# Patient Record
Sex: Female | Born: 1965 | Race: White | Hispanic: No | Marital: Single | Smoking: Never smoker
Health system: Southern US, Community
[De-identification: ages and names within clinical notes are randomized; demographics above are authoritative.]

## PROBLEM LIST (undated history)

## (undated) DIAGNOSIS — F329 Major depressive disorder, single episode, unspecified: Secondary | ICD-10-CM

## (undated) DIAGNOSIS — J329 Chronic sinusitis, unspecified: Secondary | ICD-10-CM

## (undated) DIAGNOSIS — F419 Anxiety disorder, unspecified: Secondary | ICD-10-CM

## (undated) DIAGNOSIS — F32A Depression, unspecified: Secondary | ICD-10-CM

## (undated) DIAGNOSIS — R51 Headache: Secondary | ICD-10-CM

## (undated) DIAGNOSIS — K219 Gastro-esophageal reflux disease without esophagitis: Secondary | ICD-10-CM

## (undated) DIAGNOSIS — Z9109 Other allergy status, other than to drugs and biological substances: Secondary | ICD-10-CM

## (undated) HISTORY — DX: Chronic sinusitis, unspecified: J32.9

## (undated) HISTORY — PX: HIP ARTHROSCOPY W/ LABRAL DEBRIDEMENT: SHX1749

## (undated) HISTORY — PX: OTHER SURGICAL HISTORY: SHX169

## (undated) HISTORY — DX: Headache: R51

## (undated) HISTORY — DX: Gastro-esophageal reflux disease without esophagitis: K21.9

## (undated) HISTORY — DX: Depression, unspecified: F32.A

## (undated) HISTORY — DX: Anxiety disorder, unspecified: F41.9

## (undated) HISTORY — DX: Major depressive disorder, single episode, unspecified: F32.9

---

## 1997-09-13 ENCOUNTER — Emergency Department (HOSPITAL_COMMUNITY): Admission: EM | Admit: 1997-09-13 | Discharge: 1997-09-14 | Payer: Self-pay | Admitting: Internal Medicine

## 1998-05-28 ENCOUNTER — Other Ambulatory Visit: Admission: RE | Admit: 1998-05-28 | Discharge: 1998-05-28 | Payer: Self-pay | Admitting: Obstetrics and Gynecology

## 1998-08-13 ENCOUNTER — Emergency Department (HOSPITAL_COMMUNITY): Admission: EM | Admit: 1998-08-13 | Discharge: 1998-08-13 | Payer: Self-pay | Admitting: Endocrinology

## 1998-08-13 ENCOUNTER — Encounter: Payer: Self-pay | Admitting: Endocrinology

## 1999-09-19 ENCOUNTER — Other Ambulatory Visit: Admission: RE | Admit: 1999-09-19 | Discharge: 1999-09-19 | Payer: Self-pay | Admitting: *Deleted

## 2000-05-21 ENCOUNTER — Emergency Department (HOSPITAL_COMMUNITY): Admission: EM | Admit: 2000-05-21 | Discharge: 2000-05-21 | Payer: Self-pay | Admitting: Emergency Medicine

## 2000-08-23 ENCOUNTER — Encounter: Payer: Self-pay | Admitting: Internal Medicine

## 2000-08-23 ENCOUNTER — Encounter: Admission: RE | Admit: 2000-08-23 | Discharge: 2000-08-23 | Payer: Self-pay | Admitting: Internal Medicine

## 2000-10-25 ENCOUNTER — Encounter: Payer: Self-pay | Admitting: Internal Medicine

## 2000-10-25 ENCOUNTER — Ambulatory Visit (HOSPITAL_COMMUNITY): Admission: RE | Admit: 2000-10-25 | Discharge: 2000-10-25 | Payer: Self-pay | Admitting: Internal Medicine

## 2000-10-26 ENCOUNTER — Ambulatory Visit (HOSPITAL_COMMUNITY): Admission: RE | Admit: 2000-10-26 | Discharge: 2000-10-26 | Payer: Self-pay | Admitting: Internal Medicine

## 2000-10-26 ENCOUNTER — Encounter: Payer: Self-pay | Admitting: Internal Medicine

## 2000-12-07 ENCOUNTER — Other Ambulatory Visit: Admission: RE | Admit: 2000-12-07 | Discharge: 2000-12-07 | Payer: Self-pay | Admitting: Obstetrics and Gynecology

## 2002-01-01 ENCOUNTER — Other Ambulatory Visit: Admission: RE | Admit: 2002-01-01 | Discharge: 2002-01-01 | Payer: Self-pay | Admitting: Obstetrics and Gynecology

## 2002-12-30 ENCOUNTER — Encounter: Admission: RE | Admit: 2002-12-30 | Discharge: 2002-12-30 | Payer: Self-pay | Admitting: Orthopaedic Surgery

## 2003-03-06 ENCOUNTER — Other Ambulatory Visit: Admission: RE | Admit: 2003-03-06 | Discharge: 2003-03-06 | Payer: Self-pay | Admitting: Obstetrics and Gynecology

## 2003-08-16 ENCOUNTER — Emergency Department (HOSPITAL_COMMUNITY): Admission: EM | Admit: 2003-08-16 | Discharge: 2003-08-17 | Payer: Self-pay | Admitting: *Deleted

## 2004-10-13 ENCOUNTER — Ambulatory Visit: Payer: Self-pay | Admitting: Internal Medicine

## 2004-11-22 ENCOUNTER — Ambulatory Visit: Payer: Self-pay | Admitting: Internal Medicine

## 2005-03-29 ENCOUNTER — Ambulatory Visit: Payer: Self-pay | Admitting: Internal Medicine

## 2005-04-03 ENCOUNTER — Encounter: Payer: Self-pay | Admitting: Internal Medicine

## 2005-04-03 ENCOUNTER — Ambulatory Visit: Payer: Self-pay | Admitting: Cardiology

## 2005-10-27 ENCOUNTER — Ambulatory Visit: Payer: Self-pay | Admitting: Internal Medicine

## 2006-01-29 ENCOUNTER — Ambulatory Visit: Payer: Self-pay | Admitting: Family Medicine

## 2006-08-24 ENCOUNTER — Ambulatory Visit: Payer: Self-pay | Admitting: Internal Medicine

## 2006-08-24 DIAGNOSIS — R519 Headache, unspecified: Secondary | ICD-10-CM | POA: Insufficient documentation

## 2006-08-24 DIAGNOSIS — F419 Anxiety disorder, unspecified: Secondary | ICD-10-CM | POA: Insufficient documentation

## 2006-08-24 DIAGNOSIS — F329 Major depressive disorder, single episode, unspecified: Secondary | ICD-10-CM

## 2006-08-24 DIAGNOSIS — R51 Headache: Secondary | ICD-10-CM | POA: Insufficient documentation

## 2006-08-24 DIAGNOSIS — F32A Depression, unspecified: Secondary | ICD-10-CM | POA: Insufficient documentation

## 2006-08-24 DIAGNOSIS — F411 Generalized anxiety disorder: Secondary | ICD-10-CM | POA: Insufficient documentation

## 2006-08-24 DIAGNOSIS — J019 Acute sinusitis, unspecified: Secondary | ICD-10-CM | POA: Insufficient documentation

## 2006-09-18 ENCOUNTER — Telehealth: Payer: Self-pay | Admitting: Internal Medicine

## 2006-09-28 ENCOUNTER — Telehealth: Payer: Self-pay | Admitting: Internal Medicine

## 2006-10-02 DIAGNOSIS — T7840XA Allergy, unspecified, initial encounter: Secondary | ICD-10-CM | POA: Insufficient documentation

## 2006-10-04 ENCOUNTER — Telehealth (INDEPENDENT_AMBULATORY_CARE_PROVIDER_SITE_OTHER): Payer: Self-pay | Admitting: *Deleted

## 2006-11-30 ENCOUNTER — Ambulatory Visit: Payer: Self-pay | Admitting: Internal Medicine

## 2006-12-03 ENCOUNTER — Ambulatory Visit: Payer: Self-pay | Admitting: Internal Medicine

## 2006-12-03 LAB — CONVERTED CEMR LAB
IgE (Immunoglobulin E), Serum: 184.9 intl units/mL — ABNORMAL HIGH (ref 0.0–180.0)
IgE (Immunoglobulin E), Serum: 206.1 intl units/mL — ABNORMAL HIGH (ref 0.0–180.0)

## 2007-01-28 ENCOUNTER — Encounter: Payer: Self-pay | Admitting: Internal Medicine

## 2007-03-05 ENCOUNTER — Ambulatory Visit: Payer: Self-pay | Admitting: Internal Medicine

## 2007-03-06 DIAGNOSIS — K219 Gastro-esophageal reflux disease without esophagitis: Secondary | ICD-10-CM | POA: Insufficient documentation

## 2007-03-06 DIAGNOSIS — L509 Urticaria, unspecified: Secondary | ICD-10-CM | POA: Insufficient documentation

## 2007-04-04 ENCOUNTER — Ambulatory Visit: Payer: Self-pay | Admitting: Internal Medicine

## 2007-04-04 DIAGNOSIS — J310 Chronic rhinitis: Secondary | ICD-10-CM | POA: Insufficient documentation

## 2007-10-04 ENCOUNTER — Ambulatory Visit: Payer: Self-pay | Admitting: Internal Medicine

## 2007-10-04 DIAGNOSIS — L719 Rosacea, unspecified: Secondary | ICD-10-CM | POA: Insufficient documentation

## 2008-08-12 ENCOUNTER — Telehealth: Payer: Self-pay | Admitting: Internal Medicine

## 2008-09-15 ENCOUNTER — Ambulatory Visit: Payer: Self-pay | Admitting: Internal Medicine

## 2008-09-15 DIAGNOSIS — J328 Other chronic sinusitis: Secondary | ICD-10-CM | POA: Insufficient documentation

## 2009-02-18 ENCOUNTER — Ambulatory Visit: Payer: Self-pay | Admitting: Internal Medicine

## 2009-02-18 DIAGNOSIS — H9209 Otalgia, unspecified ear: Secondary | ICD-10-CM | POA: Insufficient documentation

## 2009-02-18 DIAGNOSIS — M1610 Unilateral primary osteoarthritis, unspecified hip: Secondary | ICD-10-CM | POA: Insufficient documentation

## 2009-02-18 DIAGNOSIS — M542 Cervicalgia: Secondary | ICD-10-CM | POA: Insufficient documentation

## 2009-04-02 ENCOUNTER — Telehealth: Payer: Self-pay | Admitting: *Deleted

## 2009-04-12 ENCOUNTER — Telehealth: Payer: Self-pay | Admitting: *Deleted

## 2009-06-03 ENCOUNTER — Encounter: Payer: Self-pay | Admitting: Internal Medicine

## 2010-01-31 ENCOUNTER — Ambulatory Visit
Admission: RE | Admit: 2010-01-31 | Discharge: 2010-01-31 | Payer: Self-pay | Source: Home / Self Care | Attending: Internal Medicine | Admitting: Internal Medicine

## 2010-02-22 NOTE — Assessment & Plan Note (Signed)
Summary: FU 1 MONTH///KP   Visit Type:  Follow-up PCP:  Panosh  Chief Complaint:  follow up.  History of Present Illness: Current Problems:  URTICARIA (ICD-708.9) CORONARY HEART DISEASE (ICD-V17.3) G E R D (ICD-530.81) ALLERGY (ICD-995.3) SINUSITIS, ACUTE (ICD-461.9) HEADACHE (ICD-784.0) DEPRESSION (ICD-311) ANXIETY (ICD-300.00)  Her IgE was elevated 184, but her general and food RAST panels were negative.  Nasal neb neo made her queasy last time and it sounds s if she may have hyperventilated. She's been trying Clarinex-D. She likes an otc phenylephrine product and I discussed decongestants with her. Ears wiull pop. some nasal congestion. No wheeze, no headache.         Current Allergies (reviewed today): ! SULFA CELEBREX (CELECOXIB) SULFAMETHOXAZOLE (SULFAMETHOXAZOLE)  Past Medical History:    Reviewed history from 03/05/2007 and no changes required:       Anxiety       Depression       Headache       Recurrent sinusitis       G E R D   Social History:    Patient never smoked.     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 No Comments Medications reviewed with patient ..................................................................Marland KitchenDarra Lis RMA  April 04, 2007 1:51 PM      Physical Exam  General:     normal appearance and healthy appearing.   Head:     normocephalic and atraumatic Eyes:     PERRLA/EOM intact; conjunctiva and sclera clear Ears:     TMs intact and clear with normal canals cerumen Nose:     no deformity, discharge, inflammation, or lesions Mouth:     no deformity or lesions Neck:     no  masses, thyromegaly, or abnormal cervical nodes Lungs:     clear bilaterally to auscultation and percussion Heart:     regular rate and rhythm, S1, S2 without murmurs, rubs, gallops, or clicks     Impression & Recommendations:  Problem # 1:  SINUSITIS, ACUTE (ICD-461.9) Assessment: Improved  The following medications were removed from the medication list:    Cefdinir 300 Mg Caps (Cefdinir) .[name]... 2 daily  Her updated medication list for this problem includes:    Fluticasone Propionate 50 Mcg/act Susp (Fluticasone propionate) .[name]... Take as directed   Problem # 2:  RHINITIS (ICD-472.0) Assessment: Unchanged allergic rhinitis with eustachian dysfunction and possible autonomic instability Orders: Est. Patient Level III (48546)   Problem # 3:  URTICARIA (ICD-708.9) Assessment: Improved  Medications Added to Medication List This Visit: 1)  Fish Oil  .... Take 1 tablet by mouth once a day 2)  A/b Otic 1.4-5.4 % Soln (Benzocaine-antipyrine) .[name].. 1-2 drops with cotton wick two times a day as needed   Patient Instructions: 1)  Please schedule a follow-up appointment as needed 2)  OK to use Neti Pot, phenylephrine as a decongestant, and a drying antihistamine like Claritin (or Claritin -D). 3)  Try Nasalcrom/  cromolyn nasal spray 4)  Try A-B Otic ear drop for ear ache as needed    Prescriptions: A/B OTIC 1.4-5.4 %  SOLN (BENZOCAINE-ANTIPYRINE) 1-2 drops with cotton wick two times a day as needed  #1 vial x prn   Entered and Authorized by:   Waymon Budge MD   Signed by:   Waymon Budge MD on 04/04/2007   Method used:   Electronically sent to ...       [facility]  [phone]*       [address]       [place], [place]  [address]       Ph: [phone] or [phone]       Fax: [phone]   RxID:   [id number]  ]

## 2010-02-22 NOTE — Progress Notes (Signed)
Summary: constipation and pain  Phone Note Call from Patient   Caller: Patient Call For: Madelin Headings MD Summary of Call: [phone]  Pt had hip surgery last Tuesday, and today is having severe cramping with constipation.  Problems urinating or moving bowels. Last BM was 04/05/2009.  Usually goes 2 x daily.  Feels pressure when urinating......Marland Kitchenfeeling like trickling.  Severe abdominal pain.  Hip surgery was repair of labral tear.  Cannot walk even with walker very well.  Sister is calling the Careers adviser.  Chills and hot flashes.  Does not have thermometer.[name]  Positve rigors.  Talked to surgeon and he wants pt to use a Dulcolax Supp, and call him back.  Advised that we will not get involved unless the surgeon wants [place] to.  Initial call taken by: Lynann Beaver CMA,  April 12, 2009 3:35 PM

## 2010-02-22 NOTE — Assessment & Plan Note (Signed)
Summary: SINUSITIS // RS   Vital Signs:  Patient profile:   45 year old female Menstrual status:  regular LMP:     02/04/2009 Weight:      126 pounds Temp:     98.6 degrees F oral Pulse rate:   60 / minute BP sitting:   110 / 70  (right arm) Cuff size:   regular  Vitals Entered By: Romualdo Bolk, CMA (AAMA) (February 18, 2009 8:21 AM) CC: Sinus pressure, bilateral ear pain x 1 month, rt side of jaw swollen, sinus drainage with vomiting. Coughing and congestion LMP (date): 02/04/2009 LMP - Character: light Menarche (age onset years): 10   Menses interval (days): 28 Menstrual flow (days): 2 Enter LMP: 02/04/2009   History of Present Illness: Terri [name] comesin today for  acute problem of above. BOthe ears are bothering for  a month knife feeling  like  poking in ears.    Ha in middle of head and then ears.  Different than her "sinus HAs"    increasing drainage    an vomited yesterday after coughing.  .   NO HA then .    Has seen Dr Suszanne Conners in past . rec ct but couldn't  afford this.  Has hx of TMJ   used  braces but    doc is no longer in practice. so  still an issues  uses a night  retainer however. Had some ? discharge weepy from ears .  no q tips . ears pop .    having  hip surgery   at [facility].     in March.  ( not a hip replacement) some congential issue and injury from ballet when younger.    Preventive Screening-Counseling & Management  Alcohol-Tobacco     Alcohol drinks/day: <1     Alcohol type: wine     Smoking Status: never  Caffeine-Diet-Exercise     Caffeine use/day: 1     Does Patient Exercise: yes     Type of exercise: running and wts     Times/week: 3  Current Medications (verified): 1)  Junel 1/20 1-20 Mg-Mcg Tabs (Norethindrone Acet-Ethinyl Est) 2)  Fish Oil .... Take 1 Tablet By Mouth Once A Day 3)  Singulair 10 Mg Tabs (Montelukast Sodium) .[name].. 1po Once Daily 4)  Doxycycline Hyclate 100 Mg Tabs (Doxycycline Hyclate) .[name].. 1 By Mouth Two Times A  Day For Perioral Dermatitis 5)  Flonase 50 Mcg/act Susp (Fluticasone Propionate) .... 2 Sprays in Each Nostril Daily 6)  Vitamin D 1000 Unit  Tabs (Cholecalciferol)  Allergies (verified): 1)  ! Sulfa 2)  Celebrex (Celecoxib)  Past History:  Past medical, surgical, family and social histories (including risk factors) reviewed, and no changes noted (except as noted below).  Past Medical History: Reviewed history from 10/04/2007 and no changes required. Anxiety Depression Headache Recurrent sinusitis G E R D    Past History:  Care Management: Dermatology: Thomasene Lot Gynecology: Henderson Cloud ENT: Suszanne Conners  Family History: Reviewed history from 03/05/2007 and no changes required. Coronary Heart Disease  Social History: Reviewed history from 10/04/2007 and no changes required. 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  The patient denies fever, weight loss, weight gain, vision loss, chest pain, prolonged cough, hemoptysis, abdominal pain, melena, hematochezia, severe indigestion/heartburn, transient blindness, abnormal bleeding, enlarged lymph nodes, and angioedema.  left hip   old injury  and  to have hip surgery [facility]  Physical Exam  General:  alert, well-developed, well-nourished, and well-hydrated.  congested  Head:  normocephalic and atraumatic.   Eyes:  vision grossly intact, pupils equal, and pupils round.   Ears:  tmx are grey and seem intaacdt   tortuous canals  no dx minimal wax   no redness  Nose:  no external deformity and no external erythema.  congested  2+  face tender  maxillary area  Mouth:  mild redness     no edema     crepitus  TMJ  area     Neck:  tender  right anterior    tnderness neck no mass but ? if  righ thyroid palpable  no sig adenopathy  Lungs:  Normal respiratory effort, chest expands symmetrically. Lungs are clear to auscultation, no crackles or wheezes.no  dullness.   Heart:  normal rate and regular rhythm.   Pulses:  pulses intact without delay   Extremities:  mild limp    Neurologic:  non focal  Skin:  turgor normal and color normal.   Cervical Nodes:  no posterior cervical adenopathy.  ? shoddy  ac area  Psych:  Oriented X3, normally interactive, and good eye contact.     Impression & Recommendations:  Problem # 1:  OTHER CHRONIC SINUSITIS (ICD-473.8)  ? Acute on chronic   unsure if antibiotic will help  cost a reason to delay ct scan.  and rx with steroid and antibiotic  The following medications were removed from the medication list:    Cefdinir 300 Mg Caps (Cefdinir) .[name]... 1 by mouth two times a day for sinusitis    Hydromet 5-1.5 Mg/62ml Syrp (Hydrocodone-homatropine) .[name]... 1-2 tsp by mouth q 4-6 hours as needed   for cough Her updated medication list for this problem includes:    Doxycycline Hyclate 100 Mg Tabs (Doxycycline hyclate) .[name]... 1 by mouth two times a day for perioral dermatitis    Flonase 50 Mcg/act Susp (Fluticasone propionate) .[name]... 2 sprays in each nostril daily    Cefdinir 300 Mg Caps (Cefdinir) .[name]... 1 by mouth two times a day for sinusitis  Orders: Prescription Created Electronically [phone])  Problem # 2:  EAR PAIN, BILATERAL (ICD-388.70) ? if related   eustachian tube plus TMJ The following medications were removed from the medication list:    Cefdinir 300 Mg Caps (Cefdinir) .[name]... 1 by mouth two times a day for sinusitis Her updated medication list for this problem includes:    Doxycycline Hyclate 100 Mg Tabs (Doxycycline hyclate) .[name]... 1 by mouth two times a day for perioral dermatitis    Cefdinir 300 Mg Caps (Cefdinir) .[name]... 1 by mouth two times a day for sinusitis  Problem # 3:  NECK PAIN (ICD-723.1)  and swelling   ? what swelling was          declining imaging today cause of cost but may need head imaging in future if not better.   Her updated medication list for this problem includes:     Tramadol-acetaminophen 37.5-325 Mg Tabs (Tramadol-acetaminophen) .[name]... 1 by mouth q 8 hours   as needed  pain  Problem # 4:  ARTHRITIS, HIP (ICD-716.95) Assessment: Comment Only  Complete Medication List: 1)  Junel 1/20 1-20 Mg-mcg Tabs (Norethindrone acet-ethinyl est) 2)  Fish Oil  .... Take 1 tablet by mouth once a day 3)  Singulair 10 Mg Tabs (Montelukast sodium) .[name].. 1po once  daily 4)  Doxycycline Hyclate 100 Mg Tabs (Doxycycline hyclate) .[name].. 1 by mouth two times a day for perioral dermatitis 5)  Flonase 50 Mcg/act Susp (Fluticasone propionate) .... 2 sprays in each nostril daily 6)  Vitamin D 1000 Unit Tabs (Cholecalciferol) 7)  Prednisone 20 Mg Tabs (Prednisone) .[name].. 1 by mouth two times a day for 3-5 days or as directed 8)  Cefdinir 300 Mg Caps (Cefdinir) .[name].. 1 by mouth two times a day for sinusitis 9)  Tramadol-acetaminophen 37.5-325 Mg Tabs (Tramadol-acetaminophen) .[name].. 1 by mouth q 8 hours   as needed  pain  Patient Instructions: 1)  take cortisone and antibiotic  .   2)  I think some of your ear pain is from your jaw .    3)  Continue your sprays. 4)  Consider see Dr Suszanne Conners is not better .   Prescriptions: CEFDINIR 300 MG CAPS (CEFDINIR) 1 by mouth two times a day for sinusitis  #20 x 0   Entered and Authorized by:   Madelin Headings MD   Signed by:   Madelin Headings MD on 02/18/2009   Method used:   Electronically to        [facility]  [phone][facility] (retail)       [address] [place], [place]  [address]       Ph: [phone] or [phone]       Fax: [phone]   RxID:   [id number] PREDNISONE 20 MG TABS (PREDNISONE) 1 by mouth two times a day for 3-5 days or as directed  #15 x 0   Entered and Authorized by:   Madelin Headings MD   Signed by:   Madelin Headings MD on 02/18/2009   Method used:   Electronically to        [facility]  [phone]* (retail)       [address] [place], [place]  [address]       Ph: [phone] or [phone]        Fax: [phone]   RxID:   [id number]

## 2010-02-22 NOTE — Letter (Signed)
Summary: [facility]  [facility]   Imported By: Maryln Gottron 06/18/2009 15:29:55  _____________________________________________________________________  External Attachment:    Type:   Image     Comment:   External Document

## 2010-02-22 NOTE — Progress Notes (Signed)
Summary: anxiety  Phone Note Call from Patient   Caller: Patient Call For: Madelin Headings MD Summary of Call: Pt is having hip surgery at [facility] on Tuesday, and is having severe anxiety, and would like a RX.  The surgeons suggested she call her primary MD. [phone] Initial call taken by: Lynann Beaver CMA,  April 02, 2009 1:34 PM  Follow-up for Phone Call        ok to take xanax  .25 mg  1 by mouth three times a day as needed anxiety  . Disp  15 # no refills Follow-up by: Madelin Headings MD,  April 02, 2009 5:16 PM  Additional Follow-up for Phone Call Additional follow up Details #1::        Rx called in and left a message on machine that rx was called in. Additional Follow-up by: Romualdo Bolk, CMA (AAMA),  April 02, 2009 5:23 PM    New/Updated Medications: ALPRAZOLAM 0.25 MG TABS (ALPRAZOLAM) 1 by mouth three times a day as needed Prescriptions: ALPRAZOLAM 0.25 MG TABS (ALPRAZOLAM) 1 by mouth three times a day as needed  #15 x 0   Entered by:   Romualdo Bolk, CMA (AAMA)   Authorized by:   Madelin Headings MD   Signed by:   Romualdo Bolk, CMA (AAMA) on 04/02/2009   Method used:   Telephoned to ...       [facility]  [phone]* (retail)       [address] [place], [place]  [address]       Ph: [phone] or [phone]       Fax: [phone]   RxID:   [id number]

## 2010-02-24 NOTE — Assessment & Plan Note (Signed)
Summary: sinus inf/cjr   Vital Signs:  Patient profile:   45 year old female Menstrual status:  regular Pulse rate:   64 / minute BP sitting:   102 / 80  (left arm)  Vitals Entered By: Kyung Rudd, CMA (January 31, 2010 11:00 AM) CC: ??sinus inf   Primary Care Provider:  Panosh  CC:  ??sinus inf.  History of Present Illness: Patient presents to clinic as a workin for evaluation of possible sinus infection.  Had 2wk h/o URI with improved symptoms. Now with increasing nasal congestion and drainage, cough prod for clear sputum and left maxilary and frontal sinus pain/pressure. +teeth pain. H/o recurrent sinusitis. Denies fever/chills, sick exposure, dyspnea or wheezing. No alleviating or exacerbating factors.  Current Medications (verified): 1)  Junel 1/20 1-20 Mg-Mcg Tabs (Norethindrone Acet-Ethinyl Est) 2)  Fish Oil .... Take 1 Tablet By Mouth Once A Day 3)  Singulair 10 Mg Tabs (Montelukast Sodium) .[name].. 1po Once Daily 4)  Doxycycline Hyclate 100 Mg Tabs (Doxycycline Hyclate) .[name].. 1 By Mouth Two Times A Day For Perioral Dermatitis 5)  Flonase 50 Mcg/act Susp (Fluticasone Propionate) .... 2 Sprays in Each Nostril Daily 6)  Vitamin D 1000 Unit  Tabs (Cholecalciferol)  Allergies (verified): 1)  ! Sulfa 2)  Celebrex (Celecoxib)  Past History:  Past medical, surgical, family and social histories (including risk factors) reviewed, and no changes noted (except as noted below).  Past Medical History: Reviewed history from 10/04/2007 and no changes required. Anxiety Depression Headache Recurrent sinusitis G E R D    Family History: Reviewed history from 03/05/2007 and no changes required. Coronary Heart Disease  Social History: Reviewed history from 10/04/2007 and no changes required. 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 No apparent alopecia or balding. Eyes:  No corneal or conjunctival inflammation noted. EOMI.  Ears:  External ear exam shows no significant lesions or deformities.  Otoscopic examination reveals clear canals, tympanic membranes are intact bilaterally without bulging, retraction, inflammation or discharge. Hearing is grossly normal bilaterally. Nose:  External nasal examination shows no deformity or inflammation. Nasal mucosa are pink and moist without lesions or exudates. Mouth:  Oral mucosa and oropharynx without lesions or exudates.  Teeth in good repair. Neck:  No deformities, masses, or tenderness noted. Lungs:  Normal respiratory effort, chest expands symmetrically. Lungs are clear to auscultation, no crackles or wheezes. Heart:  Normal rate and regular rhythm. S1 and S2 normal without gallop, murmur, click, rub or other extra sounds. Skin:  turgor normal, color normal, and no rashes.     Impression & Recommendations:  Problem # 1:  SINUSITIS, ACUTE (ICD-461.9) Begin course of levaquin to completion. Followup if no improvement or worsening.  The following medications were removed from the medication list:    Cefdinir 300 Mg Caps (Cefdinir) .[name]... 1 by mouth two times a day for sinusitis Her updated medication list for this problem includes:    Doxycycline Hyclate 100 Mg Tabs (Doxycycline hyclate) .[name]... 1 by mouth two times a day for perioral dermatitis    Flonase 50 Mcg/act Susp (Fluticasone propionate) .[name]... 2 sprays in each nostril daily    Levaquin 500 Mg Tabs (Levofloxacin) ..... One by mouth qd  Complete Medication List: 1)  Junel 1/20 1-20 Mg-mcg  Tabs (Norethindrone acet-ethinyl est) 2)  Fish Oil  .... Take 1 tablet by mouth once a day 3)  Singulair 10 Mg Tabs (Montelukast sodium) .[name].. 1po once daily 4)  Doxycycline Hyclate 100 Mg Tabs (Doxycycline hyclate)  .[name].. 1 by mouth two times a day for perioral dermatitis 5)  Flonase 50 Mcg/act Susp (Fluticasone propionate) .... 2 sprays in each nostril daily 6)  Vitamin D 1000 Unit Tabs (Cholecalciferol) 7)  Levaquin 500 Mg Tabs (Levofloxacin) .... One by mouth qd Prescriptions: LEVAQUIN 500 MG TABS (LEVOFLOXACIN) one by mouth qd  #10 x 0   Entered and Authorized by:   Edwyna Perfect MD   Signed by:   Edwyna Perfect MD on 01/31/2010   Method used:   Electronically to        [facility]  [phone]* (retail)       [address] [place], [place]  [address]       Ph: [phone] or [phone]       Fax: [phone]   RxID:   [id number]    Orders Added: 1)  Est. Patient Level III [[address]]

## 2010-06-07 NOTE — Assessment & Plan Note (Signed)
[facility]                             PULMONARY OFFICE NOTE   Terri [name], Terri [name]                      MRN:          [id number]  DATE:11/30/2006                            DOB:          [birth year]    PROBLEM:  Allergy evaluation for this 45 year old woman at the kind  request of Dr. Fabian Sharp. She complains of sinus headaches and nasal  congestion.   HISTORY:  She has been treated for years with a diagnosis of sinus,  but says medicines never helped. Bothersome post-nasal drainage, but she  realizes some of this is definitely reflux. She complains of retro-  orbital headaches and nasal congestion. Allergy work up 6 to 7 years ago  including skin testing tried twice. Both times, she says that she  passed out and never followed through. She tends to be worse in the  spring and fall, especially then, her eyes will itch. Leaf mold and  house dust seem to bother her. She has been using Flonase and saline  lavage which definitely help.   MEDICATIONS:  1. NuvaRing.  2. Flonase 2 sprays each nostril.   Drug intolerant SULFA with hives and throat swelling.   REVIEW OF SYSTEMS:  Cough, sometimes productive. Acid indigestion,  reflux, sore throats, sinus headaches, nasal congestion, itching eyes.   PAST HISTORY:  Hiatal hernia with reflux, recurrent allergies, chronic  sinus headaches, dermographism, SULFA drugs. There is no history of  intolerance to latex, contrast dye, or aspirin. She says that she does  not want to take prednisone again.   SOCIAL HISTORY:  Never smoked. Two alcoholic drinks per week. She is not  married, but has a live-in companion. 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 They use a skin abrasion machine and  one of the grinding dusts bother her airways enough that she wears a  mask to avoid it. She has encasings on her pillow. She has  noticed that  house dust increases nasal congestion.   FAMILY HISTORY:  Father had MI.   REVIEW OF SYSTEMS:  Weight 140 pounds, blood pressure 108/68, pulse 77,  room air saturation 99%. Well developed, well nourished. Conjunctivae  are clear. She is wearing cataracts. Skin shows dermographism. Nasal  airway and throat are clear. Chest is clear. Heart sounds are normal.   IMPRESSION:  1. Complaint of headache which is non-specific.  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  3. Allergic rhinitis. Her history of barriers including especially the      dermographism suggests that we should not skin test her now. This      is reinforced by her history that she had fainted twice when skin      testing was attempted in the past.   PLAN:  We are drawing blood for RAST panels. Schedule return 3 to 6  weeks as able.     Clinton D. Maple Hudson, MD, Tonny Bollman, FACP  Electronically Signed    CDY/MedQ  DD: 11/30/2006  DT: 12/02/2006  Job #: [id number]   cc:   [name]. Fabian Sharp, MD

## 2010-08-17 ENCOUNTER — Encounter: Payer: Self-pay | Admitting: Internal Medicine

## 2010-08-22 ENCOUNTER — Ambulatory Visit: Payer: Self-pay | Admitting: Internal Medicine

## 2010-09-27 ENCOUNTER — Other Ambulatory Visit: Payer: Self-pay | Admitting: Internal Medicine

## 2010-09-28 ENCOUNTER — Encounter: Payer: Self-pay | Admitting: Internal Medicine

## 2010-09-28 ENCOUNTER — Ambulatory Visit (INDEPENDENT_AMBULATORY_CARE_PROVIDER_SITE_OTHER): Payer: BC Managed Care – PPO | Admitting: Internal Medicine

## 2010-09-28 VITALS — BP 100/70 | HR 60 | Temp 98.8°F | Wt 123.0 lb

## 2010-09-28 DIAGNOSIS — J019 Acute sinusitis, unspecified: Secondary | ICD-10-CM

## 2010-09-28 DIAGNOSIS — J329 Chronic sinusitis, unspecified: Secondary | ICD-10-CM | POA: Insufficient documentation

## 2010-09-28 MED ORDER — CEFDINIR 300 MG PO CAPS: 300.0000 mg | ORAL_CAPSULE | Freq: Two times a day (BID) | ORAL | Status: AC

## 2010-09-28 MED ORDER — FLUTICASONE PROPIONATE 50 MCG/ACT NA SUSP: 2.0000 | Freq: Every day | NASAL | Status: DC

## 2010-09-28 NOTE — Progress Notes (Signed)
  Subjective:    Patient ID: Terri [name], female    DOB: 09/03/1965, 45 y.o.   MRN: [id number]  HPI Patient comes in today for an acute problem. She's had a recurrence of her sinus drainage problem. She's been battling this for one to 2 months with saline washes and her Flonase which she is out of it like a refill. She is now having continued green drainage change in the odor of her breath and some anterior face discomfort.     She believes that there is something in her house she is sensitive to because when she leaves the home  for long periods of time she has no flareup of her sinusitis. He has had a couple things tested and is still working on what could be doing this.    Of note she has had a major investigation in the past her pulmonary and ENT and we have managed her recurrent problem over time.   She does get GI upset with Augmentin her last treatment with Omnicef Review of Systems No fever chest pain shortness of breath does have an occasional cough with some bloody nasal drainage.  Past history family history social history reviewed in the electronic medical record.     Objective:   Physical Exam WDWN in nad sounds congested HEENT: Normocephalic ;atraumatic , Eyes;  PERRL, EOMs  Full, lids and conjunctiva clear,,Ears: no deformities, canals nl, TM landmarks normal, Nose: no deformity  quite congested right face slightly tender  Mouth : OP clear without lesion or edema . Units tracks seen Neck no masses  Chest:  Clear to A&P without wheezes rales or rhonchi CV:  S1-S2 no gallops or murmurs peripheral perfusion is normal No clubbing cyanosis or edema      Assessment & Plan:  Recurrent sinusitis subacute and acute  Possibly underlying external trigger allergic versus irritative  . Good about using sinus hygiene and avoidance. It is reasonable to treat her again with antibiotics and she is also leaving town soon.  Refill Flonase . rec antibiotics followup if recurrent and  persistent.  If gets recurrent yeast infection call in so we can prescribe some Diflucan. She apparently has some topical irritation with over the counters.

## 2010-09-28 NOTE — Patient Instructions (Signed)
Call if needed  Treatment for yeast .  Antibiotic  For now.

## 2010-10-03 ENCOUNTER — Telehealth: Payer: Self-pay | Admitting: *Deleted

## 2010-10-03 NOTE — Telephone Encounter (Addendum)
Pt has no fever, or blood in her stools, but stopped Omnicef on Saturday.  Do you still want her to try the Probiotic?  She states the diarrhea was pretty severe and turned to clear liquids by the time she stopped the [facility].

## 2010-10-03 NOTE — Telephone Encounter (Signed)
Pt is having diarrhea from the [facility], and needs to know if she should stop or change antibiotics.

## 2010-10-03 NOTE — Telephone Encounter (Signed)
Per Dr. Fabian Sharp- have pt to just try the probiotic for now then call us in a few days if this doesn't help.

## 2010-10-03 NOTE — Telephone Encounter (Signed)
Pt.notified

## 2010-10-03 NOTE — Telephone Encounter (Signed)
Per Dr. Fabian Sharp- call pt and ask her if she has a fever or any blood in her stools. If not, have her try a probiotic to see if this helps.

## 2010-10-05 ENCOUNTER — Telehealth: Payer: Self-pay | Admitting: *Deleted

## 2010-10-05 MED ORDER — FLUCONAZOLE 150 MG PO TABS: 150.0000 mg | ORAL_TABLET | Freq: Once | ORAL | Status: AC

## 2010-10-05 NOTE — Telephone Encounter (Signed)
Per Dr. Fabian Sharp- ok to do diflucan 150mg  x 1. Rx sent to pharmacy.

## 2010-10-05 NOTE — Telephone Encounter (Signed)
Needs Diflucan for yeast infection caused by recent antibiotics.

## 2010-12-01 ENCOUNTER — Emergency Department (INDEPENDENT_AMBULATORY_CARE_PROVIDER_SITE_OTHER): Payer: BC Managed Care – PPO

## 2010-12-01 ENCOUNTER — Emergency Department (INDEPENDENT_AMBULATORY_CARE_PROVIDER_SITE_OTHER)
Admission: EM | Admit: 2010-12-01 | Discharge: 2010-12-01 | Disposition: A | Discharge status: Home / Self Care | Payer: BC Managed Care – PPO | Source: Home / Self Care | Attending: Family Medicine | Admitting: Family Medicine

## 2010-12-01 ENCOUNTER — Encounter (HOSPITAL_COMMUNITY): Payer: Self-pay | Admitting: *Deleted

## 2010-12-01 ENCOUNTER — Telehealth: Payer: Self-pay | Admitting: *Deleted

## 2010-12-01 DIAGNOSIS — J329 Chronic sinusitis, unspecified: Secondary | ICD-10-CM

## 2010-12-01 DIAGNOSIS — J40 Bronchitis, not specified as acute or chronic: Secondary | ICD-10-CM

## 2010-12-01 HISTORY — DX: Other allergy status, other than to drugs and biological substances: Z91.09

## 2010-12-01 MED ORDER — DOXYCYCLINE HYCLATE 100 MG PO CAPS: 100.0000 mg | ORAL_CAPSULE | Freq: Two times a day (BID) | ORAL | Status: AC

## 2010-12-01 MED ORDER — BENZONATATE 100 MG PO CAPS: ORAL_CAPSULE | ORAL | Status: DC

## 2010-12-01 NOTE — ED Provider Notes (Signed)
History     CSN: [id number] Arrival date & time: 12/01/2010  8:26 PM   First MD Initiated Contact with Patient 12/01/10 2139      Chief Complaint  Patient presents with  . Cough    N/V    (Consider location/radiation/quality/duration/timing/severity/associated sxs/prior treatment) HPI Comments: Pt states she initially had some sinus congestion approx 3 weeks ago but improved after a few days. Then became ill with high fever, cough, nasal congestion and body aches. Fever resolved after 3 days and was beginning to feel better. Then 4 or 5 days ago sinus congestion, post nasal drainage and cough worsened again. Yellow/green nasal mucus. Cough is sometimes productive. Awoke during the night last night feeling like she couldn't breathe because of all the mucus and congestion in her throat. When she began to cough she vomited and then coughed up some blood. She has had some streaks of blood in her sputum today also. No recent travel.   Patient is a 45 y.o. female presenting with cough.  Cough This is a new problem. The current episode started more than 1 week ago. The problem has been gradually worsening. The cough is productive of sputum. There has been no fever. Associated symptoms include rhinorrhea and sore throat. Pertinent negatives include no chest pain, no chills, no ear congestion, no ear pain, no shortness of breath and no wheezing. She has tried nothing for the symptoms. She is not a smoker. Her past medical history does not include asthma.    Past Medical History  Diagnosis Date  . Anxiety   . Depression   . GERD (gastroesophageal reflux disease)   . Headache   . Recurrent sinusitis     ent and pulm eval in past.  . Environmental allergies     Past Surgical History  Procedure Date  . Left hip surgery     [facility]  . Hip arthroscopy w/ labral debridement     Family History  Problem Relation Age of Onset  . Heart disease      fhx    History  Substance Use Topics  .  Smoking status: Never Smoker   . Smokeless tobacco: Not on file  . Alcohol Use: Yes     Occasional use    OB History    Grav Para Term Preterm Abortions TAB SAB Ect Mult Living                  Review of Systems  Constitutional: Negative for fever, chills and fatigue.  HENT: Positive for congestion, sore throat, rhinorrhea, postnasal drip and sinus pressure. Negative for ear pain and sneezing.   Respiratory: Positive for cough. Negative for shortness of breath and wheezing.   Cardiovascular: Negative for chest pain and palpitations.    Allergies  Celecoxib and Sulfonamide derivatives  Home Medications   Current Outpatient Rx  Name Route Sig Dispense Refill  . COENZYME Q10 30 MG PO CAPS Oral Take 30 mg by mouth 3 (three) times daily.      [name] VITAMIN D 1000 UNITS PO CAPS Oral Take 1,000 Units by mouth daily.      [name] VITAMIN B 12 PO Oral Take by mouth.      . OMEGA-3 FATTY ACIDS 1000 MG PO CAPS Oral Take 1 g by mouth daily.      [name] FLUTICASONE PROPIONATE 50 MCG/ACT NA SUSP Nasal Place 2 sprays into the nose daily. 1 Act 12  . NORETHIN ACE-ETH ESTRAD-FE 1-20 MG-MCG PO TABS Oral Take  1 tablet by mouth daily.        BP 112/66  Pulse 83  Temp(Src) 99.1 F (37.3 C) (Oral)  SpO2 97%  LMP 11/17/2010  Physical Exam  Nursing note and vitals reviewed. Constitutional: She appears well-developed and well-nourished. No distress.  HENT:  Head: Normocephalic and atraumatic.  Right Ear: Tympanic membrane, external ear and ear canal normal.  Left Ear: Tympanic membrane, external ear and ear canal normal.  Nose: No rhinorrhea. Right sinus exhibits frontal sinus tenderness. Right sinus exhibits no maxillary sinus tenderness. Left sinus exhibits frontal sinus tenderness. Left sinus exhibits no maxillary sinus tenderness.  Mouth/Throat: Uvula is midline, oropharynx is clear and moist and mucous membranes are normal. No oropharyngeal exudate, posterior oropharyngeal edema or posterior  oropharyngeal erythema.  Neck: Neck supple.  Cardiovascular: Normal rate, regular rhythm and normal heart sounds.   Pulmonary/Chest: Effort normal and breath sounds normal. No respiratory distress.  Lymphadenopathy:    She has no cervical adenopathy.  Neurological: She is alert.  Skin: Skin is warm and dry.  Psychiatric: She has a normal mood and affect.    ED Course  Procedures (including critical care time)  Labs Reviewed - No data to display No results found.   No diagnosis found.    MDM  Xray neg, reviewed by radiologist.        Melody Comas, PA 12/01/10 2250

## 2010-12-01 NOTE — Telephone Encounter (Signed)
Terri [name] call her up as FU tomorrow.

## 2010-12-01 NOTE — Telephone Encounter (Signed)
Coughing up blood with green phlegm, no fever, did have chills last night, she is having sob, chest is tightness. Singulair is not helping. Pt can't come in now. Pt can't come in until 12pm. Pt thought about calling 911 last night because of the sob but waited. Per Dr. Tawanna Cooler- Can go to to [facility] for a cxr then come over here and wait to be seen. It may be an hour or so wait. Or she could go to [facility] to be seen. Pt states that she will go to [facility] to be seen this afternoon.

## 2010-12-01 NOTE — ED Notes (Signed)
Reports had flu-like sxs w/ fever up to 104 x 3 days approx 3 wks ago - got better, then started 8 days ago w/ productive cough w/ "greenish, rusty, brown" sputum.  C/O some nausea; had 1 episode vomiting today.  Feels like "I can't catch my breath".  Denies fevers.  Had episode last night w/ extreme SOB after laying down - felt better after vomiting and spitting up large amt sputum.  Has been taking Nyquil and Alka Seltzer.

## 2010-12-02 ENCOUNTER — Ambulatory Visit: Payer: BC Managed Care – PPO | Admitting: Internal Medicine

## 2010-12-02 NOTE — Telephone Encounter (Signed)
Pt called back saying that she does want to cancel her appt today. Appt cancelled.

## 2010-12-02 NOTE — Telephone Encounter (Signed)
Left message to call back to see how she is doing and if she wants to cancel her 4pm appt today.

## 2010-12-02 NOTE — ED Provider Notes (Signed)
Medical screening examination/treatment/procedure(s) were performed by non-physician practitioner and as supervising physician I was immediately available for consultation/collaboration.  Corrie Mckusick, MD 12/02/10 2127

## 2011-04-18 ENCOUNTER — Telehealth: Payer: Self-pay | Admitting: Internal Medicine

## 2011-04-18 MED ORDER — VALACYCLOVIR HCL 1 G PO TABS: 1000.0000 mg | ORAL_TABLET | Freq: Two times a day (BID) | ORAL | Status: DC

## 2011-04-18 NOTE — Telephone Encounter (Signed)
Left message to call back  

## 2011-04-18 NOTE — Telephone Encounter (Signed)
Addended by: Azucena Freed on: 04/18/2011 05:38 PM   Modules accepted: Orders

## 2011-04-18 NOTE — Telephone Encounter (Signed)
VM:  Pt states she needs an rx for a fever blister and she is not sure if she got it from Dr. Fabian Sharp or another doctor about 5 years ago.  Pt states she thinks the medication is acyclovir.  Pt is going out of town on tomorrow and cannot be seen.  Pt would like for rx to be sent to [facility].

## 2011-04-18 NOTE — Telephone Encounter (Signed)
Patient called stating that she has a fever blister and she would like to have something called into her pharmacy for it, preferably the same one as in the past as she is going out of town tomorrow. Please advise.

## 2011-04-18 NOTE — Telephone Encounter (Signed)
Per Dr. Fabian Sharp ok to send in Valtrex 1000 mg take 2 bid at onset.  Pt aware.

## 2011-08-24 ENCOUNTER — Ambulatory Visit (INDEPENDENT_AMBULATORY_CARE_PROVIDER_SITE_OTHER): Payer: BC Managed Care – PPO | Admitting: Internal Medicine

## 2011-08-24 ENCOUNTER — Encounter: Payer: Self-pay | Admitting: Internal Medicine

## 2011-08-24 VITALS — BP 102/70 | Temp 98.7°F | Wt 123.0 lb

## 2011-08-24 DIAGNOSIS — J019 Acute sinusitis, unspecified: Secondary | ICD-10-CM

## 2011-08-24 MED ORDER — AMOXICILLIN-POT CLAVULANATE 875-125 MG PO TABS: 1.0000 | ORAL_TABLET | Freq: Two times a day (BID) | ORAL | Status: DC

## 2011-08-24 NOTE — Patient Instructions (Signed)
Take your antibiotic as prescribed until ALL of it is gone, but stop if you develop a rash, swelling, or any side effects of the medication.  Contact our office as soon as possible if  there are side effects of the medication.Sinusitis Sinuses are air pockets within the bones of your face. The growth of bacteria within a sinus leads to infection. The infection prevents the sinuses from draining. This infection is called sinusitis. SYMPTOMS   There will be different areas of pain depending on which sinuses have become infected.  The maxillary sinuses often produce pain beneath the eyes.   Frontal sinusitis may cause pain in the middle of the forehead and above the eyes.  Other problems (symptoms) include:  Toothaches.   Colored, pus-like (purulent) drainage from the nose.   Swelling, warmth, and tenderness over the sinus areas may be signs of infection.  TREATMENT   Sinusitis is most often determined by an exam.X-rays may be taken. If x-rays have been taken, make sure you obtain your results or find out how you are to obtain them. Your caregiver may give you medications (antibiotics). These are medications that will help kill the bacteria causing the infection. You may also be given a medication (decongestant) that helps to reduce sinus swelling.   HOME CARE INSTRUCTIONS    Only take over-the-counter or prescription medicines for pain, discomfort, or fever as directed by your caregiver.   Drink extra fluids. Fluids help thin the mucus so your sinuses can drain more easily.   Applying either moist heat or ice packs to the sinus areas may help relieve discomfort.   Use saline nasal sprays to help moisten your sinuses. The sprays can be found at your local drugstore.  SEEK IMMEDIATE MEDICAL CARE IF:  You have a fever.   You have increasing pain, severe headaches, or toothache.   You have nausea, vomiting, or drowsiness.   You develop unusual swelling around the face or trouble seeing.    MAKE SURE YOU:    Understand these instructions.   Will watch your condition.   Will get help right away if you are not doing well or get worse.  Document Released: 01/09/2005 Document Revised: 12/29/2010 Document Reviewed: 08/08/2006 [facility] Patient Information 2012 [place], [place].

## 2011-08-24 NOTE — Progress Notes (Signed)
  Subjective:    Patient ID: Terri [name], female    DOB: 09/08/1965, 46 y.o.   MRN: [id number]  HPI  46 year old patient who does have a history of recurrent sinusitis in the past. She has had worsening sinus congestion and pain intermittently over the past 4 weeks. For the past 2 weeks symptoms have intensified with especially left-sided hemi facial discomfort. There's been no fever. She has been using Flonase but continues to have worsening discomfort. She also describes left ear discomfort    Review of Systems  Constitutional: Negative.   HENT: Positive for ear pain, congestion and sinus pressure. Negative for hearing loss, sore throat, rhinorrhea, dental problem and tinnitus.   Eyes: Negative for pain, discharge and visual disturbance.  Respiratory: Negative for cough and shortness of breath.   Cardiovascular: Negative for chest pain, palpitations and leg swelling.  Gastrointestinal: Negative for nausea, vomiting, abdominal pain, diarrhea, constipation, blood in stool and abdominal distention.  Genitourinary: Negative for dysuria, urgency, frequency, hematuria, flank pain, vaginal bleeding, vaginal discharge, difficulty urinating, vaginal pain and pelvic pain.  Musculoskeletal: Negative for joint swelling, arthralgias and gait problem.  Skin: Negative for rash.  Neurological: Positive for headaches. Negative for dizziness, syncope, speech difficulty, weakness and numbness.  Hematological: Negative for adenopathy.  Psychiatric/Behavioral: Negative for behavioral problems, dysphoric mood and agitation. The patient is not nervous/anxious.        Objective:   Physical Exam  Constitutional: She is oriented to person, place, and time. She appears well-developed and well-nourished.  HENT:  Head: Normocephalic.  Right Ear: External ear normal.  Left Ear: External ear normal.  Mouth/Throat: Oropharynx is clear and moist.       Left maxillary sinus discomfort  Eyes: Conjunctivae and EOM  are normal. Pupils are equal, round, and reactive to light.  Neck: Normal range of motion. Neck supple. No thyromegaly present.  Cardiovascular: Normal rate, regular rhythm, normal heart sounds and intact distal pulses.   Pulmonary/Chest: Effort normal and breath sounds normal.  Abdominal: Soft. Bowel sounds are normal. She exhibits no mass. There is no tenderness.  Musculoskeletal: Normal range of motion.  Lymphadenopathy:    She has no cervical adenopathy.  Neurological: She is alert and oriented to person, place, and time.  Skin: Skin is warm and dry. No rash noted.  Psychiatric: She has a normal mood and affect. Her behavior is normal.          Assessment & Plan:   Acute sinusitis. We'll continue fluticasone. Will treat with Avelox for 7 days; we'll continue tramadol and add decongestants and expectorants

## 2011-11-07 ENCOUNTER — Other Ambulatory Visit: Payer: Self-pay | Admitting: Internal Medicine

## 2011-11-21 ENCOUNTER — Ambulatory Visit: Payer: BC Managed Care – PPO | Admitting: Internal Medicine

## 2011-11-21 ENCOUNTER — Ambulatory Visit (INDEPENDENT_AMBULATORY_CARE_PROVIDER_SITE_OTHER): Payer: BC Managed Care – PPO | Admitting: Internal Medicine

## 2011-11-21 ENCOUNTER — Encounter: Payer: Self-pay | Admitting: Internal Medicine

## 2011-11-21 VITALS — BP 108/70 | HR 112 | Temp 98.6°F | Wt 116.0 lb

## 2011-11-21 DIAGNOSIS — E785 Hyperlipidemia, unspecified: Secondary | ICD-10-CM

## 2011-11-21 DIAGNOSIS — E782 Mixed hyperlipidemia: Secondary | ICD-10-CM | POA: Insufficient documentation

## 2011-11-21 DIAGNOSIS — T887XXA Unspecified adverse effect of drug or medicament, initial encounter: Secondary | ICD-10-CM | POA: Insufficient documentation

## 2011-11-21 DIAGNOSIS — Z8249 Family history of ischemic heart disease and other diseases of the circulatory system: Secondary | ICD-10-CM | POA: Insufficient documentation

## 2011-11-21 DIAGNOSIS — J329 Chronic sinusitis, unspecified: Secondary | ICD-10-CM

## 2011-11-21 MED ORDER — LEVOFLOXACIN 750 MG PO TABS: 750.0000 mg | ORAL_TABLET | Freq: Every day | ORAL | Status: DC

## 2011-11-21 NOTE — Progress Notes (Signed)
Chief Complaint  Patient presents with  . Headache    Ongoing for 1 month  . Nasal Congestion  . Sore Throat    HPI: Patient comes in today for SDA for  new problem evaluation. Onset about a month ago  .  Had steroid inj in back 3 weeks ago and had se of vomiting skin flushing poor sleep . Heart racing.   Helped the sinus slightly better  But still has ha and nose and face pressure.  Poss low grade temp.   Had avelox in august.    After a "bad infection" and worked very well  Has more problems in her residence and didn't bother her when was post op at her sisters residence.  ROS: See pertinent positives and negatives per HPI. Has hx of elevated lipids per gyne asks about this has family hx of HD and had palpitations with the steroid injection  .  No cp sob otherwise   Has numbness in both feet not felt from disc sciatic and to see neuro soon Dr Marjory Lies   Past Medical History  Diagnosis Date  . Anxiety   . Depression   . GERD (gastroesophageal reflux disease)   . Headache   . Recurrent sinusitis     ent and pulm eval in past.  . Environmental allergies     Family History  Problem Relation Age of Onset  . Heart disease      fhx    History   Social History  . Marital Status: Single    Spouse Name: N/A    Number of Children: N/A  . Years of Education: N/A   Social History Main Topics  . Smoking status: Never Smoker   . Smokeless tobacco: None  . Alcohol Use: Yes     Occasional use  . Drug Use: No  . Sexually Active: Yes    Birth Control/ Protection: Pill   Other Topics Concern  . None   Social History Narrative   estheticianNon smoker    Current outpatient prescriptions:B Complex Vitamins (VITAMIN-B COMPLEX PO), Take by mouth., Disp: , Rfl: ;  Calcium Carbonate-Vitamin D (CALCIUM-VITAMIN D) 500-200 MG-UNIT per tablet, Take 1 tablet by mouth 2 (two) times daily with a meal., Disp: , Rfl: ;  co-enzyme Q-10 30 MG capsule, Take 30 mg by mouth 3 (three) times  daily.  , Disp: , Rfl: ;  fish oil-omega-3 fatty acids 1000 MG capsule, Take 1 g by mouth daily.  , Disp: , Rfl:  folic acid (FOLVITE) 1 MG tablet, Take 1 mg by mouth daily., Disp: , Rfl: ;  norethindrone-ethinyl estradiol (JUNEL FE 1/20) 1-20 MG-MCG per tablet, Take 1 tablet by mouth daily.  , Disp: , Rfl: ;  traMADol-acetaminophen (ULTRACET) 37.5-325 MG per tablet, Take 1 tablet by mouth every 6 (six) hours as needed., Disp: , Rfl: ;  vitamin C (ASCORBIC ACID) 500 MG tablet, Take 500 mg by mouth daily., Disp: , Rfl:  fluticasone (FLONASE) 50 MCG/ACT nasal spray, PLACE 2 SPRAYS INTO THE NOSE DAILY., Disp: 16 g, Rfl: 2;  levofloxacin (LEVAQUIN) 750 MG tablet, Take 1 tablet (750 mg total) by mouth daily., Disp: 7 tablet, Rfl: 0;  valACYclovir (VALTREX) 1000 MG tablet, Take 1 tablet (1,000 mg total) by mouth 2 (two) times daily., Disp: 30 tablet, Rfl: 1  EXAM:  Filed Vitals:   11/21/11 1616  BP: 108/70  Pulse: 112  Temp: 98.6 F (37 C)  BP 108/70  Pulse 112  Temp 98.6 F (  37 C) (Oral)  Wt 116 lb (52.617 kg)  SpO2 98%  LMP 11/14/2011   There is no height on file to calculate BMI.  GENERAL: vitals reviewed and listed above, alert, oriented, appears well hydrated and in no acute distress congested   HEENT: atraumatic, conjunttiva clear, no obvious abnormalities on inspection of external nose and ears OP : no lesin edema or exudate  Nares congested and tendern ehtmoid and upper maxillary area    NECK: no obvious masses on inspection palpation  No adenopathy of import   LUNGS: clear to auscultation bilaterally, no wheezes, rales or rhonchi, good air movement  CV: HRRR, no clubbing cyanosis or  peripheral edema nl cap refill   MS: moves all extremities  Non focal motor  PSYCH: pleasant and cooperative, no obvious depression or anxiety reviewed labs scanned from a year ago tc 263 hdl 101 ldl 150 range  ASSESSMENT AND PLAN:  Discussed the following assessment and plan:  1. Recurrent  sinusitis   2. Other and unspecified hyperlipidemia    high hdl 4 ratio  3. Fam hx-ischem heart disease   4. Medication side effect    steroid back injection jittery and palpitations  has many ? Plan ROV after sinus infection better  Adv repeat lipids and thyroid tests. cpx panel  etc   -Patient advised to return or notify  immediately if symptoms worsen or persist or new concerns arise.  Patient Instructions  Ok to try the levaquin for 7 days if  persistent or progressive may need reevaluation .      arrange for fu about above    Lorretta Harp

## 2011-11-21 NOTE — Patient Instructions (Signed)
Ok to try the levaquin for 7 days if  persistent or progressive may need reevaluation .

## 2012-01-02 ENCOUNTER — Ambulatory Visit (INDEPENDENT_AMBULATORY_CARE_PROVIDER_SITE_OTHER): Payer: BC Managed Care – PPO | Admitting: Internal Medicine

## 2012-01-02 ENCOUNTER — Encounter: Payer: Self-pay | Admitting: Internal Medicine

## 2012-01-02 VITALS — BP 114/70 | HR 77 | Temp 98.3°F | Wt 116.0 lb

## 2012-01-02 DIAGNOSIS — M5430 Sciatica, unspecified side: Secondary | ICD-10-CM | POA: Insufficient documentation

## 2012-01-02 DIAGNOSIS — F4323 Adjustment disorder with mixed anxiety and depressed mood: Secondary | ICD-10-CM | POA: Insufficient documentation

## 2012-01-02 MED ORDER — BUPROPION HCL ER (XL) 150 MG PO TB24: 150.0000 mg | ORAL_TABLET | Freq: Every day | ORAL | Status: DC

## 2012-01-02 MED ORDER — ALPRAZOLAM 0.25 MG PO TABS: 0.2500 mg | ORAL_TABLET | Freq: Three times a day (TID) | ORAL | Status: DC | PRN

## 2012-01-02 NOTE — Progress Notes (Signed)
Chief Complaint  Patient presents with  . Follow-up    Has been seeing a counselor and has been told she is "dangerously depressed."  Needs to discuss medications.     HPI: Comes in  "follow up" however really heer for depression problem . Has been having depressed sx for the past months and in counseling for poss 5 months but still havine anhedonia and hopelessness.  Remote hx of depression other times with mned x 2 when dad died at age 46 and took effexor and had very hard time getting off  Also when had divorce years ago.  Is beginning to have anxiety attacks with the depression  . Doesn't want med but advised as options by counselor.   yaking 1/4 xanax  As needed for now.  No tob min caffeine and etoh some but not a lot .  Trying to do all the right thinks .  Battling non discogenic sciatica takes tramadol once about 3 d per week to get through . Stays as active as possible  ROS: See pertinent positives and negatives per HPI.  Past Medical History  Diagnosis Date  . Anxiety   . Depression   . GERD (gastroesophageal reflux disease)   . Headache   . Recurrent sinusitis     ent and pulm eval in past.  . Environmental allergies     Family History  Problem Relation Age of Onset  . Heart disease      fhx    History   Social History  . Marital Status: Single    Spouse Name: N/A    Number of Children: N/A  . Years of Education: N/A   Social History Main Topics  . Smoking status: Never Smoker   . Smokeless tobacco: None  . Alcohol Use: Yes     Comment: Occasional use  . Drug Use: No  . Sexually Active: Yes    Birth Control/ Protection: Pill   Other Topics Concern  . None   Social History Narrative   estheticianNon smoker    Outpatient Encounter Prescriptions as of 01/02/2012  Medication Sig Dispense Refill  . B Complex Vitamins (VITAMIN-B COMPLEX PO) Take by mouth.      . Calcium Carbonate-Vitamin D (CALCIUM-VITAMIN D) 500-200 MG-UNIT per tablet Take 1 tablet by  mouth 2 (two) times daily with a meal.      . co-enzyme Q-10 30 MG capsule Take 30 mg by mouth 3 (three) times daily.        . fish oil-omega-3 fatty acids 1000 MG capsule Take 1 g by mouth daily.        . fluticasone (FLONASE) 50 MCG/ACT nasal spray PLACE 2 SPRAYS INTO THE NOSE DAILY.  16 g  2  . folic acid (FOLVITE) 1 MG tablet Take 1 mg by mouth daily.      [name] LYSINE PO Take 1,000 mg by mouth daily. for fever blisters.      . norethindrone-ethinyl estradiol (JUNEL FE 1/20) 1-20 MG-MCG per tablet Take 1 tablet by mouth daily.        . traMADol-acetaminophen (ULTRACET) 37.5-325 MG per tablet Take 1 tablet by mouth every 6 (six) hours as needed.      . valACYclovir (VALTREX) 1000 MG tablet Take 1 tablet (1,000 mg total) by mouth 2 (two) times daily.  30 tablet  1  . vitamin C (ASCORBIC ACID) 500 MG tablet Take 500 mg by mouth daily.      [name] ALPRAZolam (XANAX) 0.25 MG tablet  Take 1 tablet (0.25 mg total) by mouth 3 (three) times daily as needed for anxiety.  30 tablet  0  . buPROPion (WELLBUTRIN XL) 150 MG 24 hr tablet Take 1 tablet (150 mg total) by mouth daily. Increase to 2 q am after a week  60 tablet  1  . [DISCONTINUED] levofloxacin (LEVAQUIN) 750 MG tablet Take 1 tablet (750 mg total) by mouth daily.  7 tablet  0    EXAM:  BP 114/70  Pulse 77  Temp 98.3 F (36.8 C) (Oral)  Wt 116 lb (52.617 kg)  SpO2 97%  LMP 12/12/2011  There is no height on file to calculate BMI.  GENERAL: vitals reviewed and listed above, alert, oriented, appears well hydrated and in no acute distress mildly distressed    HEENT: atraumatic, conjunctiva  clear, no obvious abnormalities on inspection of external nose and ears OP : no lesion edema or exudate   NECK: no obvious masses on inspection palpation   LUNGS: clear to auscultation bilaterally, no wheezes, rales or rhonchi, good air movement  CV: HRRR, no clubbing cyanosis or  peripheral edema nl cap refill   MS: moves all extremities without noticeable  focal  abnormality  PSYCH: pleasant and cooperative, good eye contact  PHQ 9 -23 score all in shaded area 8 an 3 and 9 2  Rest are 4 rated and somewhat difficult so functions   ASSESSMENT AND PLAN:  Discussed the following assessment and plan:  1. Adjustment reaction with anxiety and depression    sig phq9 score in counseling fear of having med hard to wean as effexor. wellbutrin not as good for panic but wants to try  add on xanax at this time  may need   2. Sciatica    non discogenic  uncer care prn ultram 1 per day 3 days per week at most.   may need to add low dose ssri or  Other such as buspar.  Fu in about 4 weeks or as needed continue counseling .  -Patient advised to return or notify health care team  immediately if symptoms worsen or persist or new concerns arise.  Patient Instructions  Ok to try wellbutrin    For depression  and  Can use xanax as needed.   ROV  in about 3-4 weeks . Or as needed  [name]. Jaxton Casale M.D.  Total visit > 50% spent counseling and coordinating care

## 2012-01-02 NOTE — Progress Notes (Deleted)
  Subjective:    Patient ID: Terri [name], female    DOB: 1965-10-08, 46 y.o.   MRN: [id number]  HPI    Review of Systems     Objective:   Physical Exam        Assessment & Plan:

## 2012-01-02 NOTE — Patient Instructions (Signed)
Ok to try wellbutrin    For depression  and  Can use xanax as needed.   ROV  in about 3-4 weeks . Or as needed

## 2012-01-16 ENCOUNTER — Telehealth: Payer: Self-pay | Admitting: Internal Medicine

## 2012-01-16 NOTE — Telephone Encounter (Signed)
[facility] Triage Call Report Triage Record Num: [id number] Operator: Merlinda Frederick Patient Name: Terri [name] Call Date & Time: 01/15/2012 7:51:32PM Patient Phone: [phone] PCP: [name]. Panosh Patient Gender: Female PCP Fax : [phone] Patient DOB: 03/11/1965 [facility]  Reason for Call: Caller: Arelie/Patient; PCP: Berniece Andreas (Family Practice); CB#: [phone]; Call regarding possible side effects from Wellbutrin, LMP 01/08/12. Pt has been taking Wellbutrin since 01/02/12, 150mg  tablets, pt is taking 300mg  po daily. Pt has noticed tremors, dry mouth, heart racing, night sweats, and increased anxiety since increasing Wellbutrin from 150mg  to 300mg . Pt has been taking Xanax prn as prescribed by Dr. Fabian Sharp for anxiety. At the time of the call, pt is asymptomatic with the exception of r "mild confusion" at the time of call, having difficulty focusing. Pt is currently driving back from [place]. Triaged per Confusion, Disorientation, Agitation Guideline ED Disposition for "new or worsening confusion, disorientation, or agitation." Advised pt to be seen immediately, pt declined stating "I will not go tonight, I absolutely refuse to go." Advised pt of possible risks associated with delaying treatment and risks associated with driving with these symptoms, pt verbalized understanding, however, still refuses to go to the ED tonight, states "I feel like I'm safe to drive." Advised pt that a note would be sent to the office regarding the Wellbutrin and to follow up in the am 01/16/12 regarding this. Advised pt of call back parameters, verbalized understanding.

## 2012-01-16 NOTE — Telephone Encounter (Signed)
Left message on machine for patient  - per Dr Fabian Sharp - patient should stop the Wellbutrin and follow up with an office visit.

## 2012-01-29 ENCOUNTER — Ambulatory Visit: Payer: BC Managed Care – PPO | Admitting: Internal Medicine

## 2012-01-30 ENCOUNTER — Encounter: Payer: Self-pay | Admitting: Internal Medicine

## 2012-01-30 ENCOUNTER — Ambulatory Visit (INDEPENDENT_AMBULATORY_CARE_PROVIDER_SITE_OTHER): Payer: BC Managed Care – PPO | Admitting: Internal Medicine

## 2012-01-30 VITALS — BP 118/82 | HR 97 | Temp 97.6°F | Wt 116.0 lb

## 2012-01-30 DIAGNOSIS — J3489 Other specified disorders of nose and nasal sinuses: Secondary | ICD-10-CM

## 2012-01-30 DIAGNOSIS — F4323 Adjustment disorder with mixed anxiety and depressed mood: Secondary | ICD-10-CM

## 2012-01-30 DIAGNOSIS — T887XXA Unspecified adverse effect of drug or medicament, initial encounter: Secondary | ICD-10-CM

## 2012-01-30 DIAGNOSIS — R0981 Nasal congestion: Secondary | ICD-10-CM

## 2012-01-30 NOTE — Progress Notes (Signed)
Chief Complaint  Patient presents with  . Follow-up    side effect of med  depression    HPI: Fu of  Medication   See phone note of reaction to increase dose of wellbutrin .   Had some mild sx beginning    150 dose .Heart beating hard   Not fast and attenuated   Better if eats regularly  Felt better with depression and then had  Increase reaction( see note)  Has some fine tremor    Decreased to single dose  150 mg     For almost 2 weeks.  Didn't take the [facility].    Except 1/4 low dose.  Now leveled off. Helps the mood  Not as much anxiety  Still wants to  avoiding effexor cause of se getting off.   Now panic  Attack  Better.   On current regimen  ROS: See pertinent positives and negatives per HPI.  Past Medical History  Diagnosis Date  . Anxiety   . Depression   . GERD (gastroesophageal reflux disease)   . Headache   . Recurrent sinusitis     ent and pulm eval in past.  . Environmental allergies     Family History  Problem Relation Age of Onset  . Heart disease      fhx    History   Social History  . Marital Status: Single    Spouse Name: N/A    Number of Children: N/A  . Years of Education: N/A   Social History Main Topics  . Smoking status: Never Smoker   . Smokeless tobacco: None  . Alcohol Use: Yes     Comment: Occasional use  . Drug Use: No  . Sexually Active: Yes    Birth Control/ Protection: Pill   Other Topics Concern  . None   Social History Narrative   estheticianNon smoker    Outpatient Encounter Prescriptions as of 01/30/2012  Medication Sig Dispense Refill  . ALPRAZolam (XANAX) 0.25 MG tablet Take 1 tablet (0.25 mg total) by mouth 3 (three) times daily as needed for anxiety.  30 tablet  0  . B Complex Vitamins (VITAMIN-B COMPLEX PO) Take by mouth.      [name] buPROPion (WELLBUTRIN XL) 150 MG 24 hr tablet Take 150 mg by mouth daily.      . Calcium Carbonate-Vitamin D (CALCIUM-VITAMIN D) 500-200 MG-UNIT per tablet Take 1 tablet by mouth 2 (two) times  daily with a meal.      . co-enzyme Q-10 30 MG capsule Take 30 mg by mouth 3 (three) times daily.        . fish oil-omega-3 fatty acids 1000 MG capsule Take 1 g by mouth daily.        . fluticasone (FLONASE) 50 MCG/ACT nasal spray PLACE 2 SPRAYS INTO THE NOSE DAILY.  16 g  2  . folic acid (FOLVITE) 1 MG tablet Take 1 mg by mouth daily.      [name] LYSINE PO Take 1,000 mg by mouth daily. for fever blisters.      . norethindrone-ethinyl estradiol (JUNEL FE 1/20) 1-20 MG-MCG per tablet Take 1 tablet by mouth daily.        . vitamin C (ASCORBIC ACID) 500 MG tablet Take 500 mg by mouth daily.      . [DISCONTINUED] buPROPion (WELLBUTRIN XL) 150 MG 24 hr tablet Take 1 tablet (150 mg total) by mouth daily. Increase to 2 q am after a week  60 tablet  1  .  traMADol-acetaminophen (ULTRACET) 37.5-325 MG per tablet Take 1 tablet by mouth every 6 (six) hours as needed.      . valACYclovir (VALTREX) 1000 MG tablet Take 1 tablet (1,000 mg total) by mouth 2 (two) times daily.  30 tablet  1    EXAM:  BP 118/82  Pulse 97  Temp 97.6 F (36.4 C) (Oral)  Wt 116 lb (52.617 kg)  SpO2 98%  LMP 01/09/2012  There is no height on file to calculate BMI.  GENERAL: vitals reviewed and listed above, alert, oriented, appears well hydrated and in no acute distress  HEENT: atraumatic, conjunctiva  clear, no obvious abnormalities on inspection of external nose and ears OP : no lesion edema or exudate  Mild congestion today   NECK: no obvious masses on inspection palpation   LUNGS: clear to auscultation bilaterally, no wheezes, rales or rhonchi, good air movement  CV: HRRR, no clubbing cyanosis or  peripheral edema nl cap refill    PSYCH: pleasant and cooperative, mildy anxious good eye contact   ASSESSMENT AND PLAN:  Discussed the following assessment and plan:  1. Adjustment reaction with anxiety and depression   2. Medication side effect   3. Head congestion   mild  [place] Peers today  Wants to stay on this med and  try again to inc dose and see   She hopes se may attenuate. Reviewed other options s  Ssri and snris  Has had a positive effect but no optimum  -Patient advised to return or notify health care team  immediately if symptoms worsen or persist or new concerns arise.  Patient Instructions  Ok to try again to increase dose   wellbutrin And contact   us about other options  Can try every other day  To see if can increase dose.   Pristiq  Is  In the same group as  effexor  snri.  Could  Come back and decide on other  meds     Total visit > 50% spent counseling and coordinating care     [place] K. Esmee Fallaw M.D.

## 2012-01-30 NOTE — Patient Instructions (Addendum)
Ok to try again to increase dose   wellbutrin And contact   us about other options  Can try every other day  To see if can increase dose.   Pristiq  Is  In the same group as  effexor  snri.  Could  Come back and decide on other  meds

## 2012-02-19 ENCOUNTER — Ambulatory Visit: Payer: BC Managed Care – PPO | Admitting: Internal Medicine

## 2012-03-19 ENCOUNTER — Other Ambulatory Visit: Payer: Self-pay | Admitting: Internal Medicine

## 2012-04-09 ENCOUNTER — Other Ambulatory Visit: Payer: Self-pay | Admitting: Internal Medicine

## 2012-04-26 ENCOUNTER — Other Ambulatory Visit: Payer: Self-pay | Admitting: Internal Medicine

## 2012-05-20 ENCOUNTER — Ambulatory Visit: Payer: BC Managed Care – PPO | Attending: Orthopaedic Surgery | Admitting: Physical Therapy

## 2012-05-20 DIAGNOSIS — IMO0001 Reserved for inherently not codable concepts without codable children: Secondary | ICD-10-CM | POA: Insufficient documentation

## 2012-05-20 DIAGNOSIS — M545 Low back pain, unspecified: Secondary | ICD-10-CM | POA: Insufficient documentation

## 2012-05-20 DIAGNOSIS — M25559 Pain in unspecified hip: Secondary | ICD-10-CM | POA: Insufficient documentation

## 2012-06-03 ENCOUNTER — Other Ambulatory Visit: Payer: Self-pay | Admitting: Internal Medicine

## 2012-06-03 ENCOUNTER — Ambulatory Visit: Payer: BC Managed Care – PPO | Attending: Orthopaedic Surgery | Admitting: Physical Therapy

## 2012-06-03 DIAGNOSIS — M25559 Pain in unspecified hip: Secondary | ICD-10-CM | POA: Insufficient documentation

## 2012-06-03 DIAGNOSIS — M545 Low back pain, unspecified: Secondary | ICD-10-CM | POA: Insufficient documentation

## 2012-06-03 DIAGNOSIS — IMO0001 Reserved for inherently not codable concepts without codable children: Secondary | ICD-10-CM | POA: Insufficient documentation

## 2012-06-03 NOTE — Telephone Encounter (Signed)
Pt also needs refill of buPROPion (WELLBUTRIN XL) 150 MG 24 hr tablet. Pt has made an appt for July 14, which is six months from her last med check in Jan. 2014. (per pt)  (This request has been added to the flonase request from [facility] [address])

## 2012-06-04 NOTE — Telephone Encounter (Signed)
PT called to inquire about refill. Please assist.

## 2012-06-05 ENCOUNTER — Telehealth: Payer: Self-pay | Admitting: Internal Medicine

## 2012-06-05 MED ORDER — BUPROPION HCL ER (XL) 150 MG PO TB24: ORAL_TABLET | ORAL | Status: DC

## 2012-06-05 NOTE — Telephone Encounter (Signed)
Patient Information:  Caller Name: Tashiya  Phone: [phone]  Patient: Terri [name], Terri [name]  Gender: Female  DOB: December 07, 1965  Age: 47 Years  PCP: Berniece Andreas [facility])  Pregnant: No  Office Follow Up:  Does the office need to follow up with this patient?: Yes  Instructions For The Office: Terri [name] states she has called office on 06/03/12, 06/04/12 and 06/05/12  and has requested refill on Wellbutrin [facility] [address]. Please call Terri [name] to verify prescription has been called in- may leave message.   Symptoms  Reason For Call & Symptoms: PLEASE CALL Terri [name] AT [phone] WHEN PRESCRIPTION HAS BEEN CALLED IN TO PHARMACY-PLEASE LEAVE MESSAGE IF Terri [name] DOES NOT ANSWER Terri [name] states she called office on 06/03/12, 06/04/12 to request refill for Wellbutrin. States pharmacy told her on 06/03/12 that she needed an appointment.States she spoke with office on 06/03/12 and was told she did not need to be seen in office until July 2014. Was seen by Dr. Fabian Sharp in January 2014.  Telephone encounter not seen in EPIC. Demographics reviewed to verify patient. Only has 2 doses left and is "becoming anxious" about prescription.  [facility] [address]  Reviewed Health History In EMR: Yes  Reviewed Medications In EMR: Yes  Reviewed Allergies In EMR: Yes  Reviewed Surgeries / Procedures: Yes  Date of Onset of Symptoms: Unknown OB / GYN:  LMP: 05/26/2012  Guideline(s) Used:  No Protocol Available - Information Only  Disposition Per Guideline:   Discuss with PCP and Callback by Nurse Today  Reason For Disposition Reached:   Nursing judgment  Advice Given:  Call Back If:  New symptoms develop  You become worse.  Patient Will Follow Care Advice:  YES

## 2012-06-05 NOTE — Telephone Encounter (Signed)
Per the pt, left message on home phone informing her medication has been sent to the pharmacy.

## 2012-06-18 ENCOUNTER — Ambulatory Visit: Payer: BC Managed Care – PPO | Admitting: Physical Therapy

## 2012-07-02 ENCOUNTER — Ambulatory Visit: Payer: BC Managed Care – PPO | Attending: Orthopaedic Surgery | Admitting: Physical Therapy

## 2012-07-02 DIAGNOSIS — IMO0001 Reserved for inherently not codable concepts without codable children: Secondary | ICD-10-CM | POA: Insufficient documentation

## 2012-07-02 DIAGNOSIS — M25559 Pain in unspecified hip: Secondary | ICD-10-CM | POA: Insufficient documentation

## 2012-07-02 DIAGNOSIS — M545 Low back pain, unspecified: Secondary | ICD-10-CM | POA: Insufficient documentation

## 2012-07-03 ENCOUNTER — Encounter: Payer: BC Managed Care – PPO | Admitting: Physical Therapy

## 2012-07-08 ENCOUNTER — Ambulatory Visit: Payer: BC Managed Care – PPO | Admitting: Physical Therapy

## 2012-07-29 ENCOUNTER — Other Ambulatory Visit: Payer: Self-pay | Admitting: Internal Medicine

## 2012-08-05 ENCOUNTER — Ambulatory Visit (INDEPENDENT_AMBULATORY_CARE_PROVIDER_SITE_OTHER): Payer: BC Managed Care – PPO | Admitting: Internal Medicine

## 2012-08-05 ENCOUNTER — Encounter: Payer: Self-pay | Admitting: Internal Medicine

## 2012-08-05 VITALS — BP 116/70 | HR 93 | Temp 98.2°F | Wt 114.0 lb

## 2012-08-05 DIAGNOSIS — Z79899 Other long term (current) drug therapy: Secondary | ICD-10-CM

## 2012-08-05 DIAGNOSIS — F4323 Adjustment disorder with mixed anxiety and depressed mood: Secondary | ICD-10-CM

## 2012-08-05 MED ORDER — BUPROPION HCL ER (XL) 150 MG PO TB24: ORAL_TABLET | ORAL | Status: DC

## 2012-08-05 NOTE — Progress Notes (Signed)
Chief Complaint  Patient presents with  . Follow-up    HPI: Fu medication and management  doing well now   2 Wellbutrin  150.   X l   Not as depressed  Not that much with anxiety   And therefore fells better  Xanax  If needed. 1/4   Not very much about every 3 weeks.  Had a rough year but doing well now.   No sig se  ROS: See pertinent positives and negatives per HPI.  Past Medical History  Diagnosis Date  . Anxiety   . Depression   . GERD (gastroesophageal reflux disease)   . Headache(784.0)   . Recurrent sinusitis     ent and pulm eval in past.  . Environmental allergies     Family History  Problem Relation Age of Onset  . Heart disease      fhx    History   Social History  . Marital Status: Single    Spouse Name: N/A    Number of Children: N/A  . Years of Education: N/A   Social History Main Topics  . Smoking status: Never Smoker   . Smokeless tobacco: None  . Alcohol Use: Yes     Comment: Occasional use  . Drug Use: No  . Sexually Active: Yes    Birth Control/ Protection: Pill   Other Topics Concern  . None   Social History Narrative   esthetician   Non smoker    Outpatient Encounter Prescriptions as of 08/05/2012  Medication Sig Dispense Refill  . ALPRAZolam (XANAX) 0.25 MG tablet Take 1 tablet (0.25 mg total) by mouth 3 (three) times daily as needed for anxiety.  30 tablet  0  . B Complex Vitamins (VITAMIN-B COMPLEX PO) Take by mouth.      [name] buPROPion (WELLBUTRIN XL) 150 MG 24 hr tablet TAKE 2 TABLETS DAILY  60 tablet  6  . CALCIUM PO Take 1,000 mg by mouth.      . co-enzyme Q-10 30 MG capsule Take 30 mg by mouth 3 (three) times daily.        . fish oil-omega-3 fatty acids 1000 MG capsule Take 1 g by mouth daily.        . fluticasone (FLONASE) 50 MCG/ACT nasal spray USE 2 SPRAYS IN EACH NOSTRIL EVERY DAY  16 g  2  . folic acid (FOLVITE) 1 MG tablet Take 1 mg by mouth daily.      [name] LYSINE PO Take 1,000 mg by mouth daily. for fever blisters.      .  norethindrone-ethinyl estradiol (JUNEL FE 1/20) 1-20 MG-MCG per tablet Take 1 tablet by mouth daily.        . traMADol-acetaminophen (ULTRACET) 37.5-325 MG per tablet Take 1 tablet by mouth every 6 (six) hours as needed.      . vitamin C (ASCORBIC ACID) 500 MG tablet Take 500 mg by mouth daily.      . [DISCONTINUED] buPROPion (WELLBUTRIN XL) 150 MG 24 hr tablet TAKE 2 TABLETS DAILY  60 tablet  0  . valACYclovir (VALTREX) 1000 MG tablet TAKE 1 TABLET (1,000 MG TOTAL) BY MOUTH 2 (TWO) TIMES DAILY.  30 tablet  1  . [DISCONTINUED] buPROPion (WELLBUTRIN XL) 150 MG 24 hr tablet Take 150 mg by mouth daily.      . [DISCONTINUED] buPROPion (WELLBUTRIN XL) 150 MG 24 hr tablet TAKE 1 TABLET BY MOUTH EVERY DAY *MAY INCREASE TO 2 TABLETS EVERY MORNING AFTER ONE WEEK*  60  tablet  0  . [DISCONTINUED] Calcium Carbonate-Vitamin D (CALCIUM-VITAMIN D) 500-200 MG-UNIT per tablet Take 1 tablet by mouth 2 (two) times daily with a meal.       No facility-administered encounter medications on file as of 08/05/2012.    EXAM:  BP 116/70  Pulse 93  Temp(Src) 98.2 F (36.8 C) (Oral)  Wt 114 lb (51.71 kg)  BMI 19.56 kg/m2  SpO2 98%  LMP 07/23/2012  Body mass index is 19.56 kg/(m^2).  GENERAL: vitals reviewed and listed above, alert, oriented, appears well hydrated and in no acute distress  HEENT: atraumatic, conjunctiva  clear, no obvious abnormalities on inspection of external nose and ears mild congestion PSYCH: pleasant and cooperative, no obvious depression or anxiety  ASSESSMENT AND PLAN:  Discussed the following assessment and plan:  Adjustment reaction with anxiety and depression - doing well on current regimen  rare  use of xanax xocontinue  another 6 months consider dcthen if appropriate  Medication management Refill x 6 months  rov 6 months  Or as needed. ( Sees gyne yearly ) -Patient advised to return or notify health care team  if symptoms worsen or persist or new concerns arise.  Patient  Instructions  Continue same medication as we said .   ROV   in 6 months med evaluation.      [name]. Coy Rochford M.D.

## 2012-08-05 NOTE — Patient Instructions (Signed)
Continue same medication as we said .   ROV   in 6 months med evaluation.

## 2012-08-25 ENCOUNTER — Other Ambulatory Visit: Payer: Self-pay | Admitting: Internal Medicine

## 2012-11-05 ENCOUNTER — Other Ambulatory Visit: Payer: Self-pay | Admitting: Internal Medicine

## 2012-11-07 ENCOUNTER — Other Ambulatory Visit: Payer: Self-pay | Admitting: Internal Medicine

## 2012-12-16 ENCOUNTER — Ambulatory Visit (INDEPENDENT_AMBULATORY_CARE_PROVIDER_SITE_OTHER): Payer: BC Managed Care – PPO | Admitting: Internal Medicine

## 2012-12-16 ENCOUNTER — Encounter: Payer: Self-pay | Admitting: Internal Medicine

## 2012-12-16 VITALS — BP 114/74 | HR 87 | Temp 98.4°F | Wt 110.0 lb

## 2012-12-16 DIAGNOSIS — J019 Acute sinusitis, unspecified: Secondary | ICD-10-CM | POA: Insufficient documentation

## 2012-12-16 DIAGNOSIS — J329 Chronic sinusitis, unspecified: Secondary | ICD-10-CM

## 2012-12-16 MED ORDER — CEFUROXIME AXETIL 500 MG PO TABS: 500.0000 mg | ORAL_TABLET | Freq: Two times a day (BID) | ORAL | Status: DC

## 2012-12-16 MED ORDER — FLUCONAZOLE 150 MG PO TABS: 150.0000 mg | ORAL_TABLET | Freq: Once | ORAL | Status: DC

## 2012-12-16 NOTE — Progress Notes (Signed)
Chief Complaint  Patient presents with  . Sinus pressure/Swelling    Green nasal discharge.  Has had some fever.  Has been taking Claritin and Claritin D and Sudafed.  [name] Hoarse  . Cough  . Nasal Discharge    HPI: Patient comes in today for SDA for  new problem evaluation. 2 weeks of sx a bit better today but low grade fever   upper respiratory congestion left facial achiness and tenderness in left ear pain Left side sx with swollen. Has a deviated septum Weather an issue tends to get a sinus infection once a year o evaluated in past usually goes on 3-4 weeks before she comes in. This is preholiday medication helps some ROS: See pertinent positives and negatives per HPI. No cough vomiting fever and chills.  Past Medical History  Diagnosis Date  . Anxiety   . Depression   . GERD (gastroesophageal reflux disease)   . Headache(784.0)   . Recurrent sinusitis     ent and pulm eval in past.  . Environmental allergies     Family History  Problem Relation Age of Onset  . Heart disease      fhx    History   Social History  . Marital Status: Single    Spouse Name: N/A    Number of Children: N/A  . Years of Education: N/A   Social History Main Topics  . Smoking status: Never Smoker   . Smokeless tobacco: None  . Alcohol Use: Yes     Comment: Occasional use  . Drug Use: No  . Sexual Activity: Yes    Birth Control/ Protection: Pill   Other Topics Concern  . None   Social History Narrative   esthetician   Non smoker    Outpatient Encounter Prescriptions as of 12/16/2012  Medication Sig  . ALPRAZolam (XANAX) 0.25 MG tablet Take 1 tablet (0.25 mg total) by mouth 3 (three) times daily as needed for anxiety.  . B Complex Vitamins (VITAMIN-B COMPLEX PO) Take by mouth.  [name] buPROPion (WELLBUTRIN XL) 150 MG 24 hr tablet TAKE 2 TABLETS DAILY  . CALCIUM PO Take 1,000 mg by mouth.  . co-enzyme Q-10 30 MG capsule Take 30 mg by mouth 3 (three) times daily.    . fish oil-omega-3  fatty acids 1000 MG capsule Take 1 g by mouth daily.    . fluticasone (FLONASE) 50 MCG/ACT nasal spray USE 2 SPRAYS IN EACH NOSTRIL EVERY DAY  . folic acid (FOLVITE) 1 MG tablet Take 1 mg by mouth daily.  [name] LYSINE PO Take 1,000 mg by mouth daily. for fever blisters.  . norethindrone-ethinyl estradiol (JUNEL FE 1/20) 1-20 MG-MCG per tablet Take 1 tablet by mouth daily.    . traMADol-acetaminophen (ULTRACET) 37.5-325 MG per tablet Take 1 tablet by mouth every 6 (six) hours as needed.  . valACYclovir (VALTREX) 1000 MG tablet TAKE 1 TABLET (1,000 MG TOTAL) BY MOUTH 2 (TWO) TIMES DAILY.  . vitamin C (ASCORBIC ACID) 500 MG tablet Take 500 mg by mouth daily.  . cefUROXime (CEFTIN) 500 MG tablet Take 1 tablet (500 mg total) by mouth 2 (two) times daily with a meal.  . fluconazole (DIFLUCAN) 150 MG tablet Take 1 tablet (150 mg total) by mouth once.    EXAM:  BP 114/74  Pulse 87  Temp(Src) 98.4 F (36.9 C) (Oral)  Wt 110 lb (49.896 kg)  SpO2 97%  Body mass index is 18.87 kg/(m^2).  GENERAL: vitals reviewed and listed above, alert,  oriented, appears well hydrated and in no acute distress appears congested nontoxic HEENT normocephalic TMs clear small amount of wax in left canal but no redness nares +2 turbinates left maxillary slightly tender OP mild erythema neck tender a.c. but not enlarged LUNGS: clear to auscultation bilaterally, no wheezes, rales or rhonchi, good air movement CV: HRRR, no clubbing cyanosis or  peripheral edema nl cap refill  MS: moves all extremities without noticeable focal  abnormality PSYCH: pleasant and cooperative, no obvious depression or anxiety  ASSESSMENT AND PLAN:  Discussed the following assessment and plan:  Recurrent sinusitis  SINUSITIS, ACUTE Acute on chronic recurrent last treatment a while back with Levaquin gets GI side effects with Augmentin. Will give Ceftin today if she needs to use it one twice a day for 10 days continue local measures. Would  reserve Levaquin broad-spectrum for treatment failure. Reasons discussed. Patient states that she is sensitive to the topical 80s also if she gets a yeast infection needs to use Diflucan. Prescription printed out today in case this is necessary. She can call for further meds if needed. -Patient advised to return or notify health care team  if symptoms worsen or persist or new concerns arise.  Patient Instructions  Can treat for sinusitis if  persistent or progressive  Contact us if  needed for yeast infection  .   [name]. [place] Deleo M.D.

## 2012-12-16 NOTE — Patient Instructions (Signed)
Can treat for sinusitis if  persistent or progressive  Contact us if  needed for yeast infection  .

## 2013-05-17 ENCOUNTER — Other Ambulatory Visit: Payer: Self-pay | Admitting: Internal Medicine

## 2013-05-19 ENCOUNTER — Telehealth: Payer: Self-pay | Admitting: Family Medicine

## 2013-05-19 NOTE — Telephone Encounter (Signed)
This patient is past due for her med follow up.  Please contact the patient and make an appt with WP. Thanks!

## 2013-05-22 NOTE — Telephone Encounter (Signed)
Left message for pt to call and schedule appt 

## 2013-06-24 ENCOUNTER — Ambulatory Visit (INDEPENDENT_AMBULATORY_CARE_PROVIDER_SITE_OTHER): Payer: BC Managed Care – PPO | Admitting: Internal Medicine

## 2013-06-24 ENCOUNTER — Encounter: Payer: Self-pay | Admitting: Internal Medicine

## 2013-06-24 VITALS — BP 112/70 | Temp 98.8°F | Ht 63.0 in | Wt 115.0 lb

## 2013-06-24 DIAGNOSIS — F329 Major depressive disorder, single episode, unspecified: Secondary | ICD-10-CM

## 2013-06-24 DIAGNOSIS — F411 Generalized anxiety disorder: Secondary | ICD-10-CM

## 2013-06-24 DIAGNOSIS — F3289 Other specified depressive episodes: Secondary | ICD-10-CM

## 2013-06-24 DIAGNOSIS — Z79899 Other long term (current) drug therapy: Secondary | ICD-10-CM

## 2013-06-24 MED ORDER — BUPROPION HCL ER (XL) 150 MG PO TB24: ORAL_TABLET | ORAL | Status: DC

## 2013-06-24 MED ORDER — ALPRAZOLAM 0.25 MG PO TABS: 0.2500 mg | ORAL_TABLET | Freq: Three times a day (TID) | ORAL | Status: DC | PRN

## 2013-06-24 NOTE — Patient Instructions (Addendum)
Prefer lorazepam if we needed  To use more alprazolam.  But ok to refill x 1 . With the wellbutrin can try as per you r therapist to decrease.  2 alt 1 and then  1 per day   For weeks and then decide on  Further wean.   Otherwise well visit when 6-12 months when 48 years.

## 2013-06-24 NOTE — Progress Notes (Signed)
Chief Complaint  Patient presents with  . Follow-up    medication    HPI: Fu medication  Taking wellbutrin xr 300   Seeing therapist  deression better  Considering decreasing wellbutrin.  Sleep.  Ok   Remote hs of effexor .  Had  Breath through withdrawals.    Anxiety    ? Worse with this but so much on depression.  alpraz as needed.  Not filled  Much. Has old pills left  from 2013 rx . 1/4 if needed for panic attack. Has bottle  Takes  ocass tramadol every 1-2  Weeks  Neg tob , limited caffiene, social drinker .  ROS: See pertinent positives and negatives per HPI. No sinus infection today  Last rx helped well   Past Medical History  Diagnosis Date  . Anxiety   . Depression   . GERD (gastroesophageal reflux disease)   . Headache(784.0)   . Recurrent sinusitis     ent and pulm eval in past.  . Environmental allergies    Past Surgical History  Procedure Laterality Date  . Left hip surgery      [facility]  . Hip arthroscopy w/ labral debridement       Family History  Problem Relation Age of Onset  . Heart disease      fhx    History   Social History  . Marital Status: Single    Spouse Name: N/A    Number of Children: N/A  . Years of Education: N/A   Social History Main Topics  . Smoking status: Never Smoker   . Smokeless tobacco: None  . Alcohol Use: Yes     Comment: Occasional use  . Drug Use: No  . Sexual Activity: Yes    Birth Control/ Protection: Pill   Other Topics Concern  . None   Social History Narrative   esthetician   Non smoker    Outpatient Encounter Prescriptions as of 06/24/2013  Medication Sig  . ALPRAZolam (XANAX) 0.25 MG tablet Take 1 tablet (0.25 mg total) by mouth 3 (three) times daily as needed for anxiety.  . B Complex Vitamins (VITAMIN-B COMPLEX PO) Take by mouth.  [name]. buPROPion (WELLBUTRIN XL) 150 MG 24 hr tablet TAKE 2 TABLETS DAILY oras directed  . CALCIUM PO Take 1,000 mg by mouth.  . co-enzyme Q-10 30 MG capsule Take 30 mg  by mouth 3 (three) times daily.    . fish oil-omega-3 fatty acids 1000 MG capsule Take 1 g by mouth daily.    . fluticasone (FLONASE) 50 MCG/ACT nasal spray USE 2 SPRAYS IN EACH NOSTRIL EVERY DAY  . folic acid (FOLVITE) 1 MG tablet Take 1 mg by mouth daily.  [name]. LYSINE PO Take 1,000 mg by mouth daily. for fever blisters.  . norethindrone-ethinyl estradiol (JUNEL FE 1/20) 1-20 MG-MCG per tablet Take 1 tablet by mouth daily.    . valACYclovir (VALTREX) 1000 MG tablet TAKE 1 TABLET (1,000 MG TOTAL) BY MOUTH 2 (TWO) TIMES DAILY.  . vitamin C (ASCORBIC ACID) 500 MG tablet Take 500 mg by mouth daily.  . [DISCONTINUED] ALPRAZolam (XANAX) 0.25 MG tablet Take 1 tablet (0.25 mg total) by mouth 3 (three) times daily as needed for anxiety.  . [DISCONTINUED] buPROPion (WELLBUTRIN XL) 150 MG 24 hr tablet TAKE 2 TABLETS DAILY  . traMADol-acetaminophen (ULTRACET) 37.5-325 MG per tablet Take 1 tablet by mouth every 6 (six) hours as needed.  . [DISCONTINUED] cefUROXime (CEFTIN) 500 MG tablet Take 1 tablet (500  mg total) by mouth 2 (two) times daily with a meal.  . [DISCONTINUED] fluconazole (DIFLUCAN) 150 MG tablet Take 1 tablet (150 mg total) by mouth once.    EXAM:  BP 112/70  Temp(Src) 98.8 F (37.1 C) (Oral)  Ht 5\' 3"  (1.6 m)  Wt 115 lb (52.164 kg)  BMI 20.38 kg/m2  Body mass index is 20.38 kg/(m^2). GENERAL: vitals reviewed and listed above, alert, oriented, appears well hydrated and in no acute distress HEENT: atraumatic, conjunctiva  clear, no obvious abnormalities on inspection of external nose and ears  NECK: no obvious masses on inspection palpation  MS: moves all extremities without noticeable focal  Abnormality mild antalgic gait  PSYCH: pleasant and cooperative, no obvious depression or anxiety ASSESSMENT AND PLAN:  Discussed the following assessment and plan:  DEPRESSION - doing well disc poss wean and plan   ANXIETY - ocass panic rare [place] of benzos  limited use  at this  time  Medication management  -Patient advised to return or notify health care team  if symptoms worsen ,persist or new concerns arise.  Patient Instructions  Prefer lorazepam if we needed  To use more alprazolam.  But ok to refill x 1 . With the wellbutrin can try as per you r therapist to decrease.  2 alt 1 and then  1 per day   For weeks and then decide on  Further wean.   Otherwise well visit when 6-12 months when 48 years.   [name]. Mardie Kellen M.D.  Pre visit review using our clinic review tool, if applicable. No additional management support is needed unless otherwise documented below in the visit note.

## 2013-10-16 ENCOUNTER — Other Ambulatory Visit: Payer: Self-pay | Admitting: Internal Medicine

## 2013-10-21 ENCOUNTER — Telehealth: Payer: Self-pay | Admitting: Internal Medicine

## 2013-10-21 NOTE — Telephone Encounter (Signed)
Denied.  Please send back to the pharmacy and have the pt call the office for a refill of this medication.  Thanks!

## 2013-10-21 NOTE — Telephone Encounter (Signed)
done

## 2013-10-21 NOTE — Telephone Encounter (Signed)
[id number] - , [place] - [address] is requesting re-fill on doxycycline (VIBRAMYCIN) 100 MG capsule

## 2013-11-24 ENCOUNTER — Other Ambulatory Visit: Payer: Self-pay | Admitting: Internal Medicine

## 2013-11-25 NOTE — Telephone Encounter (Signed)
Sent to the pharmacy by e-scribe. 

## 2014-01-10 ENCOUNTER — Other Ambulatory Visit: Payer: Self-pay | Admitting: Internal Medicine

## 2014-01-12 NOTE — Telephone Encounter (Signed)
Sent to the pharmacy by e-scribe. 

## 2014-02-16 ENCOUNTER — Ambulatory Visit (INDEPENDENT_AMBULATORY_CARE_PROVIDER_SITE_OTHER): Payer: BLUE CROSS/BLUE SHIELD | Admitting: Internal Medicine

## 2014-02-16 ENCOUNTER — Encounter: Payer: Self-pay | Admitting: Internal Medicine

## 2014-02-16 VITALS — BP 112/70 | Temp 97.8°F | Ht 63.0 in | Wt 116.7 lb

## 2014-02-16 DIAGNOSIS — J329 Chronic sinusitis, unspecified: Secondary | ICD-10-CM

## 2014-02-16 DIAGNOSIS — Z79899 Other long term (current) drug therapy: Secondary | ICD-10-CM

## 2014-02-16 DIAGNOSIS — Z8669 Personal history of other diseases of the nervous system and sense organs: Secondary | ICD-10-CM

## 2014-02-16 DIAGNOSIS — F4323 Adjustment disorder with mixed anxiety and depressed mood: Secondary | ICD-10-CM

## 2014-02-16 MED ORDER — SUMATRIPTAN SUCCINATE 100 MG PO TABS: ORAL_TABLET | ORAL | Status: AC

## 2014-02-16 MED ORDER — ALPRAZOLAM 0.25 MG PO TABS: 0.2500 mg | ORAL_TABLET | Freq: Three times a day (TID) | ORAL | Status: DC | PRN

## 2014-02-16 MED ORDER — CEFUROXIME AXETIL 500 MG PO TABS: 500.0000 mg | ORAL_TABLET | Freq: Two times a day (BID) | ORAL | Status: DC

## 2014-02-16 NOTE — Patient Instructions (Addendum)
Antibiotic trial as before  Continue sinus hygeine.  For sick migraine headaches  imitrex .    With  2  Aleve . alprazolam is a rescue medicine  For anxiety and if need to take more often use another  Plan. Do not use with alcohol.

## 2014-02-16 NOTE — Progress Notes (Signed)
Pre visit review using our clinic review tool, if applicable. No additional management support is needed unless otherwise documented below in the visit note.  Chief Complaint  Patient presents with  . Nasal Congestion    [id number].    . Sinus Pressure/Pain  . Migraine    HPI: Patient Terri [name]  comes in today for SDA for   problem evaluation. "I think i have a sinus infection"  About a month flonase and netti pot and then sinus migraine.   And had 3-4 in a week  Onset with nose blood .    Gets sick headaches  Triggered but congestion.  No longer has  imitrex  Used otc tylenol sinus pressure .   ROS: See pertinent positives and negatives per HPI. Hip arthritis   No t using pain meds now.  ocass use of ativan 1/4 pill does better when has some for rescue.   Past Medical History  Diagnosis Date  . Anxiety   . Depression   . GERD (gastroesophageal reflux disease)   . Headache(784.0)   . Recurrent sinusitis     ent and pulm eval in past.  . Environmental allergies     Family History  Problem Relation Age of Onset  . Heart disease      fhx    History   Social History  . Marital Status: Single    Spouse Name: N/A    Number of Children: N/A  . Years of Education: N/A   Social History Main Topics  . Smoking status: Never Smoker   . Smokeless tobacco: Not on file  . Alcohol Use: Yes     Comment: Occasional use  . Drug Use: No  . Sexual Activity: Yes    Birth Control/ Protection: Pill   Other Topics Concern  . Not on file   Social History Narrative   esthetician   Non smoker    Outpatient Encounter Prescriptions as of 02/16/2014  Medication Sig  . ALPRAZolam (XANAX) 0.25 MG tablet Take 1 tablet (0.25 mg total) by mouth 3 (three) times daily as needed for anxiety (panic).  . B Complex Vitamins (VITAMIN-B COMPLEX PO) Take by mouth.  [name]. buPROPion (WELLBUTRIN XL) 150 MG 24 hr tablet TAKE 2 TABLETS BY MOUTH EVERY DAY AS DIRECTED  . CALCIUM PO Take 1,000 mg by  mouth.  . co-enzyme Q-10 30 MG capsule Take 30 mg by mouth 3 (three) times daily.    . fish oil-omega-3 fatty acids 1000 MG capsule Take 1 g by mouth daily.    . fluticasone (FLONASE) 50 MCG/ACT nasal spray USE 2 SPRAYS IN EACH NOSTRIL ONCE DAILY  . folic acid (FOLVITE) 1 MG tablet Take 1 mg by mouth daily.  [name]. LYSINE PO Take 1,000 mg by mouth daily. for fever blisters.  . norethindrone-ethinyl estradiol (JUNEL FE 1/20) 1-20 MG-MCG per tablet Take 1 tablet by mouth daily.    . valACYclovir (VALTREX) 1000 MG tablet TAKE 1 TABLET (1,000 MG TOTAL) BY MOUTH 2 (TWO) TIMES DAILY.  . vitamin C (ASCORBIC ACID) 500 MG tablet Take 500 mg by mouth daily.  . [DISCONTINUED] ALPRAZolam (XANAX) 0.25 MG tablet Take 1 tablet (0.25 mg total) by mouth 3 (three) times daily as needed for anxiety.  . cefUROXime (CEFTIN) 500 MG tablet Take 1 tablet (500 mg total) by mouth 2 (two) times daily with a meal.  . SUMAtriptan (IMITREX) 100 MG tablet Take 1 at onset of  Migraine headache   and can repeat in  2 hours .  . traMADol-acetaminophen (ULTRACET) 37.5-325 MG per tablet Take 1 tablet by mouth every 6 (six) hours as needed.    EXAM:  BP 112/70 mmHg  Temp(Src) 97.8 F (36.6 C) (Oral)  Ht  (1.6 m)  Wt 116 lb 11.2 oz (52.935 kg)  BMI 20.68 kg/m2  Body mass index is 20.68 kg/(m^2). WDWN in NAD  quiet respirations; mildly congested  somewhat hoarse. Non toxic . HEENT: Normocephalic ;atraumatic , Eyes;  PERRL, EOMs  Full, lids and conjunctiva clear,,Ears: no deformities, canals nl, TM landmarks normal, Nose: no deformity or discharge but congested;face max and frontal  tender Mouth : OP clear without lesion or edema . Neck: Supple without adenopathy or masses or bruits Chest:  Clear to A&P without wheezes rales or rhonchi CV:  S1-S2 no gallops or murmurs peripheral perfusion is normal Skin :nl perfusion and no acute rashes  PSYCH: pleasant and cooperative, no obvious depression or anxiety  ASSESSMENT AND  PLAN:  Discussed the following assessment and plan:  Recurrent sinusitis - hx of same  month of sx empiric rx as in past  Adjustment reaction with anxiety and depression - stabel onlyocass use of benzo counseled   History of migraine headaches - avoid decongsetant for HA  to avoid trigger  imitrex and aleve as rescue  Medication management  -Patient advised to return or notify health care team  if symptoms worsen ,persist or new concerns arise.  Patient Instructions  Antibiotic trial as before  Continue sinus hygeine.  For sick migraine headaches  imitrex .    With  2  Aleve . alprazolam is a rescue medicine  For anxiety and if need to take more often use another  Plan. Do not use with alcohol.    [name]. Ulas Zuercher M.D.

## 2014-05-22 ENCOUNTER — Telehealth: Payer: Self-pay | Admitting: Internal Medicine

## 2014-05-22 NOTE — Telephone Encounter (Signed)
Pt has scheduled an appointment for Monday. Advised pt if her sxs become worse or she develops new sxs go to UC or ER

## 2014-05-22 NOTE — Telephone Encounter (Signed)
Pt opt for an appt on Monday. Pt sch for 05-25-14 11 am

## 2014-05-22 NOTE — Telephone Encounter (Signed)
Pt was seen in jan 2016 for sinus inf. Pt states she has another sinus inf unable to make an appt until after 4 pm today and she is not avail for sat clinic due to work. Pt would [facility] abx [facility]. [place].

## 2014-05-22 NOTE — Telephone Encounter (Signed)
Pt called and transfer to Triage

## 2014-05-22 NOTE — Telephone Encounter (Signed)
[facility] - Client TELEPHONE ADVICE RECORD [facility]  Patient Name: Terri [name]  DOB: 01/04/1966    Initial Comment Caller states she thinksCharlotta Newton she has a sinus infection. Caller will not be able to answer her phone until after 4 PM EST.   Nurse Assessment  Nurse: Phylliss Bobowe, RN, Synetta FailAnita Date/Time Lamount Cohen(Eastern Time): 05/22/2014 4:11:49 PM  Confirm and document reason for call. If symptomatic, describe symptoms. ---Caller states she thinks she has a sinus infection. Caller will not be able to answer her phone until after 4 PM EST. caller stated that she has been has fever 2 days ago and has had fever low grade that was 99.0 and 100.0 per orally and has had a runny nose with green drainage and has cough with congestion slight drainage with green phlegm and has had headache that has been there above her eyes and has the back of her head that has been there of the the past 3 weeks and has had ear pain in both ears and has been having popping and has been no sore throat and has slight loss of appetite and been able to take fluids fairly well  Has the patient traveled out of the country within the last 30 days? ---No  Does the patient require triage? ---Yes  Related visit to physician within the last 2 weeks? ---No  Does the PT have any chronic conditions? (i.e. diabetes, asthma, etc.) ---No  Did the patient indicate they were pregnant? ---No     Guidelines    Guideline Title Affirmed Question Affirmed Notes  Cough - Acute Productive Earache    Final Disposition User   See Physician within 24 Hours Rowe, RN, Synetta Failnita    Comments  call placed to the office back line at [phone] and spoke with Helmut MusterAlicia and informed her of the patient status and the request for a medicine for her ear

## 2014-05-22 NOTE — Telephone Encounter (Signed)
Needs to be triaged.

## 2014-05-25 ENCOUNTER — Ambulatory Visit (INDEPENDENT_AMBULATORY_CARE_PROVIDER_SITE_OTHER): Payer: BLUE CROSS/BLUE SHIELD | Admitting: Internal Medicine

## 2014-05-25 ENCOUNTER — Encounter: Payer: Self-pay | Admitting: Internal Medicine

## 2014-05-25 VITALS — BP 130/80 | Temp 98.8°F | Ht 63.0 in | Wt 117.0 lb

## 2014-05-25 DIAGNOSIS — J329 Chronic sinusitis, unspecified: Secondary | ICD-10-CM

## 2014-05-25 DIAGNOSIS — F4322 Adjustment disorder with anxiety: Secondary | ICD-10-CM | POA: Diagnosis not present

## 2014-05-25 DIAGNOSIS — L503 Dermatographic urticaria: Secondary | ICD-10-CM | POA: Diagnosis not present

## 2014-05-25 MED ORDER — ALPRAZOLAM 0.25 MG PO TABS: 0.2500 mg | ORAL_TABLET | Freq: Three times a day (TID) | ORAL | Status: DC | PRN

## 2014-05-25 MED ORDER — MONTELUKAST SODIUM 10 MG PO TABS: 10.0000 mg | ORAL_TABLET | Freq: Every day | ORAL | Status: DC

## 2014-05-25 MED ORDER — PREDNISONE 20 MG PO TABS: ORAL_TABLET | ORAL | Status: DC

## 2014-05-25 MED ORDER — CEFUROXIME AXETIL 500 MG PO TABS: 500.0000 mg | ORAL_TABLET | Freq: Two times a day (BID) | ORAL | Status: DC

## 2014-05-25 NOTE — Patient Instructions (Addendum)
Sounds like  Chronic  Sinusitis. Start singulair  Incase helps . Can add antibiotic if needed .  Advise seeing allergist  again  Dr Bertram SavinKoslow et al

## 2014-05-25 NOTE — Progress Notes (Signed)
Pre visit review using our clinic review tool, if applicable. No additional management support is needed unless otherwise documented below in the visit note.  Chief Complaint  Patient presents with  . Sinus Pain/Pressure    Traveling to [place] next week  . Headache  . Ear Pain  . Hoarse    HPI: Patient Terri [name]  comes in today for SDA for  new problem evaluation. Sinus stuff going on  But [place] .  Ear pain and stopped up  woreid about plane flight  Pressure and green stuff and slgiht bleed.  Face tenderness taking  flonase   Steroid make her 'crazy side effects ) ceftin helped last time  ROS: See pertinent positives and negatives per HPI. No cop sob had pain right back opf head neck and better with massage  No wheezing cough  Would like alpa for flight if needed  Didn't  get the one in jan filled   Past Medical History  Diagnosis Date  . Anxiety   . Depression   . GERD (gastroesophageal reflux disease)   . Headache(784.0)   . Recurrent sinusitis     ent and pulm eval in past.  . Environmental allergies     Family History  Problem Relation Age of Onset  . Heart disease      fhx    History   Social History  . Marital Status: Single    Spouse Name: N/A  . Number of Children: N/A  . Years of Education: N/A   Social History Main Topics  . Smoking status: Never Smoker   . Smokeless tobacco: Not on file  . Alcohol Use: Yes     Comment: Occasional use  . Drug Use: No  . Sexual Activity: Yes    Birth Control/ Protection: Pill   Other Topics Concern  . None   Social History Narrative   esthetician   Non smoker       Medication List       This list is accurate as of: 05/25/14 12:30 PM.  Always use your most recent med list.               ALPRAZolam 0.25 MG tablet  Commonly known as:  XANAX  Take 1 tablet (0.25 mg total) by mouth 3 (three) times daily as needed for anxiety (panic).     buPROPion 150 MG 24 hr tablet    Commonly known as:  WELLBUTRIN XL  TAKE 2 TABLETS BY MOUTH EVERY DAY AS DIRECTED     CALCIUM PO  Take 1,000 mg by mouth.     cefUROXime 500 MG tablet  Commonly known as:  CEFTIN  Take 1 tablet (500 mg total) by mouth 2 (two) times daily with a meal.     fish oil-omega-3 fatty acids 1000 MG capsule  Take 1 g by mouth daily.     fluticasone 50 MCG/ACT nasal spray  Commonly known as:  FLONASE  USE 2 SPRAYS IN EACH NOSTRIL ONCE DAILY     folic acid 1 MG tablet  Commonly known as:  FOLVITE  Take 1 mg by mouth daily.     LYSINE PO  Take 1,000 mg by mouth daily. for fever blisters.     montelukast 10 MG tablet  Commonly known as:  SINGULAIR  Take 1 tablet (10 mg total) by mouth at bedtime.     norethindrone-ethinyl estradiol 1-20 MG-MCG tablet  Commonly known as:  JUNEL FE,GILDESS FE,LOESTRIN  FE  Take 1 tablet by mouth daily.     predniSONE 20 MG tablet  Commonly known as:  DELTASONE  Take 1 po bid for 3-5 days     SUMAtriptan 100 MG tablet  Commonly known as:  IMITREX  Take 1 at onset of  Migraine headache   and can repeat in 2 hours .     traMADol-acetaminophen 37.5-325 MG per tablet  Commonly known as:  ULTRACET  Take 1 tablet by mouth every 6 (six) hours as needed.     valACYclovir 1000 MG tablet  Commonly known as:  VALTREX  TAKE 1 TABLET (1,000 MG TOTAL) BY MOUTH 2 (TWO) TIMES DAILY.     vitamin C 500 MG tablet  Commonly known as:  ASCORBIC ACID  Take 500 mg by mouth daily.     VITAMIN-B COMPLEX PO  Take by mouth.        EXAM:  BP 130/80 mmHg  Temp(Src) 98.8 F (37.1 C) (Oral)  Ht 5\' 3"  (1.6 m)  Wt 117 lb (53.071 kg)  BMI 20.73 kg/m2  Body mass index is 20.73 kg/(m^2). WDWN in NAD  quiet respirations; mildly congested  somewhat hoarse. Non toxic . Lots os throat clearing looks allergic  HEENT: Normocephalic ;atraumatic , Eyes;  PERRL, EOMs  Full, lids and conjunctiva clear,,Ears: no deformities, canals nl, TM landmarks normal, Nose: no deformity  or discharge congested  but congested;face max and frontal  tender Mouth : OP clear without lesion or edema . Neck: Supple without adenopathy or masses or bruits Chest:  Clear to A&P without wheezes rales or rhonchi CV:  S1-S2 no gallops or murmurs peripheral perfusion is normal Skin :nl perfusion and no acute rashes  PSYCH: pleasant and cooperative, no obvious depression or anxiety  ASSESSMENT AND PLAN:  Discussed the following assessment and plan:  Recurrent sinusitis - add singulair  try steroid if bad  if needed  Dermatographism - hx hives also   still seems like she has allergic phenom   Adjustment disorder with anxiety situational flying  - caution as needed med   Refill alprazolam for flight  To [place] alone . Know caution pred has se no sleep and jittery but may try after trip  Advise see allergy again and of ent  She is certainl congested most day ss ? Other interventions ? Another ent check?  tria l  going back on  singulair -Patient advised to return or notify health care team  if symptoms worsen ,persist or new concerns arise.  Patient Instructions  Sounds like  Chronic  Sinusitis. Start singulair  Incase helps . Can add antibiotic if needed .  Advise seeing allergist  again  Dr Bertram SavinKoslow et al    Neta MendsWanda K. Panosh M.D.

## 2014-06-01 ENCOUNTER — Other Ambulatory Visit: Payer: Self-pay | Admitting: Internal Medicine

## 2014-06-02 NOTE — Telephone Encounter (Signed)
Why 2 requests   Ok to refill for 6 months  Ov or pcps before runs out

## 2014-06-03 NOTE — Telephone Encounter (Signed)
Sent to the pharmacy by e-scribe. 

## 2014-07-25 ENCOUNTER — Other Ambulatory Visit: Payer: Self-pay | Admitting: Internal Medicine

## 2014-07-28 NOTE — Telephone Encounter (Signed)
Sent to the pharmacy by e-scribe. 

## 2014-11-24 ENCOUNTER — Other Ambulatory Visit: Payer: Self-pay | Admitting: Internal Medicine

## 2014-12-27 ENCOUNTER — Other Ambulatory Visit: Payer: Self-pay | Admitting: Internal Medicine

## 2014-12-29 NOTE — Telephone Encounter (Signed)
When do you want to see the pt back in OV?

## 2014-12-30 NOTE — Telephone Encounter (Signed)
Can refill for 6 months   Schedule OV  In 4-6 months before refills run out

## 2014-12-30 NOTE — Telephone Encounter (Signed)
Sent to the pharmacy.  Reminder sent to pop up for follow up appt.

## 2015-04-20 ENCOUNTER — Telehealth: Payer: Self-pay | Admitting: Family Medicine

## 2015-04-20 ENCOUNTER — Other Ambulatory Visit: Payer: Self-pay | Admitting: Internal Medicine

## 2015-04-20 ENCOUNTER — Other Ambulatory Visit: Payer: Self-pay | Admitting: Family Medicine

## 2015-04-20 DIAGNOSIS — Z0000 Encounter for general adult medical examination without abnormal findings: Secondary | ICD-10-CM

## 2015-04-20 NOTE — Telephone Encounter (Signed)
Sent to the pharmacy by e-scribe.  Message sent to scheduling.  Now due for lab work and cpx.

## 2015-04-20 NOTE — Telephone Encounter (Signed)
Pt due for cpx and lab work.  I have placed the lab orders.  Please help her to make both appointments.  Thanks!! 

## 2015-04-21 NOTE — Telephone Encounter (Signed)
lmom for pt to call back

## 2015-04-27 NOTE — Telephone Encounter (Signed)
lmom for pt to call back

## 2015-04-29 ENCOUNTER — Telehealth: Payer: Self-pay | Admitting: Internal Medicine

## 2015-04-29 NOTE — Telephone Encounter (Signed)
Error/ltd ° °

## 2015-04-29 NOTE — Telephone Encounter (Signed)
lmom for pt to call back

## 2015-04-29 NOTE — Telephone Encounter (Signed)
Pt stated that she has never done a physical here so she is declining to have a physical and lab work.  Pt would like to talk about her medications only.

## 2015-04-29 NOTE — Telephone Encounter (Signed)
Pt was already sch'd for 4/18 per patient only time that she is available to come in due to work sch.  On 4/18 3:30 was blocked for extra time.

## 2015-04-29 NOTE — Telephone Encounter (Signed)
Ok to schedule for medication follow up.  Please allow 30 minutes.

## 2015-05-04 DIAGNOSIS — H1045 Other chronic allergic conjunctivitis: Secondary | ICD-10-CM | POA: Diagnosis not present

## 2015-05-05 ENCOUNTER — Ambulatory Visit: Payer: BLUE CROSS/BLUE SHIELD | Admitting: Internal Medicine

## 2015-05-11 ENCOUNTER — Encounter: Payer: Self-pay | Admitting: Internal Medicine

## 2015-05-11 ENCOUNTER — Ambulatory Visit (INDEPENDENT_AMBULATORY_CARE_PROVIDER_SITE_OTHER): Payer: BLUE CROSS/BLUE SHIELD | Admitting: Internal Medicine

## 2015-05-11 VITALS — BP 116/70 | Temp 98.7°F | Ht 62.5 in | Wt 117.9 lb

## 2015-05-11 DIAGNOSIS — Z79899 Other long term (current) drug therapy: Secondary | ICD-10-CM | POA: Diagnosis not present

## 2015-05-11 DIAGNOSIS — M25552 Pain in left hip: Secondary | ICD-10-CM | POA: Diagnosis not present

## 2015-05-11 DIAGNOSIS — F4323 Adjustment disorder with mixed anxiety and depressed mood: Secondary | ICD-10-CM

## 2015-05-11 DIAGNOSIS — J329 Chronic sinusitis, unspecified: Secondary | ICD-10-CM

## 2015-05-11 MED ORDER — ALPRAZOLAM 0.25 MG PO TABS: 0.2500 mg | ORAL_TABLET | Freq: Three times a day (TID) | ORAL | Status: DC | PRN

## 2015-05-11 MED ORDER — BUPROPION HCL ER (XL) 150 MG PO TB24: 300.0000 mg | ORAL_TABLET | Freq: Every day | ORAL | Status: DC

## 2015-05-11 MED ORDER — MONTELUKAST SODIUM 10 MG PO TABS: 10.0000 mg | ORAL_TABLET | Freq: Every day | ORAL | Status: DC

## 2015-05-11 NOTE — Progress Notes (Signed)
Pre visit review using our clinic review tool, if applicable. No additional management support is needed unless otherwise documented below in the visit note.  Chief Complaint  Patient presents with  . Follow-up    Medication    HPI: Terri [name] 50 y.o. Terri [name]  gets yearly labs .    Terri [name] med disease management. Mood:  wellbutrin recently .    seems like different   Weird feeling.   Felt like stop taking effexor   WD.   Added qod extra  450   And felt normal .  Back down to 2  Per day   Pharmacist said   No chang e in the generic   Had 3  Panic in one day .   [place] [employer]  Takes a 1/4 alpraz  for this .  Uncertain why a happened  To get a new bottle   Resp" Congestion.     and allergy   Doing rinses after outside  Exercise flonase   Headaches   Sinus related  And better   Left hi[p bothering her again cant run anymore has been to 5 ortho etc    Pain coming backl ateral?   Weight bearing makes worse .   Stretches yoga helps some  ROS: See pertinent positives and negatives per HPI. hh of 1  Past Medical History  Diagnosis Date  . Anxiety   . Depression   . GERD (gastroesophageal reflux disease)   . Headache(784.0)   . Recurrent sinusitis     ent and pulm eval in past.  . Environmental allergies     Family History  Problem Relation Age of Onset  . Heart disease      fhx    Social History   Social History  . Marital Status: Single    Spouse Name: N/A  . Number of Children: N/A  . Years of Education: N/A   Social History Main Topics  . Smoking status: Never Smoker   . Smokeless tobacco: None  . Alcohol Use: Yes     Comment: Occasional use  . Drug Use: No  . Sexual Activity: Yes    Birth Control/ Protection: Pill   Other Topics Concern  . None   Social History Narrative   esthetician   Non smoker    Outpatient Prescriptions Prior to Visit  Medication Sig Dispense Refill  . B Complex Vitamins (VITAMIN-B COMPLEX PO) Take by mouth.    . fish  oil-omega-3 fatty acids 1000 MG capsule Take 1 g by mouth daily.      . fluticasone (FLONASE) 50 MCG/ACT nasal spray USE 2 SPRAYS IN EACH NOSTRIL ONCE DAILY 16 g 1  . folic acid (FOLVITE) 1 MG tablet Take 1 mg by mouth daily.    [name]. LYSINE PO Take 1,000 mg by mouth daily. for fever blisters.    . norethindrone-ethinyl estradiol (JUNEL FE 1/20) 1-20 MG-MCG per tablet Take 1 tablet by mouth daily.      . SUMAtriptan (IMITREX) 100 MG tablet Take 1 at onset of  Migraine headache   and can repeat in 2 hours . 9 tablet 1  . traMADol-acetaminophen (ULTRACET) 37.5-325 MG per tablet Take 1 tablet by mouth every 6 (six) hours as needed.    . valACYclovir (VALTREX) 1000 MG tablet TAKE 1 TABLET (1,000 MG TOTAL) BY MOUTH 2 (TWO) TIMES DAILY. 30 tablet 1  . vitamin C (ASCORBIC ACID) 500 MG tablet Take 500 mg by mouth daily.    .[name]  ALPRAZolam (XANAX) 0.25 MG tablet Take 1 tablet (0.25 mg total) by mouth 3 (three) times daily as needed for anxiety (panic). 20 tablet 0  . buPROPion (WELLBUTRIN XL) 150 MG 24 hr tablet TAKE 2 TABLETS BY MOUTH EVERY DAY AS DIRECTED 60 tablet 5  . montelukast (SINGULAIR) 10 MG tablet Take 1 tablet (10 mg total) by mouth at bedtime. 30 tablet 3  . CALCIUM PO Take 1,000 mg by mouth.    . cefUROXime (CEFTIN) 500 MG tablet Take 1 tablet (500 mg total) by mouth 2 (two) times daily with a meal. 14 tablet 0  . predniSONE (DELTASONE) 20 MG tablet Take 1 po bid for 3-5 days 10 tablet 0   No facility-administered medications prior to visit.     EXAM:  BP 116/70 mmHg  Temp(Src) 98.7 F (37.1 C) (Oral)  Ht 5' 2.5" (1.588 m)  Wt 117 lb 14.4 oz (53.479 kg)  BMI 21.21 kg/m2  Body mass index is 21.21 kg/(m^2).  GENERAL: vitals reviewed and listed above, alert, oriented, appears well hydrated and in no acute distress  Mild congestion HEENT: atraumatic, conjunctiva  clear, no obvious abnormalities on inspection of external nose and ears OP : no lesion edema or exudate  Face nontender  NECK:  no obvious masses on inspection palpation  LUNGS: clear to auscultation bilaterally, no wheezes, rales or rhonchi, good air movement CV: HRRR, no clubbing cyanosis or  peripheral edema nl cap refill  MS: moves all extremities without noticeable focal  Abnormality nl gait  PSYCH: pleasant and cooperative, no obvious depression or anxiety  ASSESSMENT AND PLAN:  Discussed the following assessment and plan:  Adjustment reaction with anxiety and depression - stable  suspect ? issues with generic delivery?   ask pharmacy about different generic if continues rare use of alpraz  Recurrent sinusitis - stable   add singulair for prevention in season  Medication management  Left hip pain - getting worse again hx of surgery  disc  poss opinions  Dr. Lucretia Field etc  Can call if  Problems otherwise yearly check and remain on meds as planned -Patient advised to return or notify health care team  if symptoms worsen ,persist or new concerns arise. Total visit > 50% spent counseling and coordinating care as indicated in above note and in instructions to patient .   Patient Instructions    Stay on flonase and add daily  Singulair in season.       To prevent  flair   [name]. Panosh M.D.

## 2015-05-11 NOTE — Patient Instructions (Signed)
   Stay on flonase and add daily  Singulair in season.       To prevent  flair

## 2015-06-10 ENCOUNTER — Ambulatory Visit (INDEPENDENT_AMBULATORY_CARE_PROVIDER_SITE_OTHER): Payer: BLUE CROSS/BLUE SHIELD | Admitting: Family Medicine

## 2015-06-10 ENCOUNTER — Encounter: Payer: Self-pay | Admitting: Family Medicine

## 2015-06-10 VITALS — BP 120/72 | HR 93 | Ht 62.5 in | Wt 117.0 lb

## 2015-06-10 DIAGNOSIS — M9903 Segmental and somatic dysfunction of lumbar region: Secondary | ICD-10-CM | POA: Diagnosis not present

## 2015-06-10 DIAGNOSIS — M9902 Segmental and somatic dysfunction of thoracic region: Secondary | ICD-10-CM

## 2015-06-10 DIAGNOSIS — M999 Biomechanical lesion, unspecified: Secondary | ICD-10-CM | POA: Insufficient documentation

## 2015-06-10 DIAGNOSIS — M533 Sacrococcygeal disorders, not elsewhere classified: Secondary | ICD-10-CM

## 2015-06-10 DIAGNOSIS — M9904 Segmental and somatic dysfunction of sacral region: Secondary | ICD-10-CM

## 2015-06-10 DIAGNOSIS — Q796 Ehlers-Danlos syndrome: Secondary | ICD-10-CM

## 2015-06-10 DIAGNOSIS — M357 Hypermobility syndrome: Secondary | ICD-10-CM | POA: Insufficient documentation

## 2015-06-10 NOTE — Assessment & Plan Note (Signed)
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 We do not need further imaging at this time. Patient did respond fairly well to osteopathic manipulation. I do believe that patient has other multifactorial problems that is also contributing. I believe the patient may have a possible underlying eating disorder that could be contributing with patient when discussing diet has some unusual ideas about a healthy diet. In addition to this patient does have laxity of multiple joints and I am somewhat concerned of a connective tissue disorder or even an underlying autoimmune disorder. Reviewing patient's chart I do not see any report workup that when or has been done. Patient at this point would like to hold on any type of labs. We did discuss some over-the-counter medications. We discussed some topical anti-inflammatories that I think could be beneficial. We discussed icing regimen. Patient then will come back and see me again in 3-4 weeks. We will attempt osteopathic manipulation again and likely get labs.

## 2015-06-10 NOTE — Progress Notes (Signed)
Terri [name] D.O. [facility] [address] [place], [place] [address] Phone: [phone] Subjective:    I'm seeing this patient by the request  of:  Lorretta HarpPANOSH,WANDA KOTVAN, MD   CC: low back pain  [name] is a 50 y.o. female coming in with complaint of low back pain. Patient is a 50 year old female who is seen multiple different providers for this problem previously. Patient has been diagnosed with such things as degenerative disc disease at L5-S1 as well as a labral tear that had had surgery in 2013 of her left hip.patient has had numerous epidural injections in the back with no significant improvement. Continues to attempt to be active but finds it very difficult. Has been doing yoga on a regular basis but finds that even this is causing more severe pain. Can give her trouble even at her job and when she is in a flexed position for a long amount of time. States that this sometimes seems to be radiation going down her leg. Patient has tried multiple different changes and even change her diet with very mild improvement. States that severity of pain seems to be possibly worsen over the course of time in the one has a good answer for her. Reviewed patient's notes previously from 2013. [facility]. Patient has done formal physical therapy multiple times.   has been seen a chiropractor with mild improvement.  Past Medical History  Diagnosis Date  . Anxiety   . Depression   . GERD (gastroesophageal reflux disease)   . Headache(784.0)   . Recurrent sinusitis     ent and pulm eval in past.  . Environmental allergies    Past Surgical History  Procedure Laterality Date  . Left hip surgery      [facility]  . Hip arthroscopy w/ labral debridement     Social History   Social History  . Marital Status: Single    Spouse Name: N/A  . Number of Children: N/A  . Years of Education: N/A   Social History Main Topics  . Smoking status: Never  Smoker   . Smokeless tobacco: None  . Alcohol Use: Yes     Comment: Occasional use  . Drug Use: No  . Sexual Activity: Yes    Birth Control/ Protection: Pill   Other Topics Concern  . 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 Augmentin [Amoxicillin-Pot Clavulanate] Nausea Only  . Celecoxib     REACTION: unspecified  . Sulfonamide Derivatives     REACTION: hives, throat swelling  . Monistat [Miconazole] Other (See Comments)    Sensitive to topical vaginal  Azoles No side effects with oral Diflucan   Family History  Problem Relation Age of Onset  . Heart disease      fhx    Past medical history, social, surgical and family history all reviewed in electronic medical record.  No pertanent information unless stated regarding to the chief complaint.   Review of Systems: No headache, visual changes, nausea, vomiting, diarrhea, constipation, dizziness, abdominal pain, skin rash, fevers, chills, night sweats, weight loss, swollen lymph nodes, body aches, joint swelling, muscle aches, chest pain, shortness of breath, mood changes.   Objective Blood pressure 120/72, pulse 93, height 5' 2.5" (1.588 m), weight 117 lb (53.071 kg), SpO2 96 %.  General: No apparent distress alert and oriented x3 mood  and affect normal, dressed appropriately.  HEENT: Pupils equal, extraocular movements intact  Respiratory: Patient's speak in full sentences and does not appear short of breath  Cardiovascular: No lower extremity edema, non tender, no erythema  Skin: Warm dry intact with no signs of infection or rash on extremities or on axial skeleton.  Abdomen: Soft nontender  Neuro: Cranial nerves II through XII are intact, neurovascularly intact in all extremities with 2+ DTRs and 2+ pulses.  Lymph: No lymphadenopathy of posterior or anterior cervical chain or axillae bilaterally.  Gait normal with good balance and coordination.  MSK:   Non tender with full range of motion and good stability and symmetric strength and tone of shoulders, elbows, wrist, , knee and ankles bilaterally.  Beighton score of 7  Patient's hips at baseline or externally rotated bilaterally. Significant laxity of multiple joints. Patient's low back pain shows severe tenderness over the left SI joint and does have a positive Faber test. Tightness noted over the piriformis muscle. Negative straight leg test with tight hamstrings bilaterally left couldn't and right  Osteopathic findings C2 flexed rotated and side bent left C4 flexed rotated and side bent right T3 extended rotated and side bent right L2 flexed rotated and side bent right Sacrum left on left with positive seated flexion  Sacroiliac Joint Mobilization and Rehab 1. Work on pretzel stretching, shoulder back and leg draped in front. 3-5 sets, 30 sec.. 2. hip abductor rotations. standing, hip flexion and rotation outward then inward. 3 sets, 15 reps. when can do comfortably, add ankle weights starting at 2 pounds.  3. cross over stretching - shoulder back to ground, same side leg crossover. 3-5 sets for 30 min..  4. rolling up and back knees to chest and rocking. 5. sacral tilt - 5 sets, hold for 5-10 seconds    Impression and Recommendations:     This case required medical decision making of moderate complexity.      Note: This dictation was prepared with Dragon dictation along with smaller phrase technology. Any transcriptional errors that result from this process are unintentional.

## 2015-06-10 NOTE — Assessment & Plan Note (Signed)
Decision today to treat with OMT was based on Physical Exam  After verbal consent patient was treated with HVLA, ME techniques in thoracic, lumbar and sacral areas  Patient tolerated the procedure well with improvement in symptoms  Patient given exercises, stretches and lifestyle modifications  See medications in patient instructions if given  Patient will follow up in 3-4 weeks  

## 2015-06-10 NOTE — Patient Instructions (Signed)
Ice 20 minutes 2 times daily. Usually after activity and before bed. Exercises 3 times a week want you to do more weight training and less stretching Turmeric, vitamin d and tart cherry extract is great  Vega sport protein-  2 smoothies daily in addition to your other meals.  Need around 100grams of protein daily  Try to get results of EMG if you can  We will continue to work on your SI joint.  See me again in 4 weeks and if not better I do think we will need some labs and see what else could be contributing.

## 2015-06-10 NOTE — Progress Notes (Signed)
Pre visit review using our clinic review tool, if applicable. No additional management support is needed unless otherwise documented below in the visit note. 

## 2015-06-30 DIAGNOSIS — M9902 Segmental and somatic dysfunction of thoracic region: Secondary | ICD-10-CM | POA: Diagnosis not present

## 2015-06-30 DIAGNOSIS — M531 Cervicobrachial syndrome: Secondary | ICD-10-CM | POA: Diagnosis not present

## 2015-06-30 DIAGNOSIS — M7700 Medial epicondylitis, unspecified elbow: Secondary | ICD-10-CM | POA: Diagnosis not present

## 2015-07-05 DIAGNOSIS — M9901 Segmental and somatic dysfunction of cervical region: Secondary | ICD-10-CM | POA: Diagnosis not present

## 2015-07-05 DIAGNOSIS — M9902 Segmental and somatic dysfunction of thoracic region: Secondary | ICD-10-CM | POA: Diagnosis not present

## 2015-07-05 DIAGNOSIS — M531 Cervicobrachial syndrome: Secondary | ICD-10-CM | POA: Diagnosis not present

## 2015-07-05 DIAGNOSIS — M7700 Medial epicondylitis, unspecified elbow: Secondary | ICD-10-CM | POA: Diagnosis not present

## 2015-07-08 ENCOUNTER — Encounter: Payer: Self-pay | Admitting: Family Medicine

## 2015-07-08 ENCOUNTER — Other Ambulatory Visit: Payer: Self-pay

## 2015-07-08 ENCOUNTER — Ambulatory Visit (INDEPENDENT_AMBULATORY_CARE_PROVIDER_SITE_OTHER): Payer: BLUE CROSS/BLUE SHIELD | Admitting: Family Medicine

## 2015-07-08 VITALS — BP 112/62 | HR 84 | Ht 62.5 in | Wt 117.0 lb

## 2015-07-08 DIAGNOSIS — M2550 Pain in unspecified joint: Secondary | ICD-10-CM

## 2015-07-08 DIAGNOSIS — M5432 Sciatica, left side: Secondary | ICD-10-CM

## 2015-07-08 DIAGNOSIS — M4698 Unspecified inflammatory spondylopathy, sacral and sacrococcygeal region: Secondary | ICD-10-CM | POA: Diagnosis not present

## 2015-07-08 DIAGNOSIS — M79605 Pain in left leg: Secondary | ICD-10-CM

## 2015-07-08 DIAGNOSIS — M533 Sacrococcygeal disorders, not elsewhere classified: Secondary | ICD-10-CM

## 2015-07-08 DIAGNOSIS — M47818 Spondylosis without myelopathy or radiculopathy, sacral and sacrococcygeal region: Secondary | ICD-10-CM | POA: Insufficient documentation

## 2015-07-08 DIAGNOSIS — M5431 Sciatica, right side: Secondary | ICD-10-CM

## 2015-07-08 NOTE — Progress Notes (Signed)
Pre visit review using our clinic review tool, if applicable. No additional management support is needed unless otherwise documented below in the visit note. 

## 2015-07-08 NOTE — Progress Notes (Signed)
[name] [address] [place], [place] [address] Phone: [phone] Subjective:    I'm seeing this patient by the request  of:  Lorretta Harp, MD   CC: low back pain f/u  BJY:NWGNFAOZHY Terri [name] is a 50 y.o. female coming in with complaint of low back pain. Patient is a 50 year old female who is seen multiple different providers for this problem previously. Patient has been diagnosed with such things as degenerative disc disease at L5-S1 as well as a labral tear that had had surgery in 2013 of her left hip.patient has had numerous epidural injections in the back with no significant improvement. We attempted osteopathic manipulation and home exercises focusing on the sacroiliac joint the patient states seems to have exacerbated the problem. Having more pain seems to be going down both legs some time to time. Left leg still gives her more difficulty. Seems to be more on the upper aspect just inferior to the buttock cleft. Patient states that it is affecting daily activities.  Patient is concerned because she has had an fairly significant workup in many different areas with no significant findings at the moment. States that she did have an EMG from an outside facility. States that has been sent to [place]. We do not have it.   has been seen a chiropractor with mild improvement.  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 Last x-rays were in 2005.  Past Medical History  Diagnosis Date  . Anxiety   . Depression   . GERD (gastroesophageal reflux disease)   . Headache(784.0)   . Recurrent sinusitis     ent and pulm eval in past.  . Environmental allergies    Past Surgical History  Procedure Laterality Date  . Left hip surgery      [facility]  . Hip arthroscopy w/ labral debridement     Social History   Social History  . Marital Status: Single    Spouse Name: N/A  . Number of Children: N/A  .  Years of Education: N/A   Social History Main Topics  . Smoking status: Never Smoker   . Smokeless tobacco: None  . Alcohol Use: Yes     Comment: Occasional use  . Drug Use: No  . Sexual Activity: Yes    Birth Control/ Protection: Pill   Other Topics Concern  . 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 Augmentin [Amoxicillin-Pot Clavulanate] Nausea Only  . Celecoxib     REACTION: unspecified  . Sulfonamide Derivatives     REACTION: hives, throat swelling  . Monistat [Miconazole] Other (See Comments)    Sensitive to topical vaginal  Azoles No side effects with oral Diflucan   Family History  Problem Relation Age of Onset  . Heart disease      fhx    Past medical history, social, surgical and family history all reviewed in electronic medical record.  No pertanent information unless stated regarding to the chief complaint.   Review of Systems: No headache, visual changes, nausea, vomiting, diarrhea, constipation, dizziness, abdominal pain, skin rash, fevers, chills, night sweats, weight loss, swollen lymph nodes, body aches, joint swelling, muscle aches, chest pain, shortness of breath, mood changes.   Objective Blood pressure 112/62, pulse 84, height 5' 2.5" (1.588 m), weight 117 lb (53.071 kg), SpO2 99 %.  General: No apparent distress alert and oriented x3 mood and affect normal, dressed appropriately.  HEENT: Pupils equal, extraocular movements intact  Respiratory: Patient's speak in full sentences and does not appear short of breath  Cardiovascular: No lower extremity edema, non tender, no erythema  Skin: Warm dry intact with no signs of infection or rash on extremities or on axial skeleton.  Abdomen: Soft nontender  Neuro: Cranial nerves II through XII are intact, neurovascularly intact in all extremities with 2+ DTRs and 2+ pulses.  Lymph: No lymphadenopathy of posterior or anterior cervical chain  or axillae bilaterally.  Gait Severely antalgic gait.  MSK:  Non tender with full range of motion and good stability and symmetric strength and tone of shoulders, elbows, wrist, , knee and ankles bilaterally.  Beighton score of 7  Patient's hips at baseline or externally rotated bilaterally. Significant laxity of multiple joints. Patient's low back pain shows severe tenderness over the leftAnd right SI joint and does have a positive Faber test. Tightness noted over the piriformis muscle. Negative straight leg test with tight hamstrings bilaterally left couldn't and right Patient does have tenderness to palpation over the left ischial bursa area.  Limited muscular skeletal ultrasound was performed and interpreted by .me  Limited ultrasound the patient's left hamstring area at the insertion shows no significant tendinopathy. No signs of an avulsion fracture. No early increase in Doppler flow. Impression: Normal left hamstring     Impression and Recommendations:     This case required medical decision making of moderate complexity.      Note: This dictation was prepared with Dragon dictation along with smaller phrase technology. Any transcriptional errors that result from this process are unintentional.

## 2015-07-08 NOTE — Patient Instructions (Signed)
I am sorry we do not have a good answer for you Ice I think can still help When you can pease get labs and xrays downstairs.  This will get us some good information Sign up for my chart so I can release the information to you.  Keep being active but decrease my exercises if they are making things worse.  Continue the vitamins at this time.  I want to see you again in 3 weeks and at that time we will try injection in the SI joint and possible hamstring if we have no answer.

## 2015-07-08 NOTE — Assessment & Plan Note (Signed)
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 Did not respond very well to osteopathic manipulation. I do think that there could be an underlying autoimmune worse possible connective tissue disorder that could be contribute in. Patient will have labs today and will x-rays in the near future. Patient does have another appointment she states she has to get to. 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 Discussed with patient we will work with laboratory radiology workup first and then consider possible injection in the SI joint for possible therapeutic and diagnostic treatment if patient's labs come back unremarkable. Spent  25 minutes with patient face-to-face and had greater than 50% of counseling including as described above in assessment and plan.

## 2015-07-08 NOTE — Assessment & Plan Note (Signed)
Patient has had pain radiating down her legs. Given tightness of the hamstrings. Does not want advance imaging. Does not want any more epidurals and once to avoid back surgery. Exam very difficult to treat at this moment. We discussed possible gabapentin but she has had side effects to many different medications he says. Would consider that if we have difficulty for long amount of time.

## 2015-07-12 ENCOUNTER — Other Ambulatory Visit (INDEPENDENT_AMBULATORY_CARE_PROVIDER_SITE_OTHER): Payer: BLUE CROSS/BLUE SHIELD

## 2015-07-12 DIAGNOSIS — M2550 Pain in unspecified joint: Secondary | ICD-10-CM

## 2015-07-12 LAB — COMPREHENSIVE METABOLIC PANEL
ALT: 20 U/L (ref 0–35)
AST: 20 U/L (ref 0–37)
Albumin: 3.9 g/dL (ref 3.5–5.2)
Alkaline Phosphatase: 39 U/L (ref 39–117)
BUN: 14 mg/dL (ref 6–23)
CO2: 22 mEq/L (ref 19–32)
Calcium: 8.8 mg/dL (ref 8.4–10.5)
Chloride: 105 mEq/L (ref 96–112)
Creatinine, Ser: 0.99 mg/dL (ref 0.40–1.20)
GFR: 62.72 mL/min (ref >60.00)
Glucose, Bld: 103 mg/dL — ABNORMAL HIGH (ref 70–99)
Potassium: 3.7 mEq/L (ref 3.5–5.1)
Sodium: 137 mEq/L (ref 135–145)
Total Bilirubin: 0.4 mg/dL (ref 0.2–1.2)
Total Protein: 6.8 g/dL (ref 6.0–8.3)

## 2015-07-12 LAB — SEDIMENTATION RATE: Sed Rate: 10 mm/hr (ref 0–30)

## 2015-07-12 LAB — CK: Total CK: 104 U/L (ref 7–177)

## 2015-07-12 LAB — RHEUMATOID FACTOR: Rhuematoid fact SerPl-aCnc: <10 IU/mL (ref <=14)

## 2015-07-12 LAB — C-REACTIVE PROTEIN: CRP: 0.3 mg/dL — ABNORMAL LOW (ref 0.5–20.0)

## 2015-07-13 LAB — ANTI-NUCLEAR AB-TITER (ANA TITER): ANA Titer 1: 1:640 {titer} — ABNORMAL HIGH

## 2015-07-13 LAB — ANA: Anti Nuclear Antibody(ANA): POSITIVE — AB

## 2015-07-13 LAB — SJOGRENS SYNDROME-B EXTRACTABLE NUCLEAR ANTIBODY: SSB (La) (ENA) Antibody, IgG: <1

## 2015-07-13 LAB — ANTI-DNA ANTIBODY, DOUBLE-STRANDED: ds DNA Ab: <1 IU/mL

## 2015-07-13 LAB — SJOGRENS SYNDROME-A EXTRACTABLE NUCLEAR ANTIBODY: SSA (Ro) (ENA) Antibody, IgG: <1

## 2015-07-14 LAB — CYCLIC CITRUL PEPTIDE ANTIBODY, IGG: Cyclic Citrullin Peptide Ab: <16 Units

## 2015-07-15 ENCOUNTER — Ambulatory Visit (INDEPENDENT_AMBULATORY_CARE_PROVIDER_SITE_OTHER)
Admission: RE | Admit: 2015-07-15 | Discharge: 2015-07-15 | Disposition: A | Discharge status: Home / Self Care | Payer: BLUE CROSS/BLUE SHIELD | Source: Ambulatory Visit | Attending: Family Medicine | Admitting: Family Medicine

## 2015-07-15 DIAGNOSIS — M47816 Spondylosis without myelopathy or radiculopathy, lumbar region: Secondary | ICD-10-CM | POA: Diagnosis not present

## 2015-07-15 DIAGNOSIS — M79605 Pain in left leg: Secondary | ICD-10-CM | POA: Diagnosis not present

## 2015-07-15 DIAGNOSIS — M25552 Pain in left hip: Secondary | ICD-10-CM | POA: Diagnosis not present

## 2015-07-20 LAB — HLA-B27 ANTIGEN: DNA Result:: NEGATIVE

## 2015-08-02 ENCOUNTER — Ambulatory Visit (INDEPENDENT_AMBULATORY_CARE_PROVIDER_SITE_OTHER): Payer: BLUE CROSS/BLUE SHIELD | Admitting: Family Medicine

## 2015-08-02 ENCOUNTER — Encounter: Payer: Self-pay | Admitting: Family Medicine

## 2015-08-02 VITALS — BP 112/72 | HR 90 | Ht 62.5 in | Wt 118.0 lb

## 2015-08-02 DIAGNOSIS — R768 Other specified abnormal immunological findings in serum: Secondary | ICD-10-CM | POA: Insufficient documentation

## 2015-08-02 DIAGNOSIS — Q796 Ehlers-Danlos syndrome: Secondary | ICD-10-CM

## 2015-08-02 DIAGNOSIS — M357 Hypermobility syndrome: Secondary | ICD-10-CM

## 2015-08-02 DIAGNOSIS — M533 Sacrococcygeal disorders, not elsewhere classified: Secondary | ICD-10-CM

## 2015-08-02 DIAGNOSIS — M9904 Segmental and somatic dysfunction of sacral region: Secondary | ICD-10-CM

## 2015-08-02 DIAGNOSIS — M9902 Segmental and somatic dysfunction of thoracic region: Secondary | ICD-10-CM

## 2015-08-02 DIAGNOSIS — M9903 Segmental and somatic dysfunction of lumbar region: Secondary | ICD-10-CM | POA: Diagnosis not present

## 2015-08-02 DIAGNOSIS — M999 Biomechanical lesion, unspecified: Secondary | ICD-10-CM

## 2015-08-02 MED ORDER — DIAZEPAM 5 MG PO TABS: ORAL_TABLET | ORAL | Status: DC

## 2015-08-02 NOTE — Patient Instructions (Signed)
Good to see you  Ice 20 minutes 2 times daily. Usually after activity and before bed. I am impressed you are making some improvement.  Keep up with everything else If you want make an appointment and we will do injection  Rheumatology will be calling you to make an appointment.  For manipulation, see me again in 4-6 weeks (30 minute appointment please)

## 2015-08-02 NOTE — Progress Notes (Signed)
Pre visit review using our clinic review tool, if applicable. No additional management support is needed unless otherwise documented below in the visit note. 

## 2015-08-02 NOTE — Assessment & Plan Note (Signed)
Referred to rheum for further workup.  [id number] negative as well as anti-CCP and double-stranded DNA negative

## 2015-08-02 NOTE — Progress Notes (Signed)
Terri [name] D.O. [facility] [address] [place], [place] [address] Phone: [phone]) [phone] Subjective:    I'm seeing this patient by the request  of:  Lorretta HarpPANOSH,WANDA KOTVAN, MD   CC: low back pain f/u  BJY:NWGNFAOZHYHPI:Subjective Terri [name] is a 50 y.o. female coming in with complaint of low back pain. Patient is a 50 year old female who is seen multiple different providers for this problem previously. Patient has been diagnosed with such things as degenerative disc disease at L5-S1 as well as a labral tear that had had surgery in 2013 of her left hip.patient has had numerous epidural injections in the back with no significant improvement. We attempted osteopathic manipulation and home exercises focusing on the sacroiliac joint.  Patient has had some mild improvement with the osteopathic manipulation. There is concern for possible underlying autoimmune disease.  Patient did have labs and does have a positive ANA with a 1-640 titer. Has not seen a rheumatologist.   has been seen a chiropractor with mild improvement.  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 Last x-rays were in 2005.  Past Medical History  Diagnosis Date  . Anxiety   . Depression   . GERD (gastroesophageal reflux disease)   . Headache(784.0)   . Recurrent sinusitis     ent and pulm eval in past.  . Environmental allergies    Past Surgical History  Procedure Laterality Date  . Left hip surgery      [facility]  . Hip arthroscopy w/ labral debridement     Social History   Social History  . Marital Status: Single    Spouse Name: N/A  . Number of Children: N/A  . Years of Education: N/A   Social History Main Topics  . Smoking status: Never Smoker   . Smokeless tobacco: None  . Alcohol Use: Yes     Comment: Occasional use  . Drug Use: No  . Sexual Activity: Yes    Birth Control/ Protection: Pill   Other Topics Concern  . 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 Augmentin [Amoxicillin-Pot Clavulanate] Nausea Only  . Celecoxib     REACTION: unspecified  . Sulfonamide Derivatives     REACTION: hives, throat swelling  . Monistat [Miconazole] Other (See Comments)    Sensitive to topical vaginal  Azoles No side effects with oral Diflucan   Family History  Problem Relation Age of Onset  . Heart disease      fhx    Past medical history, social, surgical and family history all reviewed in electronic medical record.  No pertanent information unless stated regarding to the chief complaint.   Review of Systems: No headache, visual changes, nausea, vomiting, diarrhea, constipation, dizziness, abdominal pain, skin rash, fevers, chills, night sweats, weight loss, swollen lymph nodes, body aches, joint swelling, muscle aches, chest pain, shortness of breath, mood changes.   Objective Blood pressure 112/72, pulse 90, height 5' 2.5" (1.588 m), weight 118 lb (53.524 kg), last menstrual period 07/15/2015, SpO2 98 %.  General: No apparent distress alert and oriented x3 mood and affect normal, dressed appropriately.  HEENT: Pupils equal, extraocular movements intact  Respiratory: Patient's speak in full sentences and does not appear short of breath  Cardiovascular: No lower extremity edema, non tender, no erythema  Skin: Warm dry intact with no signs of  infection or rash on extremities or on axial skeleton.  Abdomen: Soft nontender  Neuro: Cranial nerves II through XII are intact, neurovascularly intact in all extremities with 2+ DTRs and 2+ pulses.  Lymph: No lymphadenopathy of posterior or anterior cervical chain or axillae bilaterally.  Gait Severely antalgic gait.  MSK:  Non tender with full range of motion and good stability and symmetric strength and tone of shoulders, elbows, wrist, , knee and ankles bilaterally.  Beighton score of 7  Significant laxity of multiple  joints. Patient's low back pain shows severe tenderness over the leftAnd right SI joint and does have a positive Faber test. Tightness noted over the piriformis muscle. Negative straight leg test Patient does have tenderness to palpation over the left ischial bursa area.  Osteopathic findings T3 extended rotated and side bent right L3 flexed rotated and side bent left Sacrum right on right Pelvis does have a anterior left ileum    Impression and Recommendations:     This case required medical decision making of moderate complexity.      Note: This dictation was prepared with Dragon dictation along with smaller phrase technology. Any transcriptional errors that result from this process are unintentional.

## 2015-08-02 NOTE — Assessment & Plan Note (Signed)
Decision today to treat with OMT was based on Physical Exam  After verbal consent patient was treated with HVLA, ME techniques in thoracic, lumbar and sacral areas  Patient tolerated the procedure well with improvement in symptoms  Patient given exercises, stretches and lifestyle modifications  See medications in patient instructions if given  Patient will follow up in 4-6 weeks          

## 2015-08-02 NOTE — Assessment & Plan Note (Signed)
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 We discussed the importance again of strengthening and set of stretching. Discuss proper shoes. We discussed range of motion exercises. We discussed icing regimen. Patient has responded fairly well to osteopathic manipulation. We discussed we can also consider injection in the sacroiliac joint for hopefully therapeutic and likely diagnostic results. Patient will come back if she decides to do then was given a prescription for Valium in case. Patient otherwise will follow-up with me for manipulation every 4-6 weeks.

## 2015-08-16 DIAGNOSIS — M7700 Medial epicondylitis, unspecified elbow: Secondary | ICD-10-CM | POA: Diagnosis not present

## 2015-08-16 DIAGNOSIS — M531 Cervicobrachial syndrome: Secondary | ICD-10-CM | POA: Diagnosis not present

## 2015-08-16 DIAGNOSIS — M9902 Segmental and somatic dysfunction of thoracic region: Secondary | ICD-10-CM | POA: Diagnosis not present

## 2015-08-18 ENCOUNTER — Other Ambulatory Visit: Payer: Self-pay | Admitting: Internal Medicine

## 2015-08-20 DIAGNOSIS — M531 Cervicobrachial syndrome: Secondary | ICD-10-CM | POA: Diagnosis not present

## 2015-08-20 DIAGNOSIS — M9901 Segmental and somatic dysfunction of cervical region: Secondary | ICD-10-CM | POA: Diagnosis not present

## 2015-08-20 NOTE — Telephone Encounter (Signed)
Sent to the pharmacy by e-scribe. 

## 2015-08-22 NOTE — Progress Notes (Signed)
[name] [address] [place], [place] [address] Phone: [phone] Subjective:    I'm seeing this patient by the request  of:  Lorretta Harp, MD   CC: low back pain f/u  [id number]  Terri [name] is a 50 y.o. female coming in with complaint of low back pain. Patient is a 50 year old female who is seen multiple different providers for this problem previously. Patient has been diagnosed with such things as degenerative disc disease at L5-S1 as well as a labral tear that had had surgery in 2013 of her left hip.patient has had numerous epidural injections in the back with no significant improvement. We attempted osteopathic manipulation and home exercises focusing on the sacroiliac joint.  Patient did have significant improvement with her for osteopathic manipulation at last exam.  Patient did have labs and does have a positive ANA with a 1-640 titer. Has not seen a rheumatologist. Has been referred. 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 Last x-rays were in 2005.  Patient wanted to avoid any type of injection in the sacroiliac joint at this moment.  Past Medical History:  Diagnosis Date  . Anxiety   . Depression   . Environmental allergies   . GERD (gastroesophageal reflux disease)   . Headache(784.0)   . Recurrent sinusitis    ent and pulm eval in past.   Past Surgical History:  Procedure Laterality Date  . HIP ARTHROSCOPY W/ LABRAL DEBRIDEMENT    . left hip surgery     [facility]   Social History   Social History  . Marital status: Single    Spouse name: N/A  . Number of children: N/A  . Years of education: N/A   Social History Main Topics  . Smoking status: Never Smoker  . Smokeless tobacco: None  . Alcohol use Yes     Comment: Occasional use  . Drug use: No  . Sexual activity: Yes    Birth control/ protection: Pill   Other  Topics Concern  . 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 Augmentin [Amoxicillin-Pot Clavulanate] Nausea Only  . Celecoxib     REACTION: unspecified  . Sulfonamide Derivatives     REACTION: hives, throat swelling  . Monistat [Miconazole] Other (See Comments)    Sensitive to topical vaginal  Azoles No side effects with oral Diflucan   Family History  Problem Relation Age of Onset  . Heart disease      fhx    Past medical history, social, surgical and family history all reviewed in electronic medical record.  No pertanent information unless stated regarding to the chief complaint.   Review of Systems: No headache, visual changes, nausea, vomiting, diarrhea, constipation, dizziness, abdominal pain, skin rash, fevers, chills, night sweats, weight loss, swollen lymph nodes, body aches, joint swelling, muscle aches, chest pain, shortness of breath, mood changes.   Objective  Blood pressure 114/70, pulse 94, weight 116 lb (52.6 kg), SpO2 96 %.  General: No apparent distress alert and oriented x3 mood and affect normal, dressed appropriately.  HEENT: Pupils equal, extraocular movements intact  Respiratory: Patient's speak in full sentences and does not appear short of breath  Cardiovascular: No lower extremity edema, non tender, no erythema  Skin: Warm dry intact with no  signs of infection or rash on extremities or on axial skeleton.  Abdomen: Soft nontender  Neuro: Cranial nerves II through XII are intact, neurovascularly intact in all extremities with 2+ DTRs and 2+ pulses.  Lymph: No lymphadenopathy of posterior or anterior cervical chain or axillae bilaterally.  Gait Severely antalgic gait.  MSK:  Non tender with full range of motion and good stability and symmetric strength and tone of shoulders, elbows, wrist, , knee and ankles bilaterally.  Beighton score of 7  Significant laxity of multiple joints. Patient's  low back pain shows tenderness over the left and right SI joint and does have a positive Faber test. Less than previous exam Negative straight leg test Patient does have tenderness to palpation over the left ischial bursa area but also improved  Osteopathic findings T3 extended rotated and side bent right L2 flexed rotated and side bent left Sacrum right on right Pelvis does have a anterior left ileum    Impression and Recommendations:     This case required medical decision making of moderate complexity.      Note: This dictation was prepared with Dragon dictation along with smaller phrase technology. Any transcriptional errors that result from this process are unintentional.

## 2015-08-23 ENCOUNTER — Ambulatory Visit (INDEPENDENT_AMBULATORY_CARE_PROVIDER_SITE_OTHER): Payer: BLUE CROSS/BLUE SHIELD | Admitting: Family Medicine

## 2015-08-23 ENCOUNTER — Encounter: Payer: Self-pay | Admitting: Family Medicine

## 2015-08-23 VITALS — BP 114/70 | HR 94 | Wt 116.0 lb

## 2015-08-23 DIAGNOSIS — M999 Biomechanical lesion, unspecified: Secondary | ICD-10-CM

## 2015-08-23 DIAGNOSIS — M4698 Unspecified inflammatory spondylopathy, sacral and sacrococcygeal region: Secondary | ICD-10-CM | POA: Diagnosis not present

## 2015-08-23 DIAGNOSIS — M9903 Segmental and somatic dysfunction of lumbar region: Secondary | ICD-10-CM | POA: Diagnosis not present

## 2015-08-23 DIAGNOSIS — Q796 Ehlers-Danlos syndrome: Secondary | ICD-10-CM | POA: Diagnosis not present

## 2015-08-23 DIAGNOSIS — M357 Hypermobility syndrome: Secondary | ICD-10-CM

## 2015-08-23 DIAGNOSIS — M47818 Spondylosis without myelopathy or radiculopathy, sacral and sacrococcygeal region: Secondary | ICD-10-CM

## 2015-08-23 DIAGNOSIS — M9902 Segmental and somatic dysfunction of thoracic region: Secondary | ICD-10-CM | POA: Diagnosis not present

## 2015-08-23 DIAGNOSIS — M9904 Segmental and somatic dysfunction of sacral region: Secondary | ICD-10-CM

## 2015-08-23 NOTE — Assessment & Plan Note (Addendum)
Patient's imaging is proven that she December 30 changes. Patient has had good response with the manipulation. We discussed that if any worsening symptoms injection could be beneficial. We also discussed advance imaging. Patient is at this moment condom noncompliant on the referral to rheumatology. We'll continue to see patient every 4-6 weeks for further evaluation and treatment. Encourage patient though if worsening symptoms then we will need to have more aggressive therapy. And once again encouraged referral to rheumatologist

## 2015-08-23 NOTE — Patient Instructions (Signed)
Good to see you.  Ice 20 minutes 2 times daily. Usually after activity and before bed. You are doing great  Keep active See me again every 4-6 weeks Consider rheumatology

## 2015-08-23 NOTE — Assessment & Plan Note (Signed)
Patient does have a positive ANA with an extensive titer. Patient referred but has not followed up yet.

## 2015-08-23 NOTE — Assessment & Plan Note (Signed)
Decision today to treat with OMT was based on Physical Exam  After verbal consent patient was treated with HVLA, ME techniques in thoracic, lumbar and sacral areas  Patient tolerated the procedure well with improvement in symptoms  Patient given exercises, stretches and lifestyle modifications  See medications in patient instructions if given  Patient will follow up in 4-6 weeks          

## 2015-09-06 ENCOUNTER — Ambulatory Visit: Payer: BLUE CROSS/BLUE SHIELD | Admitting: Family Medicine

## 2015-09-06 ENCOUNTER — Telehealth: Payer: Self-pay | Admitting: Internal Medicine

## 2015-09-06 NOTE — Telephone Encounter (Signed)
Can she come in tomorrow?  

## 2015-09-06 NOTE — Telephone Encounter (Signed)
Spoke to the pt.  She has tiny red bumps (desribed as blister like), redness, does have some pus.  Has some around her nose and chin.  No fever or other symptoms.  Pt requesting doxy.  She stated doxy always clears this up.  Please advise.  Pt notified she may need to come in and is ok with that.

## 2015-09-06 NOTE — Telephone Encounter (Signed)
Left a message for a return call.

## 2015-09-06 NOTE — Telephone Encounter (Signed)
Pt states he has ongoing issue of  Peri oral dermatitis. Pt request refill  doxycycline (VIBRAMYCIN) 100 MG capsule  [facility] [address]

## 2015-09-07 NOTE — Telephone Encounter (Signed)
Spoke to the pt.  [facility].  Pt stated she will get into work and check her schedule.  Will then call back for an appointment.

## 2015-09-20 ENCOUNTER — Encounter: Payer: Self-pay | Admitting: Family Medicine

## 2015-09-20 ENCOUNTER — Ambulatory Visit (INDEPENDENT_AMBULATORY_CARE_PROVIDER_SITE_OTHER): Payer: BLUE CROSS/BLUE SHIELD | Admitting: Family Medicine

## 2015-09-20 VITALS — BP 114/78 | HR 85 | Wt 116.0 lb

## 2015-09-20 DIAGNOSIS — M999 Biomechanical lesion, unspecified: Secondary | ICD-10-CM

## 2015-09-20 DIAGNOSIS — M533 Sacrococcygeal disorders, not elsewhere classified: Secondary | ICD-10-CM

## 2015-09-20 DIAGNOSIS — M9904 Segmental and somatic dysfunction of sacral region: Secondary | ICD-10-CM | POA: Diagnosis not present

## 2015-09-20 DIAGNOSIS — M9903 Segmental and somatic dysfunction of lumbar region: Secondary | ICD-10-CM | POA: Diagnosis not present

## 2015-09-20 DIAGNOSIS — M9902 Segmental and somatic dysfunction of thoracic region: Secondary | ICD-10-CM | POA: Diagnosis not present

## 2015-09-20 MED ORDER — VITAMIN D (ERGOCALCIFEROL) 1.25 MG (50000 UNIT) PO CAPS: 50000.0000 [IU] | ORAL_CAPSULE | ORAL | 0 refills | Status: DC

## 2015-09-20 NOTE — Patient Instructions (Addendum)
gd to see you  Keep it up  Watch the hiking and stretch after See me again 4 weeks.

## 2015-09-20 NOTE — Progress Notes (Signed)
[name] [address] [place], [place] [address] Phone: [phone] Subjective:    I'm seeing this patient by the request  of:  Lorretta Harp, MD   CC: low back pain f/u  ION:[id number]  Cleo Santucci is a 50 y.o. female coming in with complaint of low back pain. Patient is a 50 year old female who is seen multiple different providers for this problem previously. Patient has been diagnosed with such things as degenerative disc disease at L5-S1 as well as a labral tear that had had surgery in 2013 of her left hip.patient has had numerous epidural injections in the back with no significant improvement. We attempted osteopathic manipulation and home exercises focusing on the sacroiliac joint.  Patient did have significant improvement with her for osteopathic manipulation at last exam.Patient states overall she seems to be making some progress. Patient denies any numbness or tingling. States that overall seems to be making significant progress. An states that she feels that pain just started coming back now. Did have days where she did not even notice the discomfort.     Patient did have labs and does have a positive ANA with a 1-640 titer. Has not seen a rheumatologist. Has been referred. 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 Last x-rays were in 2005. Repeat x-rays 07/16/2015 showed facet degenerative changes but otherwise fairly unremarkable.     Past Medical History:  Diagnosis Date  . Anxiety   . Depression   . Environmental allergies   . GERD (gastroesophageal reflux disease)   . Headache(784.0)   . Recurrent sinusitis    ent and pulm eval in past.   Past Surgical History:  Procedure Laterality Date  . HIP ARTHROSCOPY W/ LABRAL DEBRIDEMENT    . left hip surgery     [facility]   Social History   Social History  . Marital status: Single    Spouse name: N/A  . Number of children: N/A  . Years of education: N/A   Social History Main Topics  . Smoking status: Never Smoker  . Smokeless tobacco: None  . Alcohol use Yes     Comment: Occasional use  . Drug use: No  . Sexual activity: Yes    Birth control/ protection: Pill   Other Topics Concern  . 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 Augmentin [Amoxicillin-Pot Clavulanate] Nausea Only  . Celecoxib     REACTION: unspecified  . Sulfonamide Derivatives     REACTION: hives, throat swelling  . Monistat [Miconazole] Other (See Comments)    Sensitive to topical vaginal  Azoles No side effects with oral Diflucan   Family History  Problem Relation Age of Onset  . Heart disease      fhx    Past medical history, social, surgical and family history all reviewed in electronic medical record.  No pertanent information unless stated regarding to the chief complaint.   Review of Systems: No headache, visual changes, nausea, vomiting, diarrhea, constipation, dizziness, abdominal pain, skin rash, fevers, chills, night sweats, weight loss, swollen lymph nodes, body aches, joint swelling, muscle aches, chest pain, shortness of breath, mood changes.   Objective  Blood pressure 114/78, pulse 85, weight 116 lb (52.6 kg), SpO2 97 %.  General: No apparent distress  alert and oriented x3 mood and affect normal, dressed appropriately.  HEENT: Pupils equal, extraocular movements intact  Respiratory: Patient's speak in full sentences and does not appear short of breath  Cardiovascular: No lower extremity edema, non tender, no erythema  Skin: Warm dry intact with no signs of infection or rash on extremities or on axial skeleton.  Abdomen: Soft nontender  Neuro: Cranial nerves II through XII are intact, neurovascularly intact in all extremities with 2+ DTRs and 2+ pulses.  Lymph: No lymphadenopathy of posterior or  anterior cervical chain or axillae bilaterally.  Gait Severely antalgic gait.  MSK:  Non tender with full range of motion and good stability and symmetric strength and tone of shoulders, elbows, wrist, , knee and ankles bilaterally.  Beighton score of 7  Significant laxity of multiple joints. Mild tenderness still over the right sacroiliac joint. Positive Faber on the right side as well. Less tenderness over the ischial bursa  Osteopathic findings T3 extended rotated and side bent right L2 flexed rotated and side bent left Sacrum right on right Pelvis does have a anterior left ileum    Impression and Recommendations:     This case required medical decision making of moderate complexity.      Note: This dictation was prepared with Dragon dictation along with smaller phrase technology. Any transcriptional errors that result from this process are unintentional.

## 2015-09-20 NOTE — Assessment & Plan Note (Signed)
Decision today to treat with OMT was based on Physical Exam  After verbal consent patient was treated with HVLA, ME techniques in thoracic, lumbar and sacral areas  Patient tolerated the procedure well with improvement in symptoms  Patient given exercises, stretches and lifestyle modifications  See medications in patient instructions if given  Patient will follow up in 4-6 weeks          

## 2015-09-20 NOTE — Assessment & Plan Note (Signed)
Patient seems to be doing relatively well. Did have difficulty with her hip today. We did make some suggestions. Patient is going to check make sure she is not doing any repetitive activity that could be contributing to that. We did discuss hip abductor strengthening. Patient and will come back and see me again in 4-6 weeks.

## 2015-10-05 DIAGNOSIS — M531 Cervicobrachial syndrome: Secondary | ICD-10-CM | POA: Diagnosis not present

## 2015-10-05 DIAGNOSIS — M9902 Segmental and somatic dysfunction of thoracic region: Secondary | ICD-10-CM | POA: Diagnosis not present

## 2015-10-05 DIAGNOSIS — M9901 Segmental and somatic dysfunction of cervical region: Secondary | ICD-10-CM | POA: Diagnosis not present

## 2015-10-05 DIAGNOSIS — M7700 Medial epicondylitis, unspecified elbow: Secondary | ICD-10-CM | POA: Diagnosis not present

## 2015-10-17 NOTE — Progress Notes (Signed)
Terri [name] D.O. [facility] [address] [place], [place] [address] Phone: [phone] Subjective:    I'm seeing this patient by the request  of:  Lorretta HarpPANOSH,WANDA KOTVAN, MD   CC: low back pain f/u  BJY:NWGNFAOZHYHPI:Subjective  Terri NewtonJulie [name] is a 50 y.o. female coming in with complaint of low back pain. Patient is a 50 year old female who is seen multiple different providers for this problem previously. Patient has been diagnosed with such things as degenerative disc disease at L5-S1 as well as a labral tear that had had surgery in 2013 of her left hip.patient has had numerous epidural injections in the back with no significant improvement. We attempted osteopathic manipulation and home exercises focusing on the sacroiliac joint.   Patient had been doing markedly well with conservative therapy. Patient states overall she had been doing very well. Having some mild increasing tightness of the lower back. Patient has not been wearing the brace she was wearing regularly for some time and maybe that exacerbated it.     Patient did have labs and does have a positive ANA with a 1-640 titer. Has not seen a rheumatologist. Has been referred. 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 Last x-rays were in 2005. Repeat x-rays 07/16/2015 showed facet degenerative changes but otherwise fairly unremarkable.     Past Medical History:  Diagnosis Date  . Anxiety   . Depression   . Environmental allergies   . GERD (gastroesophageal reflux disease)   . Headache(784.0)   . Recurrent sinusitis    ent and pulm eval in past.   Past Surgical History:  Procedure Laterality Date  . HIP ARTHROSCOPY W/ LABRAL DEBRIDEMENT    . left hip surgery     [facility]   Social History   Social History  . Marital status: Single    Spouse name: N/A  . Number of children: N/A  . Years of education: N/A   Social History  Main Topics  . Smoking status: Never Smoker  . Smokeless tobacco: None  . Alcohol use Yes     Comment: Occasional use  . Drug use: No  . Sexual activity: Yes    Birth control/ protection: Pill   Other Topics Concern  . 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 Augmentin [Amoxicillin-Pot Clavulanate] Nausea Only  . Celecoxib     REACTION: unspecified  . Sulfonamide Derivatives     REACTION: hives, throat swelling  . Monistat [Miconazole] Other (See Comments)    Sensitive to topical vaginal  Azoles No side effects with oral Diflucan   Family History  Problem Relation Age of Onset  . Heart disease      fhx    Past medical history, social, surgical and family history all reviewed in electronic medical record.  No pertanent information unless stated regarding to the chief complaint.   Review of Systems: No headache, visual changes, nausea, vomiting, diarrhea, constipation, dizziness, abdominal pain, skin rash, fevers, chills, night sweats, weight loss, swollen lymph nodes, body aches, joint swelling, muscle aches, chest pain, shortness of breath, mood changes.   Objective  Pulse 84, weight 117 lb (53.1 kg), SpO2 96 %.  General: No apparent distress alert and oriented x3 mood and affect normal, dressed appropriately.  HEENT: Pupils equal, extraocular  movements intact  Respiratory: Patient's speak in full sentences and does not appear short of breath  Cardiovascular: No lower extremity edema, non tender, no erythema  Skin: Warm dry intact with no signs of infection or rash on extremities or on axial skeleton.  Abdomen: Soft nontender  Neuro: Cranial nerves II through XII are intact, neurovascularly intact in all extremities with 2+ DTRs and 2+ pulses.  Lymph: No lymphadenopathy of posterior or anterior cervical chain or axillae bilaterally.  Gait improved gait,  MSK:  Non tender with full range of motion and  good stability and symmetric strength and tone of shoulders, elbows, wrist, , knee and ankles bilaterally.  Beighton score of 7  Significant laxity of multiple joints. Increasing tightness of the right sacroiliac joint some mild crepitus with range of motion as well. Positive Faber test today. Back range of motion seems to be full.  Osteopathic findings T3 extended rotated and side bent right L2 flexed rotated and side bent left Sacrum right on right    Impression and Recommendations:     This case required medical decision making of moderate complexity.      Note: This dictation was prepared with Dragon dictation along with smaller phrase technology. Any transcriptional errors that result from this process are unintentional.

## 2015-10-17 NOTE — Assessment & Plan Note (Signed)
Decision today to treat with OMT was based on Physical Exam  After verbal consent patient was treated with HVLA, ME techniques in thoracic, lumbar and sacral  areas  Patient tolerated the procedure well with improvement in symptoms  Patient given exercises, stretches and lifestyle modifications  See medications in patient instructions if given  Patient will follow up in 4-8 weeks  

## 2015-10-17 NOTE — Assessment & Plan Note (Signed)
Patient for the last couple visits has made some progression. Patient seems to be doing fairly well overall. Patient does have arthritic changes and a positive ANA but has not followed up with rheumatology. Patient feels that she is doing relatively well. We'll continue conservative therapy and follow-up again within 8 weeks.

## 2015-10-18 ENCOUNTER — Encounter: Payer: Self-pay | Admitting: Family Medicine

## 2015-10-18 ENCOUNTER — Ambulatory Visit (INDEPENDENT_AMBULATORY_CARE_PROVIDER_SITE_OTHER): Payer: BLUE CROSS/BLUE SHIELD | Admitting: Family Medicine

## 2015-10-18 VITALS — HR 84 | Wt 117.0 lb

## 2015-10-18 DIAGNOSIS — M9902 Segmental and somatic dysfunction of thoracic region: Secondary | ICD-10-CM | POA: Diagnosis not present

## 2015-10-18 DIAGNOSIS — M9904 Segmental and somatic dysfunction of sacral region: Secondary | ICD-10-CM

## 2015-10-18 DIAGNOSIS — M9903 Segmental and somatic dysfunction of lumbar region: Secondary | ICD-10-CM | POA: Diagnosis not present

## 2015-10-18 DIAGNOSIS — M999 Biomechanical lesion, unspecified: Secondary | ICD-10-CM

## 2015-10-18 DIAGNOSIS — M4698 Unspecified inflammatory spondylopathy, sacral and sacrococcygeal region: Secondary | ICD-10-CM

## 2015-10-18 DIAGNOSIS — M47818 Spondylosis without myelopathy or radiculopathy, sacral and sacrococcygeal region: Secondary | ICD-10-CM

## 2015-10-18 DIAGNOSIS — M533 Sacrococcygeal disorders, not elsewhere classified: Secondary | ICD-10-CM

## 2015-10-18 NOTE — Assessment & Plan Note (Signed)
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 Patient encouraged to continue on the core strengthening. Patient is still able to do daily activities and is not having any of the chronic pain that is debilitating at this time. Follow-up again in 4 weeks.

## 2015-10-18 NOTE — Patient Instructions (Addendum)
Good to see you  You are doing great though.  Ice when you need it.  4 weeks.

## 2015-10-20 DIAGNOSIS — M9901 Segmental and somatic dysfunction of cervical region: Secondary | ICD-10-CM | POA: Diagnosis not present

## 2015-10-20 DIAGNOSIS — M7700 Medial epicondylitis, unspecified elbow: Secondary | ICD-10-CM | POA: Diagnosis not present

## 2015-10-20 DIAGNOSIS — M9902 Segmental and somatic dysfunction of thoracic region: Secondary | ICD-10-CM | POA: Diagnosis not present

## 2015-10-20 DIAGNOSIS — M531 Cervicobrachial syndrome: Secondary | ICD-10-CM | POA: Diagnosis not present

## 2015-11-10 DIAGNOSIS — M7700 Medial epicondylitis, unspecified elbow: Secondary | ICD-10-CM | POA: Diagnosis not present

## 2015-11-10 DIAGNOSIS — M9901 Segmental and somatic dysfunction of cervical region: Secondary | ICD-10-CM | POA: Diagnosis not present

## 2015-11-10 DIAGNOSIS — M531 Cervicobrachial syndrome: Secondary | ICD-10-CM | POA: Diagnosis not present

## 2015-11-10 DIAGNOSIS — M9902 Segmental and somatic dysfunction of thoracic region: Secondary | ICD-10-CM | POA: Diagnosis not present

## 2015-11-14 NOTE — Progress Notes (Signed)
Terri [name] [address] [place], [place] [address] Phone: [phone] Subjective:    CC: low back pain f/u  BJY:[id number]  Terri [name] is a 50 y.o. female coming in with complaint of low back pain. Patient is a 50 year old female who is seen multiple different providers for this problem previously. Patient has been diagnosed with such things as degenerative disc disease at L5-S1 as well as a labral tear that had had surgery in 2013 of her left hip.patient has had numerous epidural injections in the back with no significant improvement. We attempted osteopathic manipulation and home exercises focusing on the sacroiliac joint.   Patient though has had more upper left sided back pain. Has been known to have slipped rib syndrome. Started having increasing pain again about 3 weeks ago. States that it even kept her out of work one time. Mild radiation down the arm. Has had history of a cervical neck herniated disc long ago that did respond well to conservative therapy.     Patient did have labs and does have a positive ANA with a 1-640 titer. Has not seen a rheumatologist. Has been referred. 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 Last x-rays were in 2005. Repeat x-rays 07/16/2015 showed facet degenerative changes but otherwise fairly unremarkable.     Past Medical History:  Diagnosis Date  . Anxiety   . Depression   . Environmental allergies   . GERD (gastroesophageal reflux disease)   . Headache(784.0)   . Recurrent sinusitis    ent and pulm eval in past.   Past Surgical History:  Procedure Laterality Date  . HIP ARTHROSCOPY W/ LABRAL DEBRIDEMENT    . left hip surgery     [facility]   Social History   Social History  . Marital status: Single    Spouse name: N/A  . Number of children: N/A  . Years of education: N/A   Social History Main Topics  .  Smoking status: Never Smoker  . Smokeless tobacco: None  . Alcohol use Yes     Comment: Occasional use  . Drug use: No  . Sexual activity: Yes    Birth control/ protection: Pill   Other Topics Concern  . 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 Augmentin [Amoxicillin-Pot Clavulanate] Nausea Only  . Celecoxib     REACTION: unspecified  . Sulfonamide Derivatives     REACTION: hives, throat swelling  . Monistat [Miconazole] Other (See Comments)    Sensitive to topical vaginal  Azoles No side effects with oral Diflucan   Family History  Problem Relation Age of Onset  . Heart disease      fhx    Past medical history, social, surgical and family history all reviewed in electronic medical record.  No pertanent information unless stated regarding to the chief complaint.   Review of Systems: No headache, visual changes, nausea, vomiting, diarrhea, constipation, dizziness, abdominal pain, skin rash, fevers, chills, night sweats, weight loss, swollen lymph nodes, body aches, joint swelling, muscle aches, chest pain, shortness of breath, mood changes.   Objective  Blood pressure 122/82, pulse 78, weight 118 lb (53.5 kg), SpO2 97 %.  General: No apparent distress alert and oriented x3 mood and affect normal, dressed appropriately.  HEENT:  Pupils equal, extraocular movements intact  Respiratory: Patient's speak in full sentences and does not appear short of breath  Cardiovascular: No lower extremity edema, non tender, no erythema  Skin: Warm dry intact with no signs of infection or rash on extremities or on axial skeleton.  Abdomen: Soft nontender  Neuro: Cranial nerves II through XII are intact, neurovascularly intact in all extremities with 2+ DTRs and 2+ pulses.  Lymph: No lymphadenopathy of posterior or anterior cervical chain or axillae bilaterally.  Gait improved gait,  MSK:  Non tender with full range of  motion and good stability and symmetric strength and tone of shoulders, elbows, wrist, , knee and ankles bilaterally.  Beighton score of 7  Significant laxity of multiple joints. Increasing tightness of the right sacroiliac joint some mild crepitus with range of motion as well. Negative Pearlean BrownieFaber test which is an improvement. Full range of motion of the lower extremity Neck: Inspection unremarkable. No palpable stepoffs. Negative Spurling's maneuver. Full neck range of motion Grip strength and sensation normal in bilateral hands Strength good C4 to T1 distribution No sensory change to C4 to T1 Negative Hoffman sign bilaterally Reflexes normal  Osteopathic findings T3 extended rotated and side bent left with inhaled third rib L2 flexed rotated and side bent left Sacrum right on right    Impression and Recommendations:     This case required medical decision making of moderate complexity.      Note: This dictation was prepared with Dragon dictation along with smaller phrase technology. Any transcriptional errors that result from this process are unintentional.

## 2015-11-15 ENCOUNTER — Ambulatory Visit (INDEPENDENT_AMBULATORY_CARE_PROVIDER_SITE_OTHER): Payer: BLUE CROSS/BLUE SHIELD | Admitting: Family Medicine

## 2015-11-15 ENCOUNTER — Encounter: Payer: Self-pay | Admitting: Family Medicine

## 2015-11-15 VITALS — BP 122/82 | HR 78 | Wt 118.0 lb

## 2015-11-15 DIAGNOSIS — M940 Chondrocostal junction syndrome [Tietze]: Secondary | ICD-10-CM | POA: Diagnosis not present

## 2015-11-15 DIAGNOSIS — M999 Biomechanical lesion, unspecified: Secondary | ICD-10-CM | POA: Diagnosis not present

## 2015-11-15 NOTE — Assessment & Plan Note (Signed)
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 Discussed with patient at great length. 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 We discussed which activities to avoid. Patient will come back and see me again in 4-6 weeks.

## 2015-11-15 NOTE — Assessment & Plan Note (Signed)
Decision today to treat with OMT was based on Physical Exam  After verbal consent patient was treated with HVLA, ME techniques in thoracic rib, lumbar and sacral areas  Patient tolerated the procedure well with improvement in symptoms  Patient given exercises, stretches and lifestyle modifications  See medications in patient instructions if given  Patient will follow up in 4-6 weeks

## 2015-11-15 NOTE — Patient Instructions (Signed)
Good to see you  Ice is your friend Watch reaching out too far for next 48 hours.  Continue everything else Traction 3-4 times a week.  See me again in 4 weeks.

## 2015-11-22 DIAGNOSIS — M531 Cervicobrachial syndrome: Secondary | ICD-10-CM | POA: Diagnosis not present

## 2015-11-22 DIAGNOSIS — M7700 Medial epicondylitis, unspecified elbow: Secondary | ICD-10-CM | POA: Diagnosis not present

## 2015-11-22 DIAGNOSIS — M9902 Segmental and somatic dysfunction of thoracic region: Secondary | ICD-10-CM | POA: Diagnosis not present

## 2015-11-22 DIAGNOSIS — M9901 Segmental and somatic dysfunction of cervical region: Secondary | ICD-10-CM | POA: Diagnosis not present

## 2015-11-29 ENCOUNTER — Ambulatory Visit: Payer: BLUE CROSS/BLUE SHIELD | Admitting: Internal Medicine

## 2015-11-30 NOTE — Progress Notes (Signed)
Pre visit review using our clinic review tool, if applicable. No additional management support is needed unless otherwise documented below in the visit note.  Chief Complaint  Patient presents with  . Sinusitis    X1 month.  Cough is productive/green.  . Cough  . Sinus Pressure  . Fever  . Chills  . Night Sweats    HPI: Charlotta NewtonJulie Nooney 50 y.o. comes in today with 1 month symptoms. She has had a history of recurrent sinusitis but has been using saline and Flonase with good success. Last time treated with antibiotic was May 2016. However she had which was almost like a cold about a month ago with ongoing continued face pain green postnasal drainage and bad taste in her mouth isn't getting better with a mild sore throat. Perhaps low-grade fevers at night minor cough.  ROS: See pertinent positives and negatives per HPI. No cp sob  hmptysis has sorea area left nose  Past Medical History:  Diagnosis Date  . Anxiety   . Depression   . Environmental allergies   . GERD (gastroesophageal reflux disease)   . Headache(784.0)   . Recurrent sinusitis    ent and pulm eval in past.    Family History  Problem Relation Age of Onset  . Heart disease      fhx    Social History   Social History  . Marital status: Single    Spouse name: N/A  . Number of children: N/A  . Years of education: N/A   Social History Main Topics  . Smoking status: Never Smoker  . Smokeless tobacco: None  . Alcohol use Yes     Comment: Occasional use  . Drug use: No  . Sexual activity: Yes    Birth control/ protection: Pill   Other Topics Concern  . 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 ALPRAZolam (XANAX) 0.25 MG tablet Take 1 tablet (0.25 mg total) by mouth 3 (three) times daily as needed for anxiety (panic). 20 tablet 0  . B Complex Vitamins (VITAMIN-B COMPLEX PO) Take by mouth.    [name].  buPROPion (WELLBUTRIN XL) 150 MG 24 hr tablet Take 2 tablets (300 mg total) by mouth daily. 60 tablet 11  . fish oil-omega-3 fatty acids 1000 MG capsule Take 1 g by mouth daily.      . fluticasone (FLONASE) 50 MCG/ACT nasal spray USE 2 SPRAYS IN EACH NOSTRIL ONCE DAILY 48 g 1  . folic acid (FOLVITE) 1 MG tablet Take 1 mg by mouth daily.    [name]. LYSINE PO Take 1,000 mg by mouth daily. for fever blisters.    . norethindrone-ethinyl estradiol (JUNEL FE 1/20) 1-20 MG-MCG per tablet Take 1 tablet by mouth daily.      . SUMAtriptan (IMITREX) 100 MG tablet Take 1 at onset of  Migraine headache   and can repeat in 2 hours . 9 tablet 1  . vitamin C (ASCORBIC ACID) 500 MG tablet Take 500 mg by mouth daily as needed.     . Vitamin D, Ergocalciferol, (DRISDOL) 50000 units CAPS capsule Take 1 capsule (50,000 Units total) by mouth every 7 (seven) days. 12 capsule 0  . montelukast (SINGULAIR) 10 MG tablet Take 1 tablet (10 mg total) by mouth at bedtime. (Patient not taking: Reported on 12/01/2015) 30 tablet 3  .  valACYclovir (VALTREX) 1000 MG tablet TAKE 1 TABLET (1,000 MG TOTAL) BY MOUTH 2 (TWO) TIMES DAILY. (Patient not taking: Reported on 12/01/2015) 30 tablet 1  . diazepam (VALIUM) 5 MG tablet One tab by mouth, 2 hours before procedure. 2 tablet 0   No facility-administered medications prior to visit.      EXAM:  BP 118/66 (BP Location: Right Arm, Patient Position: Sitting)   Temp 97.7 F (36.5 C) (Core (Comment))   Wt 121 lb (54.9 kg)   BMI 21.78 kg/m   Body mass index is 21.78 kg/m.  GENERAL: vitals reviewed and listed above, alert, oriented, appears well hydrated and in no acute distress HEENT: atraumatic, conjunctiva  clear, no obvious abnormalities on inspection of external nose and ears t m clear  Nares  Congested red stop left nostril bilateral max frontal tenderness  OP : no lesion edema or exudate  NECK: no obvious masses on inspection palpation shoddy ac nodes MS: moves all extremities  without noticeable focal  abnormality PSYCH: pleasant and cooperative, no obvious depression or anxiety  ASSESSMENT AND PLAN:  Discussed the following assessment and plan:  Recurrent sinusitis History of recurrent sinusitis. Is actually been doing fairly well does respond to antibiotics occasionally has side effects of prednisone. Doxycycline or 10 days this time with continued sinus hygiene. Expectant management. Caution to take Doxy with enough fluid water. To avoid GI side effects. -Patient advised to return or notify health care team  if symptoms worsen ,persist or new concerns arise.  Patient Instructions  Continue sinus  Washes as you are doing  And add antibiotic  /.  Fu when needed.     Neta MendsWanda K. Panosh M.D.

## 2015-12-01 ENCOUNTER — Ambulatory Visit (INDEPENDENT_AMBULATORY_CARE_PROVIDER_SITE_OTHER): Payer: BLUE CROSS/BLUE SHIELD | Admitting: Internal Medicine

## 2015-12-01 ENCOUNTER — Encounter: Payer: Self-pay | Admitting: Internal Medicine

## 2015-12-01 VITALS — BP 118/66 | Temp 97.7°F | Wt 121.0 lb

## 2015-12-01 DIAGNOSIS — J329 Chronic sinusitis, unspecified: Secondary | ICD-10-CM | POA: Diagnosis not present

## 2015-12-01 MED ORDER — DOXYCYCLINE HYCLATE 100 MG PO TABS: 100.0000 mg | ORAL_TABLET | Freq: Two times a day (BID) | ORAL | 0 refills | Status: DC

## 2015-12-01 NOTE — Patient Instructions (Addendum)
Continue sinus  Washes as you are doing  And add antibiotic  /.  Fu when needed.

## 2015-12-13 NOTE — Progress Notes (Deleted)
Tawana ScaleZach [name] D.O. [facility] [address] [place], [place] [address] Phone: [phone] Subjective:    CC: low back pain f/u  OZD:[id number]:Subjective  Terri NewtonJulie Nordquist is a 50 y.o. female coming in with complaint of low back pain. Patient is a 50 year old female who is seen multiple different providers for this problem previously. Patient has been diagnosed with such things as degenerative disc disease at L5-S1 as well as a labral tear that had had surgery in 2013 of her left hip.patient has had numerous epidural injections in the back with no significant improvement. We attempted osteopathic manipulation and home exercises focusing on the sacroiliac joint.   Patient states    Patient did have labs and does have a positive ANA with a 1-640 titer. Has not seen a rheumatologist. Has been referred. 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 Last x-rays were in 2005. Repeat x-rays 07/16/2015 showed facet degenerative changes but otherwise fairly unremarkable.     Past Medical History:  Diagnosis Date  . Anxiety   . Depression   . Environmental allergies   . GERD (gastroesophageal reflux disease)   . Headache(784.0)   . Recurrent sinusitis    ent and pulm eval in past.   Past Surgical History:  Procedure Laterality Date  . HIP ARTHROSCOPY W/ LABRAL DEBRIDEMENT    . left hip surgery     [facility]   Social History   Social History  . Marital status: Single    Spouse name: N/A  . Number of children: N/A  . Years of education: N/A   Social History Main Topics  . Smoking status: Never Smoker  . Smokeless tobacco: Not on file  . Alcohol use Yes     Comment: Occasional use  . Drug use: No  . Sexual activity: Yes    Birth control/ protection: Pill   Other Topics Concern  . 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 Augmentin [Amoxicillin-Pot Clavulanate] Nausea Only  . Celecoxib     REACTION: unspecified  . Sulfonamide Derivatives     REACTION: hives, throat swelling  . Monistat [Miconazole] Other (See Comments)    Sensitive to topical vaginal  Azoles No side effects with oral Diflucan   Family History  Problem Relation Age of Onset  . Heart disease      fhx    Past medical history, social, surgical and family history all reviewed in electronic medical record.  No pertanent information unless stated regarding to the chief complaint.   Review of Systems: No headache, visual changes, nausea, vomiting, diarrhea, constipation, dizziness, abdominal pain, skin rash, fevers, chills, night sweats, weight loss, swollen lymph nodes, body aches, joint swelling, muscle aches, chest pain, shortness of breath, mood changes.   Objective  There were no vitals taken for this visit.  Systems examined below as of 12/13/15 General: NAD A&O x3 mood, affect normal  HEENT: Pupils equal, extraocular movements intact no nystagmus Respiratory: not short of breath at rest or with speaking Cardiovascular: No lower extremity edema, non tender Skin: Warm dry intact with no signs of infection or rash on extremities or on axial skeleton. Abdomen: Soft nontender, no masses Neuro: Cranial nerves  intact, neurovascularly intact in all extremities with 2+ DTRs and 2+  pulses. Lymph: No lymphadenopathy appreciated today  Gait normal with good balance and coordination.  MSK: Non tender with full range of motion and good stability and symmetric strength and tone of shoulders, elbows, wrist,  knee hips and ankles bilaterally.  Beighton score of 7  Significant laxity of multiple joints. Increasing tightness of the right sacroiliac joint some mild crepitus with range of motion as well. Negative Pearlean BrownieFaber test which is an improvement. Full range of motion of the lower extremity Neck: Inspection unremarkable. No  palpable stepoffs. Negative Spurling's maneuver. Full neck range of motion Grip strength and sensation normal in bilateral hands Strength good C4 to T1 distribution No sensory change to C4 to T1 Negative Hoffman sign bilaterally Reflexes normal  Osteopathic findings T3 extended rotated and side bent left with inhaled third rib L2 flexed rotated and side bent left Sacrum right on right    Impression and Recommendations:     This case required medical decision making of moderate complexity.      Note: This dictation was prepared with Dragon dictation along with smaller phrase technology. Any transcriptional errors that result from this process are unintentional.

## 2015-12-14 ENCOUNTER — Other Ambulatory Visit: Payer: Self-pay | Admitting: Family Medicine

## 2015-12-14 ENCOUNTER — Ambulatory Visit: Payer: BLUE CROSS/BLUE SHIELD | Admitting: Family Medicine

## 2015-12-14 NOTE — Telephone Encounter (Signed)
Refill done.  

## 2016-01-25 NOTE — Progress Notes (Signed)
Pre visit review using our clinic review tool, if applicable. No additional management support is needed unless otherwise documented below in the visit note.  Chief Complaint  Patient presents with  . Medication Management    Pt sees Fred May (counselor) and he has suggested the pt try medication for ADD.    HPI: Terri [name] 51 y.o. comes in today at the advice of her counselor who she is seen over the last 4-5 years to discuss possibility of treatment for ADHD. Her counselor has brought this up before that some of her situation may be caused by an attentional deficit which includes planning organizational function focusing and getting things done on time. Attention to detail. Avoiding careless errors. She is always been a worrier even when she was a small child and sometimes obsesses about worry about how she can get something done. She has been under treatment for depression and anxiety but has been able to function well. 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 Past history all with control for school for talking some difficulty staying on task getting work done on time but compensated well and was able to graduate college in Architect. Math was her difficult subject. She does better with verbal been reading instructions have to be given to her multiple times in small steps for her to remember how to proceed. ROS: See pertinent positives and negatives per HPI. No cv disease  Her brother diagnosed with ADHD cousins or nieces and nephews ADHD stepsister ADHD her mother probably had this but wasn't diagnosed.  She has done some surveys with the adult ADHD self-report scale with her counselor and always comes out positive on all the areas. Feels that she worries about things that seem to be minor. She would also want to avoid  medication however upon insight into this area she is ready to consider it. Her sister has used Vyvanse with good success.  Past Medical History:  Diagnosis Date  . Anxiety   . Depression   . Environmental allergies   . GERD (gastroesophageal reflux disease)   . Headache(784.0)   . Recurrent sinusitis    ent and pulm eval in past.    Family History  Problem Relation Age of Onset  . Heart disease      fhx    Social History   Social History  . Marital status: Single    Spouse name: N/A  . Number of children: N/A  . Years of education: N/A   Social History Main Topics  . Smoking status: Never Smoker  . Smokeless tobacco: None  . Alcohol use Yes     Comment: Occasional use  . Drug use: No  . Sexual activity: Yes    Birth control/ protection: Pill   Other Topics Concern  . 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 ALPRAZolam (XANAX) 0.25 MG tablet Take 1 tablet (0.25 mg total) by mouth 3 (three) times daily as needed for anxiety (panic). 20 tablet 0  . B Complex Vitamins (VITAMIN-B COMPLEX PO) Take by mouth.    [name] buPROPion (WELLBUTRIN XL) 150 MG 24 hr tablet Take 2 tablets (300  mg total) by mouth daily. 60 tablet 11  . fish oil-omega-3 fatty acids 1000 MG capsule Take 1 g by mouth daily.      . fluticasone (FLONASE) 50 MCG/ACT nasal spray USE 2 SPRAYS IN EACH NOSTRIL ONCE DAILY 48 g 1  . folic acid (FOLVITE) 1 MG tablet Take 1 mg by mouth daily.    . LYSMarland KitchenINE PO Take 1,000 mg by mouth daily. for fever blisters.    . norethindrone-ethinyl estradiol (JUNEL FE 1/20) 1-20 MG-MCG per tablet Take 1 tablet by mouth daily.      . SUMAtriptan (IMITREX) 100 MG tablet Take 1 at onset of  Migraine headache   and can repeat in 2 hours . 9 tablet 1  . valACYclovir (VALTREX) 1000 MG tablet TAKE 1 TABLET (1,000 MG TOTAL) BY MOUTH 2 (TWO) TIMES DAILY. 30 tablet 1  . vitamin C  (ASCORBIC ACID) 500 MG tablet Take 500 mg by mouth daily as needed.     . Vitamin D, Ergocalciferol, (DRISDOL) 50000 units CAPS capsule TAKE 1 CAPSULE (50,000 UNITS TOTAL) BY MOUTH EVERY 7 (SEVEN) DAYS. 12 capsule 0  . doxycycline (VIBRA-TABS) 100 MG tablet Take 1 tablet (100 mg total) by mouth 2 (two) times daily. 20 tablet 0  . montelukast (SINGULAIR) 10 MG tablet Take 1 tablet (10 mg total) by mouth at bedtime. (Patient not taking: Reported on 12/01/2015) 30 tablet 3   No facility-administered medications prior to visit.      EXAM:  BP 118/76 (BP Location: Right Arm, Patient Position: Sitting, Cuff Size: Normal)   Temp 98.2 F (36.8 C) (Oral)   Wt 120 lb 12.8 oz (54.8 kg)   BMI 21.74 kg/m   Body mass index is 21.74 kg/m.  GENERAL: vitals reviewed and listed above, alert, oriented, appears well hydrated and in no acute distress PSYCH: pleasant and cooperative, no obvious depression    Mildly anxious on discussion can tell that she has a lot of worry.  Lab Results  Component Value Date   GLUCOSE 103 (H) 07/12/2015   ALT 20 07/12/2015   AST 20 07/12/2015   NA 137 07/12/2015   K 3.7 07/12/2015   CL 105 07/12/2015   CREATININE 0.99 07/12/2015   BUN 14 07/12/2015   CO2 22 07/12/2015   BP Readings from Last 3 Encounters:  01/26/16 118/76  12/01/15 118/66  11/15/15 122/82   Wt Readings from Last 3 Encounters:  01/26/16 120 lb 12.8 oz (54.8 kg)  12/01/15 121 lb (54.9 kg)  11/15/15 118 lb (53.5 kg)   Adult adhd score A6/6 , part b 9/9   (Sometimes  appts ) ASSESSMENT AND PLAN:  Discussed the following assessment and plan:  Attention deficit hyperactivity disorder (ADHD), unspecified ADHD type probable - see text   Adjustment reaction with anxiety and depression  Medication management Concern about ADD ADHD and medications.  Patient has anxiety and some obsession with anxiety but family history genetics struggles earlier in her life academically and socially  for which  she has compensated are consistent with ADHD. There is a possibility she could've had a LD in reading also by less likely. Discussed possibilities of medications referral for formal more evaluation but I've known this patient for a number of years and she has been in counseling for a number of years and she does fit that profile. Discussed Vyvanse low-dose 20 mg once a day  Risk benefit ...can miss a day follow-up in a month and reassess contact us in the  meantime if needed encouraged to take only in the morning but did not wear a somewhat about the exact time. Not to  interfere with sleep. Discussed risk-benefit of medicine UDS and contract for controlled substances. Patient agrees. Would favor getting a summary opinion from her counselor for me to review and our records. -Patient advised to return or notify health care team  if symptoms worsen ,persist or new concerns arise.   Expectant management. Total visit > 50% spent counseling and coordinating care as indicated in above note and in instructions to patient .     Patient Instructions  You  Do have  A profile consistent with adhd. And genetic  Tendencies also .  Sometimes stimulant medications are helpful but they do have significant side effects. If we decide to go with this he would take medicine every day early enough in the day to avoid interference with sleep Follow-up visit in one month. Please have your counselor therapist sent me a summary note on his observations and recommendations. Sometimes stimulant medicine can make anxiety worse but sometimes it improves it if you're able to organize her thoughts better.   When we use controlled substances we have you sign a contract and do occasional UDS is routine.    [name]. Terrika Zuver M.D.

## 2016-01-26 ENCOUNTER — Ambulatory Visit (INDEPENDENT_AMBULATORY_CARE_PROVIDER_SITE_OTHER): Payer: BLUE CROSS/BLUE SHIELD | Admitting: Internal Medicine

## 2016-01-26 ENCOUNTER — Encounter: Payer: Self-pay | Admitting: Internal Medicine

## 2016-01-26 ENCOUNTER — Telehealth: Payer: Self-pay

## 2016-01-26 ENCOUNTER — Encounter: Payer: Self-pay | Admitting: Family Medicine

## 2016-01-26 VITALS — BP 118/76 | Temp 98.2°F | Wt 120.8 lb

## 2016-01-26 DIAGNOSIS — Z79899 Other long term (current) drug therapy: Secondary | ICD-10-CM | POA: Diagnosis not present

## 2016-01-26 DIAGNOSIS — F909 Attention-deficit hyperactivity disorder, unspecified type: Secondary | ICD-10-CM

## 2016-01-26 DIAGNOSIS — F4323 Adjustment disorder with mixed anxiety and depressed mood: Secondary | ICD-10-CM | POA: Diagnosis not present

## 2016-01-26 MED ORDER — LISDEXAMFETAMINE DIMESYLATE 20 MG PO CAPS: 20.0000 mg | ORAL_CAPSULE | Freq: Every day | ORAL | 0 refills | Status: DC

## 2016-01-26 NOTE — Telephone Encounter (Signed)
Received PA request from [facility] for Vyvanse 20 mg capsules. PA submitted & is pending. Key: [id number]

## 2016-01-26 NOTE — Patient Instructions (Addendum)
You  Do have  A profile consistent with adhd. And genetic  Tendencies also .  Sometimes stimulant medications are helpful but they do have significant side effects. If we decide to go with this he would take medicine every day early enough in the day to avoid interference with sleep Follow-up visit in one month. Please have your counselor therapist sent me a summary note on his observations and recommendations. Sometimes stimulant medicine can make anxiety worse but sometimes it improves it if you're able to organize her thoughts better.   When we use controlled substances we have you sign a contract and do occasional UDS is routine.

## 2016-01-27 ENCOUNTER — Encounter: Payer: Self-pay | Admitting: Internal Medicine

## 2016-01-27 DIAGNOSIS — Z79899 Other long term (current) drug therapy: Secondary | ICD-10-CM | POA: Diagnosis not present

## 2016-01-27 NOTE — Telephone Encounter (Signed)
PA denied. Alternative medications: Adderall XR, Concerta, Dexmethylphenidate HCL ER Capsules.

## 2016-01-28 NOTE — Telephone Encounter (Signed)
Try concerta 27 mg 1 po qd disp 30  Tell patient this is still a 10 hours medication  And  A beginning dose  And may need to increase dose  In future.

## 2016-01-31 NOTE — Telephone Encounter (Signed)
Left a message for a return call.

## 2016-02-01 ENCOUNTER — Telehealth: Payer: Self-pay | Admitting: Internal Medicine

## 2016-02-01 MED ORDER — METHYLPHENIDATE HCL ER (OSM) 27 MG PO TBCR: 27.0000 mg | EXTENDED_RELEASE_TABLET | ORAL | 0 refills | Status: DC

## 2016-02-01 NOTE — Addendum Note (Signed)
Addended by: Raj JanusADKINS, Wilkes Potvin T on: 02/01/2016 11:58 AM   Modules accepted: Orders

## 2016-02-01 NOTE — Telephone Encounter (Signed)
Pt is returning misty call °

## 2016-02-01 NOTE — Telephone Encounter (Signed)
Duplicate message. 

## 2016-02-01 NOTE — Telephone Encounter (Signed)
Spoke to the pt and informed her that Vyvanse was denied by her insurance.  Dr. Fabian SharpPanosh has changed the prescription to Concerta and will be available for pick up after 1 pm today.  Pt will pick up.

## 2016-02-08 DIAGNOSIS — Z113 Encounter for screening for infections with a predominantly sexual mode of transmission: Secondary | ICD-10-CM | POA: Diagnosis not present

## 2016-02-08 DIAGNOSIS — Z1151 Encounter for screening for human papillomavirus (HPV): Secondary | ICD-10-CM | POA: Diagnosis not present

## 2016-02-08 DIAGNOSIS — Z6821 Body mass index (BMI) 21.0-21.9, adult: Secondary | ICD-10-CM | POA: Diagnosis not present

## 2016-02-08 DIAGNOSIS — Z01419 Encounter for gynecological examination (general) (routine) without abnormal findings: Secondary | ICD-10-CM | POA: Diagnosis not present

## 2016-02-16 ENCOUNTER — Other Ambulatory Visit: Payer: Self-pay | Admitting: Internal Medicine

## 2016-02-17 NOTE — Telephone Encounter (Signed)
Sent to the pharmacy by e-scribe. 

## 2016-02-21 DIAGNOSIS — M5386 Other specified dorsopathies, lumbar region: Secondary | ICD-10-CM | POA: Diagnosis not present

## 2016-02-21 DIAGNOSIS — M9908 Segmental and somatic dysfunction of rib cage: Secondary | ICD-10-CM | POA: Diagnosis not present

## 2016-02-21 DIAGNOSIS — M9902 Segmental and somatic dysfunction of thoracic region: Secondary | ICD-10-CM | POA: Diagnosis not present

## 2016-02-21 DIAGNOSIS — M9903 Segmental and somatic dysfunction of lumbar region: Secondary | ICD-10-CM | POA: Diagnosis not present

## 2016-02-25 NOTE — Progress Notes (Signed)
Pre visit review using our clinic review tool, if applicable. No additional management support is needed unless otherwise documented below in the visit note.  Chief Complaint  Patient presents with  . Follow-up    HPI: Charlotta NewtonJulie Nourse 51 y.o.   Fu adhd med and decided med per  Insurance for vyvanse  So trial of concertal 27 mg    And have note from therapist  Jan 17th   Med effct first noted to be " Life changiung  " for the first  2 weeks.  And then   No se .   les anxiety .    Now is sleeping better.    Also less anxiety .  After 2 weeks  Leveled off  Except  Mind racing less.     But still  Some limitation focusing. .   Leveled off.   Only bad effect  Around 3-4   Get tired.  Then wearing off  8-98 get headaches .  Usually.   Work mates have noted some improvments      ROS: See pertinent positives and negatives per HPI.  Past Medical History:  Diagnosis Date  . Anxiety   . Depression   . Environmental allergies   . GERD (gastroesophageal reflux disease)   . Headache(784.0)   . Recurrent sinusitis    ent and pulm eval in past.    Family History  Problem Relation Age of Onset  . Heart disease      fhx    Social History   Social History  . Marital status: Single    Spouse name: N/A  . Number of children: N/A  . Years of education: N/A   Social History Main Topics  . Smoking status: Never Smoker  . Smokeless tobacco: Never Used  . Alcohol use Yes     Comment: Occasional use  . Drug use: No  . Sexual activity: Yes    Birth control/ protection: Pill   Other Topics Concern  . 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 ALPRAZolam (XANAX) 0.25 MG tablet Take 1 tablet (0.25 mg total) by mouth 3 (three) times daily as needed for anxiety (panic). 20 tablet 0  . B Complex Vitamins (VITAMIN-B COMPLEX PO) Take by mouth.    [name]. buPROPion (WELLBUTRIN XL) 150 MG  24 hr tablet Take 2 tablets (300 mg total) by mouth daily. 60 tablet 11  . fish oil-omega-3 fatty acids 1000 MG capsule Take 1 g by mouth daily.      . fluticasone (FLONASE) 50 MCG/ACT nasal spray USE 2 SPRAYS IN EACH NOSTRIL ONCE DAILY 48 g 1  . folic acid (FOLVITE) 1 MG tablet Take 1 mg by mouth daily.    [name]. LYSINE PO Take 1,000 mg by mouth daily. for fever blisters.    . norethindrone-ethinyl estradiol (JUNEL FE 1/20) 1-20 MG-MCG per tablet Take 1 tablet by mouth daily.      . SUMAtriptan (IMITREX) 100 MG tablet Take 1 at onset of  Migraine headache   and can repeat in 2 hours . 9 tablet 1  . valACYclovir (VALTREX) 1000 MG tablet TAKE 1 TABLET (1,000 MG TOTAL) BY MOUTH 2 (TWO) TIMES DAILY. 30 tablet 1  . vitamin C (ASCORBIC ACID) 500 MG tablet Take 500 mg by mouth daily as needed.     .[name]  Vitamin D, Ergocalciferol, (DRISDOL) 50000 units CAPS capsule TAKE 1 CAPSULE (50,000 UNITS TOTAL) BY MOUTH EVERY 7 (SEVEN) DAYS. 12 capsule 0  . methylphenidate (CONCERTA) 27 MG PO CR tablet Take 1 tablet (27 mg total) by mouth every morning. 30 tablet 0  . lisdexamfetamine (VYVANSE) 20 MG capsule Take 1 capsule (20 mg total) by mouth daily. 30 capsule 0   No facility-administered medications prior to visit.      EXAM:  BP 118/82 (BP Location: Left Arm, Patient Position: Sitting, Cuff Size: Normal)   Temp 98.8 F (37.1 C) (Oral)   Wt 121 lb (54.9 kg)   BMI 21.78 kg/m   Body mass index is 21.78 kg/m.  GENERAL: vitals reviewed and listed above, alert, oriented, appears well hydrated and in no acute distress PSYCH: pleasant and cooperative, no obvious depression or anxiety talkative   But  Not depressed or anxiety so much  See note from therapist   ASSESSMENT AND PLAN:  Discussed the following assessment and plan:  Attention deficit hyperactivity disorder (ADHD), unspecified ADHD type probable  Medication management Minor side effects of medicine which sounds like temporary need feeling at the  end of the medicine wearing off question headache. Otherwise seems to be doing quite well discussed risk benefit of going up on medicine can try the 36 mg with a coupon were whatever is less expensive. Plan follow-up in about a month.   Expectant management.  -Patient advised to return or notify health care team  if symptoms worsen ,persist or new concerns arise.  Patient Instructions  Ok to increase   To 36 mg   concerta     Monitor side effects that can mitigate.     [name]. Edric Fetterman M.D.

## 2016-02-28 ENCOUNTER — Encounter: Payer: Self-pay | Admitting: Internal Medicine

## 2016-02-28 ENCOUNTER — Ambulatory Visit (INDEPENDENT_AMBULATORY_CARE_PROVIDER_SITE_OTHER): Payer: BLUE CROSS/BLUE SHIELD | Admitting: Internal Medicine

## 2016-02-28 VITALS — BP 118/82 | Temp 98.8°F | Wt 121.0 lb

## 2016-02-28 DIAGNOSIS — F909 Attention-deficit hyperactivity disorder, unspecified type: Secondary | ICD-10-CM | POA: Diagnosis not present

## 2016-02-28 DIAGNOSIS — Z79899 Other long term (current) drug therapy: Secondary | ICD-10-CM

## 2016-02-28 MED ORDER — METHYLPHENIDATE HCL ER (OSM) 36 MG PO TBCR: 36.0000 mg | EXTENDED_RELEASE_TABLET | Freq: Every day | ORAL | 0 refills | Status: DC

## 2016-02-28 NOTE — Patient Instructions (Signed)
Ok to increase   To 36 mg   concerta     Monitor side effects that can mitigate.

## 2016-03-06 DIAGNOSIS — M5386 Other specified dorsopathies, lumbar region: Secondary | ICD-10-CM | POA: Diagnosis not present

## 2016-03-06 DIAGNOSIS — M9902 Segmental and somatic dysfunction of thoracic region: Secondary | ICD-10-CM | POA: Diagnosis not present

## 2016-03-06 DIAGNOSIS — M9903 Segmental and somatic dysfunction of lumbar region: Secondary | ICD-10-CM | POA: Diagnosis not present

## 2016-03-06 DIAGNOSIS — M9908 Segmental and somatic dysfunction of rib cage: Secondary | ICD-10-CM | POA: Diagnosis not present

## 2016-03-09 ENCOUNTER — Other Ambulatory Visit: Payer: Self-pay | Admitting: Family Medicine

## 2016-03-23 ENCOUNTER — Telehealth: Payer: Self-pay | Admitting: Internal Medicine

## 2016-03-23 NOTE — Telephone Encounter (Signed)
Pt request refill   methylphenidate (CONCERTA) 36 MG PO CR tablet  Pt states she is doing great on this dose.  Pt had a follow up appt on Monday, but states she doesn't think she needs to see Dr Fabian SharpPanosh since she is doing good. Is it ok to cancel the follow up on Monday?

## 2016-03-24 MED ORDER — METHYLPHENIDATE HCL ER (OSM) 36 MG PO TBCR: 36.0000 mg | EXTENDED_RELEASE_TABLET | Freq: Every day | ORAL | 0 refills | Status: DC

## 2016-03-24 NOTE — Telephone Encounter (Signed)
Left message on personalized VM that appt for Monday ok to cancel.   Also left message rx ready for pick up.

## 2016-03-24 NOTE — Telephone Encounter (Signed)
Ok to refill med   X 30 days   Can cancel Monday appt  ROV  In 3 months    And can call for refill when needed in the interim

## 2016-03-24 NOTE — Telephone Encounter (Signed)
FYI

## 2016-03-24 NOTE — Telephone Encounter (Signed)
Please advise 

## 2016-03-27 ENCOUNTER — Ambulatory Visit: Payer: BLUE CROSS/BLUE SHIELD | Admitting: Internal Medicine

## 2016-04-11 ENCOUNTER — Other Ambulatory Visit: Payer: Self-pay | Admitting: Internal Medicine

## 2016-04-12 NOTE — Telephone Encounter (Signed)
Pt needs refill on doxycyline 100 mg #60 w/refills for acne

## 2016-04-12 NOTE — Telephone Encounter (Signed)
No I havent been treating her with doxy for acne    Long term so   senied   Ifs this a  mistake in a request?

## 2016-04-16 ENCOUNTER — Other Ambulatory Visit: Payer: Self-pay | Admitting: Internal Medicine

## 2016-04-27 DIAGNOSIS — D225 Melanocytic nevi of trunk: Secondary | ICD-10-CM | POA: Diagnosis not present

## 2016-04-27 DIAGNOSIS — L719 Rosacea, unspecified: Secondary | ICD-10-CM | POA: Diagnosis not present

## 2016-04-27 DIAGNOSIS — L814 Other melanin hyperpigmentation: Secondary | ICD-10-CM | POA: Diagnosis not present

## 2016-04-27 DIAGNOSIS — L820 Inflamed seborrheic keratosis: Secondary | ICD-10-CM | POA: Diagnosis not present

## 2016-05-01 ENCOUNTER — Telehealth: Payer: Self-pay | Admitting: Internal Medicine

## 2016-05-01 MED ORDER — METHYLPHENIDATE HCL ER (OSM) 36 MG PO TBCR: 36.0000 mg | EXTENDED_RELEASE_TABLET | Freq: Every day | ORAL | 0 refills | Status: DC

## 2016-05-01 NOTE — Telephone Encounter (Signed)
Pt need new Rx for CONCERTA 36 MG  Pt is aware of 3 business days for refills and someone will call when ready for pick up.

## 2016-05-01 NOTE — Telephone Encounter (Signed)
Refill 30 printed

## 2016-05-01 NOTE — Telephone Encounter (Signed)
Left a voicemail for pt regarding rx ready for pickup

## 2016-05-16 ENCOUNTER — Emergency Department (HOSPITAL_COMMUNITY)
Admission: EM | Admit: 2016-05-16 | Discharge: 2016-05-17 | Disposition: A | Discharge status: Home / Self Care | Payer: BLUE CROSS/BLUE SHIELD | Attending: Emergency Medicine | Admitting: Emergency Medicine

## 2016-05-16 ENCOUNTER — Emergency Department (HOSPITAL_COMMUNITY): Payer: BLUE CROSS/BLUE SHIELD

## 2016-05-16 ENCOUNTER — Encounter (HOSPITAL_COMMUNITY): Payer: Self-pay | Admitting: *Deleted

## 2016-05-16 DIAGNOSIS — T148XXA Other injury of unspecified body region, initial encounter: Secondary | ICD-10-CM | POA: Diagnosis not present

## 2016-05-16 DIAGNOSIS — Y92410 Unspecified street and highway as the place of occurrence of the external cause: Secondary | ICD-10-CM | POA: Diagnosis not present

## 2016-05-16 DIAGNOSIS — M546 Pain in thoracic spine: Secondary | ICD-10-CM | POA: Diagnosis not present

## 2016-05-16 DIAGNOSIS — M25512 Pain in left shoulder: Secondary | ICD-10-CM | POA: Diagnosis not present

## 2016-05-16 DIAGNOSIS — Y999 Unspecified external cause status: Secondary | ICD-10-CM | POA: Insufficient documentation

## 2016-05-16 DIAGNOSIS — S299XXA Unspecified injury of thorax, initial encounter: Secondary | ICD-10-CM | POA: Diagnosis not present

## 2016-05-16 DIAGNOSIS — R0781 Pleurodynia: Secondary | ICD-10-CM | POA: Insufficient documentation

## 2016-05-16 DIAGNOSIS — Y939 Activity, unspecified: Secondary | ICD-10-CM | POA: Diagnosis not present

## 2016-05-16 DIAGNOSIS — Z79899 Other long term (current) drug therapy: Secondary | ICD-10-CM | POA: Insufficient documentation

## 2016-05-16 DIAGNOSIS — M549 Dorsalgia, unspecified: Secondary | ICD-10-CM | POA: Diagnosis not present

## 2016-05-16 DIAGNOSIS — M542 Cervicalgia: Secondary | ICD-10-CM | POA: Diagnosis not present

## 2016-05-16 DIAGNOSIS — M545 Low back pain: Secondary | ICD-10-CM | POA: Diagnosis not present

## 2016-05-16 LAB — POC URINE PREG, ED: Preg Test, Ur: NEGATIVE
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
## 2016-05-17 DIAGNOSIS — M545 Low back pain: Secondary | ICD-10-CM | POA: Diagnosis not present

## 2016-05-17 DIAGNOSIS — S299XXA Unspecified injury of thorax, initial encounter: Secondary | ICD-10-CM | POA: Diagnosis not present

## 2016-05-17 DIAGNOSIS — M546 Pain in thoracic spine: Secondary | ICD-10-CM | POA: Diagnosis not present

## 2016-05-17 DIAGNOSIS — R0781 Pleurodynia: Secondary | ICD-10-CM | POA: Diagnosis not present

## 2016-05-17 MED ORDER — ONDANSETRON 4 MG PO TBDP
4.0000 mg | ORAL_TABLET | Freq: Once | ORAL | Status: AC
  Administered 2016-05-17: 4 mg via ORAL
  Filled 2016-05-17: qty 1

## 2016-05-17 MED ORDER — METHOCARBAMOL 500 MG PO TABS
500.0000 mg | ORAL_TABLET | Freq: Once | ORAL | Status: AC
  Administered 2016-05-17: 500 mg via ORAL
  Filled 2016-05-17: qty 1

## 2016-05-17 MED ORDER — METHOCARBAMOL 500 MG PO TABS: 500.0000 mg | ORAL_TABLET | Freq: Two times a day (BID) | ORAL | 0 refills | Status: DC

## 2016-05-17 MED ORDER — LIDOCAINE 5 % EX PTCH: 1.0000 | MEDICATED_PATCH | CUTANEOUS | 0 refills | Status: DC

## 2016-05-17 NOTE — Discharge Instructions (Signed)
Expect your soreness to increase over the next 2-3 days. Take it easy, but do not lay around too much as this may make any stiffness worse.  Antiinflammatory medications: Take 440 mg of naproxen every 12 hours or 600 mg of ibuprofen every 6 hours for the next 3 days. Take these medications with food to avoid upset stomach. Choose only one of these medications, do not take them together.  Muscle relaxer: Robaxin is a muscle relaxer and may help loosen stiff muscles. Do not take the Robaxin while driving or performing other dangerous activities.   Lidocaine patches: These are available via either prescription or over-the-counter. The over-the-counter option may be more economical one and are likely just as effective. There are multiple over-the-counter brands, such as Salonpas.  Exercises: Be sure to perform the attached exercises starting with three times a week and working up to performing them daily. This is an essential part of preventing long term problems.   Follow up with a primary care provider for any future management of these complaints.

## 2016-05-17 NOTE — ED Notes (Signed)
Patient ambulatory and independent at discharge.  Verbalized understanding of discharge instructions. 

## 2016-05-19 ENCOUNTER — Other Ambulatory Visit: Payer: Self-pay | Admitting: Internal Medicine

## 2016-05-19 ENCOUNTER — Telehealth: Payer: Self-pay | Admitting: Emergency Medicine

## 2016-05-19 NOTE — Telephone Encounter (Signed)
lmom for pt to call back

## 2016-05-19 NOTE — Telephone Encounter (Signed)
Pt is due for a medication check. Sending a month supply. Could you schedule appointment.

## 2016-05-24 NOTE — Telephone Encounter (Signed)
lmom for pt to call back

## 2016-05-25 ENCOUNTER — Emergency Department (HOSPITAL_COMMUNITY): Payer: BLUE CROSS/BLUE SHIELD

## 2016-05-25 ENCOUNTER — Encounter (HOSPITAL_COMMUNITY): Payer: Self-pay | Admitting: Emergency Medicine

## 2016-05-25 ENCOUNTER — Emergency Department (HOSPITAL_COMMUNITY)
Admission: EM | Admit: 2016-05-25 | Discharge: 2016-05-25 | Disposition: A | Discharge status: Home / Self Care | Payer: BLUE CROSS/BLUE SHIELD | Attending: Emergency Medicine | Admitting: Emergency Medicine

## 2016-05-25 ENCOUNTER — Ambulatory Visit (INDEPENDENT_AMBULATORY_CARE_PROVIDER_SITE_OTHER): Payer: BLUE CROSS/BLUE SHIELD | Admitting: Orthopaedic Surgery

## 2016-05-25 ENCOUNTER — Encounter (INDEPENDENT_AMBULATORY_CARE_PROVIDER_SITE_OTHER): Payer: Self-pay | Admitting: Orthopaedic Surgery

## 2016-05-25 VITALS — Ht 63.0 in | Wt 120.0 lb

## 2016-05-25 DIAGNOSIS — R42 Dizziness and giddiness: Secondary | ICD-10-CM | POA: Diagnosis not present

## 2016-05-25 DIAGNOSIS — S060X0A Concussion without loss of consciousness, initial encounter: Secondary | ICD-10-CM | POA: Diagnosis not present

## 2016-05-25 DIAGNOSIS — Y92410 Unspecified street and highway as the place of occurrence of the external cause: Secondary | ICD-10-CM | POA: Diagnosis not present

## 2016-05-25 DIAGNOSIS — R51 Headache: Secondary | ICD-10-CM | POA: Diagnosis not present

## 2016-05-25 DIAGNOSIS — Y939 Activity, unspecified: Secondary | ICD-10-CM | POA: Diagnosis not present

## 2016-05-25 DIAGNOSIS — M4692 Unspecified inflammatory spondylopathy, cervical region: Secondary | ICD-10-CM | POA: Diagnosis not present

## 2016-05-25 DIAGNOSIS — M542 Cervicalgia: Secondary | ICD-10-CM | POA: Diagnosis not present

## 2016-05-25 DIAGNOSIS — M62838 Other muscle spasm: Secondary | ICD-10-CM

## 2016-05-25 DIAGNOSIS — R112 Nausea with vomiting, unspecified: Secondary | ICD-10-CM

## 2016-05-25 DIAGNOSIS — S0990XA Unspecified injury of head, initial encounter: Secondary | ICD-10-CM | POA: Diagnosis not present

## 2016-05-25 DIAGNOSIS — M47812 Spondylosis without myelopathy or radiculopathy, cervical region: Secondary | ICD-10-CM

## 2016-05-25 DIAGNOSIS — M545 Low back pain, unspecified: Secondary | ICD-10-CM

## 2016-05-25 DIAGNOSIS — G44309 Post-traumatic headache, unspecified, not intractable: Secondary | ICD-10-CM

## 2016-05-25 DIAGNOSIS — Y999 Unspecified external cause status: Secondary | ICD-10-CM | POA: Diagnosis not present

## 2016-05-25 MED ORDER — ACETAMINOPHEN 325 MG PO TABS
650.0000 mg | ORAL_TABLET | Freq: Once | ORAL | Status: AC
  Administered 2016-05-25: 650 mg via ORAL
  Filled 2016-05-25: qty 2

## 2016-05-25 MED ORDER — MECLIZINE HCL 25 MG PO TABS
25.0000 mg | ORAL_TABLET | Freq: Once | ORAL | Status: AC
  Administered 2016-05-25: 25 mg via ORAL
  Filled 2016-05-25: qty 1

## 2016-05-25 MED ORDER — MECLIZINE HCL 25 MG PO TABS: 25.0000 mg | ORAL_TABLET | Freq: Three times a day (TID) | ORAL | 0 refills | Status: DC | PRN

## 2016-05-25 NOTE — ED Triage Notes (Signed)
[facility] Patient is complaining of headache, visual changes, and difficulty processing/comprehending information since yesterday. Patient states that over the past 4 days she has began stuttering, having neck pain, nausea and eye pain. Patient conscious, alert, oriented, ambulatory.
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
## 2016-05-26 NOTE — Telephone Encounter (Signed)
lmom for pt to call back

## 2016-05-26 NOTE — Telephone Encounter (Signed)
Pt has been scheduled.  °
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
## 2016-05-30 ENCOUNTER — Ambulatory Visit (INDEPENDENT_AMBULATORY_CARE_PROVIDER_SITE_OTHER): Payer: BLUE CROSS/BLUE SHIELD | Admitting: Family Medicine

## 2016-05-30 ENCOUNTER — Encounter: Payer: Self-pay | Admitting: Family Medicine

## 2016-05-30 DIAGNOSIS — S060X0A Concussion without loss of consciousness, initial encounter: Secondary | ICD-10-CM

## 2016-05-30 DIAGNOSIS — S060X9A Concussion with loss of consciousness of unspecified duration, initial encounter: Secondary | ICD-10-CM | POA: Insufficient documentation

## 2016-05-30 DIAGNOSIS — S060XAA Concussion with loss of consciousness status unknown, initial encounter: Secondary | ICD-10-CM | POA: Insufficient documentation

## 2016-05-30 DIAGNOSIS — M4854XA Collapsed vertebra, not elsewhere classified, thoracic region, initial encounter for fracture: Secondary | ICD-10-CM | POA: Diagnosis not present

## 2016-05-30 DIAGNOSIS — S22000A Wedge compression fracture of unspecified thoracic vertebra, initial encounter for closed fracture: Secondary | ICD-10-CM | POA: Insufficient documentation

## 2016-05-30 MED ORDER — CALCITONIN (SALMON) 200 UNIT/ACT NA SOLN: 1.0000 | Freq: Every day | NASAL | 11 refills | Status: DC

## 2016-05-30 MED ORDER — VITAMIN D (ERGOCALCIFEROL) 1.25 MG (50000 UNIT) PO CAPS: 50000.0000 [IU] | ORAL_CAPSULE | ORAL | 0 refills | Status: DC
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
## 2016-05-30 NOTE — Patient Instructions (Addendum)
Good to see you again Calcitonin today to help with the back pain in case this is a new compression fracture.  Continue the once weekly vitamin D   Good to see you I do think you have a concussion.  I would like you to limit screen time (including your phone) to 30 minutes daily outside of homework.  No sport until you are back in school with no symptoms.  You may then start the return to play protocol I am giving you.  In addition to this I recommend......  To help improve COGNITIVE function: Using fish oil/omega 3 that is 1000 mg (or roughly 600 mg EPA/DHA), starting as soon as possible after concussion, take: 3 tabs THREE TIMES a day  for the first 3 days, then (you will smell a little, sory) 3 tabs TWICE DAILY  for the next 3 days, then 3 tabs ONCE DAILY  for the next 10 days   To help reduce HEADACHES: Coenzyme Q10 160mg  ONCE DAILY Riboflavin/Vitamin B2 400mg  ONCE DAILY Magnesium oxide 400mg  ONCE - TWICE DAILY May stop after headaches are resolved.                                                                                             To help with INSOMNIA: Melatonin 3-5mg  AT BEDTIME     Other medicines to help decrease inflammation Alpha Lipoic Acid 100mg  TWICE DAILY Turmeric 500mg  twice daily  I want to see you again in 1 week (ok to double book)  Have  A good trip

## 2016-05-31 ENCOUNTER — Telehealth: Payer: Self-pay | Admitting: Internal Medicine

## 2016-05-31 MED ORDER — METHYLPHENIDATE HCL ER (OSM) 36 MG PO TBCR: 36.0000 mg | EXTENDED_RELEASE_TABLET | Freq: Every day | ORAL | 0 refills | Status: DC

## 2016-05-31 NOTE — Telephone Encounter (Signed)
° °  Pt request refill of the following:   ° ° methylphenidate (CONCERTA) 36 MG PO CR tablet ° ° °Phamacy: °

## 2016-05-31 NOTE — Telephone Encounter (Signed)
Last seen 02/28/16 Last filled on 05/01/16 #30. Follow up scheduled for 06/12/16 Please advise.  Thanks!!

## 2016-05-31 NOTE — Telephone Encounter (Signed)
Left a message on identified voicemail informing the pt that the rx has been filled for 30 days only.  She will need to make an appt before this medication runs out.  Advised a call back if any questions.

## 2016-05-31 NOTE — Telephone Encounter (Signed)
Ok to refill x 1  Disp 30  And fu at her ROV

## 2016-06-06 ENCOUNTER — Ambulatory Visit (INDEPENDENT_AMBULATORY_CARE_PROVIDER_SITE_OTHER): Payer: BLUE CROSS/BLUE SHIELD | Admitting: Family Medicine

## 2016-06-06 ENCOUNTER — Encounter: Payer: Self-pay | Admitting: Family Medicine

## 2016-06-06 VITALS — BP 124/72 | HR 94 | Ht 63.0 in | Wt 114.0 lb

## 2016-06-06 DIAGNOSIS — F329 Major depressive disorder, single episode, unspecified: Secondary | ICD-10-CM | POA: Diagnosis not present

## 2016-06-06 DIAGNOSIS — S060X0D Concussion without loss of consciousness, subsequent encounter: Secondary | ICD-10-CM

## 2016-06-06 DIAGNOSIS — F4323 Adjustment disorder with mixed anxiety and depressed mood: Secondary | ICD-10-CM | POA: Diagnosis not present

## 2016-06-06 DIAGNOSIS — F419 Anxiety disorder, unspecified: Secondary | ICD-10-CM | POA: Diagnosis not present

## 2016-06-06 DIAGNOSIS — F32A Depression, unspecified: Secondary | ICD-10-CM

## 2016-06-06 DIAGNOSIS — M4854XA Collapsed vertebra, not elsewhere classified, thoracic region, initial encounter for fracture: Secondary | ICD-10-CM

## 2016-06-06 DIAGNOSIS — S22000A Wedge compression fracture of unspecified thoracic vertebra, initial encounter for closed fracture: Secondary | ICD-10-CM

## 2016-06-06 MED ORDER — HYDROXYZINE HCL 10 MG PO TABS: 10.0000 mg | ORAL_TABLET | Freq: Three times a day (TID) | ORAL | 0 refills | Status: DC | PRN

## 2016-06-06 NOTE — Assessment & Plan Note (Signed)
Continue calcitonin.
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
## 2016-06-06 NOTE — Assessment & Plan Note (Signed)
Anxiety and depression. Given hydroxyzine. Patient declined any type of treatment for lining depression at this point. We will need to monitor for more of a possibly PTSD syndrome. Follow-up again in 2-3 weeks.

## 2016-06-06 NOTE — Patient Instructions (Signed)
  Good to see you again Calcitonin today to help with the back pain in case this is a new compression fracture.  Continue the once weekly vitamin D    To help improve COGNITIVE function: Using fish oil/omega 3 that is 1000 mg (or roughly 600 mg EPA/DHA), starting as soon as possible after concussion, take: 3 tabs THREE TIMES a day  for the first 3 days, then (you will smell a little, sory) 3 tabs TWICE DAILY  for the next 3 days, then 3 tabs ONCE DAILY  for the next 10 days   To help reduce HEADACHES: Coenzyme Q10 160mg  ONCE DAILY Riboflavin/Vitamin B2 400mg  ONCE DAILY Magnesium oxide 400mg  ONCE - TWICE DAILY May stop after headaches are resolved.                                                                                             To help with INSOMNIA: Melatonin 3-5mg  AT BEDTIME    Hydroxyzine, a new prescription to help with the anxiety and try 1 pill up to 3 times a day  Try to do half days the next week  Then 3/4 for a week  Then full in 2 weeks.  See me again in 2 weeks.

## 2016-06-06 NOTE — Assessment & Plan Note (Signed)
Patient does have a concussion. Has not made significant change. Patient did not do any other suggestions we have discussed previously though. We discussed trying an over-the-counter medications. Patient will start to do it. We discussed which activities to potentially avoid. Patient does need help with anxiety and depression. Started on hydroxyzine. Has Ativan for breakthrough. Follow-up again in 2-3 weeks

## 2016-06-12 ENCOUNTER — Ambulatory Visit (INDEPENDENT_AMBULATORY_CARE_PROVIDER_SITE_OTHER): Payer: BLUE CROSS/BLUE SHIELD | Admitting: Internal Medicine

## 2016-06-12 ENCOUNTER — Encounter: Payer: Self-pay | Admitting: Internal Medicine

## 2016-06-12 VITALS — BP 150/70 | HR 102 | Temp 98.3°F | Ht 63.0 in | Wt 114.0 lb

## 2016-06-12 DIAGNOSIS — Z79899 Other long term (current) drug therapy: Secondary | ICD-10-CM | POA: Diagnosis not present

## 2016-06-12 DIAGNOSIS — F909 Attention-deficit hyperactivity disorder, unspecified type: Secondary | ICD-10-CM | POA: Diagnosis not present

## 2016-06-12 DIAGNOSIS — F4323 Adjustment disorder with mixed anxiety and depressed mood: Secondary | ICD-10-CM | POA: Diagnosis not present

## 2016-06-12 DIAGNOSIS — R4189 Other symptoms and signs involving cognitive functions and awareness: Secondary | ICD-10-CM | POA: Diagnosis not present

## 2016-06-12 DIAGNOSIS — S060X9S Concussion with loss of consciousness of unspecified duration, sequela: Secondary | ICD-10-CM

## 2016-06-12 NOTE — Patient Instructions (Addendum)
Less is best   For medication  In case could cause side effect.  For  Mental  Function.   You may have withdrawal from  concerta     In the future   we may need to  Change  Medication.  .   Consideration of seeing neurologist  About the spell  If  persistent or progressive .    I will send a message to  Dr Katrinka BlazingSmith about  What is going on  I would not  Prefer  To use  Alprazolam  for sleep .    ROV in 3-4 weeks      Post-Concussion Syndrome Post-concussion syndrome describes the symptoms that can occur after a head injury. These symptoms can last from weeks to months. What are the causes? It is not clear why some head injuries cause post-concussion syndrome. It can occur whether your head injury was mild or severe and whether you were wearing head protection or not. What are the signs or symptoms?  Memory difficulties.  Dizziness.  Headaches.  Double vision or blurry vision.  Sensitivity to light.  Hearing difficulties.  Depression.  Tiredness.  Weakness.  Difficulty with concentration.  Difficulty sleeping or staying asleep.  Vomiting.  Poor balance or instability on your feet.  Slow reaction time.  Difficulty learning and remembering things you have heard. How is this diagnosed? There is no test to determine whether you have post-concussion syndrome. Your health care provider may order an imaging scan of your brain, such as a CT scan, to check for other problems that may be causing your symptoms (such as a severe injury inside your skull). How is this treated? Usually, these problems disappear over time without medical care. Your health care provider may prescribe medicine to help ease your symptoms. It is important to follow up with a neurologist to evaluate your recovery and address any lingering symptoms or issues. Follow these instructions at home:  Take medicines only as directed by your health care provider. Do not take aspirin. Aspirin can slow blood  clotting.  Sleep with your head slightly elevated to help with headaches.  Avoid any situation where there is potential for another head injury. This includes football, hockey, soccer, basketball, martial arts, downhill snow sports, and horseback riding. Your condition will get worse every time you experience a concussion. You should avoid these activities until you are evaluated by the appropriate follow-up health care providers.  Keep all follow-up visits as directed by your health care provider. This is important. Contact a health care provider if:  You have increased problems paying attention or concentrating.  You have increased difficulty remembering or learning new information.  You need more time to complete tasks or assignments than before.  You have increased irritability or decreased ability to cope with stress.  You have more symptoms than before. Seek medical care if you have any of the following symptoms for more than two weeks after your injury:  Lasting (chronic) headaches.  Dizziness or balance problems.  Nausea.  Vision problems.  Increased sensitivity to noise or light.  Depression or mood swings.  Anxiety or irritability.  Memory problems.  Difficulty concentrating or paying attention.  Sleep problems.  Feeling tired all the time. Get help right away if:  You have confusion or unusual drowsiness.  Others find it difficult to wake you up.  You have nausea or persistent, forceful vomiting.  You feel like you are moving when you are not (vertigo). Your eyes may  move rapidly back and forth.  You have convulsions or faint.  You have severe, persistent headaches that are not relieved by medicine.  You cannot use your arms or legs normally.  One of your pupils is larger than the other.  You have clear or bloody discharge from your nose or ears.  Your problems are getting worse, not better. This information is not intended to replace advice  given to you by your health care provider. Make sure you discuss any questions you have with your health care provider. Document Released: 07/01/2001 Document Revised: 07/30/2015 Document Reviewed: 04/16/2013 Elsevier Interactive Patient Education  2017 [employer].
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
## 2016-06-13 ENCOUNTER — Ambulatory Visit: Payer: BLUE CROSS/BLUE SHIELD | Admitting: Family Medicine

## 2016-06-20 ENCOUNTER — Ambulatory Visit (INDEPENDENT_AMBULATORY_CARE_PROVIDER_SITE_OTHER): Payer: BLUE CROSS/BLUE SHIELD | Admitting: Family Medicine

## 2016-06-20 ENCOUNTER — Encounter: Payer: Self-pay | Admitting: Family Medicine

## 2016-06-20 VITALS — BP 124/72 | HR 93 | Ht 63.0 in | Wt 116.0 lb

## 2016-06-20 DIAGNOSIS — S060X0D Concussion without loss of consciousness, subsequent encounter: Secondary | ICD-10-CM

## 2016-06-20 DIAGNOSIS — R51 Headache: Secondary | ICD-10-CM

## 2016-06-20 DIAGNOSIS — R519 Headache, unspecified: Secondary | ICD-10-CM
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
## 2016-06-20 NOTE — Assessment & Plan Note (Signed)
Patient could be still having more of a postconcussive syndrome with no headaches as well as patient continuing to have the long amount of symptoms and now the new symptoms at the visual disturbances I do feel that patient should be worked up for further evaluation. Patient is also having some different smells she states. Seems to be more of an oral. Different than her regular migraines. Patient did have the positive test for potential autoimmune the patient has never been worked up on. There could be a concern for possible underlying vasculitis. At this point with patient not making any significant improvement I do feel that an MR angiogram of the head and neck would be beneficial. Patient will have this done and depending on findings we will discuss further treatment options.  Spent  25 minutes with patient face-to-face and had greater than 50% of counseling including action plan if worsening symptoms and when to seek medical attention, different treatment options and as well as different imaging including CT scan versus potential MRI. as described above in assessment and plan.

## 2016-06-20 NOTE — Patient Instructions (Addendum)
Good to see you I think you are improving but some of the symptoms are concerning Lets do MRI with dye of the head and neck  Fish oil now only 3 grams daily  Continue the other vitamins I hope the back and hip feels better  See me again in 2-3 weeks.

## 2016-07-04 ENCOUNTER — Encounter: Payer: Self-pay | Admitting: Family Medicine

## 2016-07-04 ENCOUNTER — Ambulatory Visit (INDEPENDENT_AMBULATORY_CARE_PROVIDER_SITE_OTHER): Payer: BLUE CROSS/BLUE SHIELD | Admitting: Family Medicine

## 2016-07-04 DIAGNOSIS — R519 Headache, unspecified: Secondary | ICD-10-CM | POA: Insufficient documentation

## 2016-07-04 DIAGNOSIS — G44311 Acute post-traumatic headache, intractable: Secondary | ICD-10-CM

## 2016-07-04 DIAGNOSIS — R51 Headache: Secondary | ICD-10-CM
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
## 2016-07-05 ENCOUNTER — Telehealth: Payer: Self-pay | Admitting: Internal Medicine

## 2016-07-05 MED ORDER — METHYLPHENIDATE HCL ER (OSM) 36 MG PO TBCR: 36.0000 mg | EXTENDED_RELEASE_TABLET | Freq: Every day | ORAL | 0 refills | Status: DC

## 2016-07-05 NOTE — Telephone Encounter (Signed)
° °  Pt request refill of the following:   ° ° methylphenidate (CONCERTA) 36 MG PO CR tablet ° ° °Phamacy: °

## 2016-07-05 NOTE — Telephone Encounter (Signed)
Please advise 

## 2016-07-05 NOTE — Telephone Encounter (Signed)
Left a VM for patient regarding prescription being ready for pickup 

## 2016-07-05 NOTE — Telephone Encounter (Signed)
Ok to refill x 1  printed Ov before next refill

## 2016-07-11 ENCOUNTER — Other Ambulatory Visit: Payer: Self-pay | Admitting: Internal Medicine

## 2016-07-12 NOTE — Telephone Encounter (Signed)
Left a VM for patient to give the office a call back regarding pt needing to schedule an appointment for further refills.

## 2016-07-14 NOTE — Telephone Encounter (Signed)
Terri [name] pt just saw dr Fabian Sharppanosh on 06-12-16 for medication management

## 2016-07-20 ENCOUNTER — Ambulatory Visit
Admission: RE | Admit: 2016-07-20 | Discharge: 2016-07-20 | Disposition: A | Discharge status: Home / Self Care | Payer: BLUE CROSS/BLUE SHIELD | Source: Ambulatory Visit | Attending: Family Medicine | Admitting: Family Medicine

## 2016-07-20 DIAGNOSIS — R51 Headache: Principal | ICD-10-CM

## 2016-07-20 DIAGNOSIS — S199XXA Unspecified injury of neck, initial encounter: Secondary | ICD-10-CM | POA: Diagnosis not present

## 2016-07-20 DIAGNOSIS — R519 Headache, unspecified: Secondary | ICD-10-CM

## 2016-07-20 DIAGNOSIS — S0990XA Unspecified injury of head, initial encounter: Secondary | ICD-10-CM | POA: Diagnosis not present

## 2016-07-20 MED ORDER — GADOBENATE DIMEGLUMINE 529 MG/ML IV SOLN
11.0000 mL | Freq: Once | INTRAVENOUS | Status: AC | PRN
  Administered 2016-07-20: 11 mL via INTRAVENOUS

## 2016-07-24 ENCOUNTER — Other Ambulatory Visit: Payer: Self-pay | Admitting: Family Medicine

## 2016-07-24 NOTE — Telephone Encounter (Signed)
Refill done.  

## 2016-08-03 ENCOUNTER — Telehealth: Payer: Self-pay | Admitting: Internal Medicine

## 2016-08-03 NOTE — Telephone Encounter (Signed)
Can refill x 1  ROV before next refill ( see may  note )

## 2016-08-03 NOTE — Telephone Encounter (Signed)
Pt needs new rx generic concerta 36 mg °

## 2016-08-03 NOTE — Telephone Encounter (Signed)
Please advise 

## 2016-08-04 ENCOUNTER — Other Ambulatory Visit: Payer: Self-pay | Admitting: Emergency Medicine

## 2016-08-04 ENCOUNTER — Telehealth: Payer: Self-pay | Admitting: Emergency Medicine

## 2016-08-04 MED ORDER — METHYLPHENIDATE HCL ER (OSM) 36 MG PO TBCR: 36.0000 mg | EXTENDED_RELEASE_TABLET | Freq: Every day | ORAL | 0 refills | Status: DC

## 2016-08-04 NOTE — Telephone Encounter (Signed)
Left a VM for patient regarding prescription being ready for pickup 

## 2016-08-04 NOTE — Telephone Encounter (Signed)
Medication has been refilled. Left a VM for patient regarding rx being ready for pick up

## 2016-08-07 ENCOUNTER — Ambulatory Visit (INDEPENDENT_AMBULATORY_CARE_PROVIDER_SITE_OTHER): Payer: BLUE CROSS/BLUE SHIELD | Admitting: Family Medicine

## 2016-08-07 ENCOUNTER — Encounter: Payer: Self-pay | Admitting: Family Medicine

## 2016-08-07 VITALS — BP 122/72 | HR 94 | Ht 64.0 in | Wt 116.0 lb

## 2016-08-07 DIAGNOSIS — M999 Biomechanical lesion, unspecified: Secondary | ICD-10-CM

## 2016-08-07 DIAGNOSIS — M4698 Unspecified inflammatory spondylopathy, sacral and sacrococcygeal region: Secondary | ICD-10-CM | POA: Diagnosis not present

## 2016-08-07 DIAGNOSIS — M533 Sacrococcygeal disorders, not elsewhere classified: Secondary | ICD-10-CM

## 2016-08-07 DIAGNOSIS — M47818 Spondylosis without myelopathy or radiculopathy, sacral and sacrococcygeal region: Secondary | ICD-10-CM

## 2016-08-07 NOTE — Assessment & Plan Note (Addendum)
Decision today to treat with OMT was based on Physical Exam  After verbal consent patient was treated with HVLA, ME, FPR techniques in cervical, thoracic, rib lumbar and sacral areas  Patient tolerated the procedure well with improvement in symptoms  Patient given exercises, stretches and lifestyle modifications  See medications in patient instructions if given  Patient will follow up in 4-6 weeks 

## 2016-08-07 NOTE — Progress Notes (Signed)
Tawana ScaleZach Smith D.O. [facility] [address] [place], [place] [address] Phone: [phone] Subjective:    I'm seeing this patient by the request  of:    CC: concussion follow-up for low back pain  OZH:[id number]:Subjective  Charlotta NewtonJulie Lobosco is a 51 y.o. female coming in with complaint of concussion. Patient was having recurrent difficulty. Patient was sent for an MRI secondary to some visual disturbances as well. MRI of patient's head and neck was independently visualized by me. MRI did not show any significant abnormality. Patient states that she is actually feeling 80% better at this time. Feels like she is getting closer to resolve but is still having endurance problems.  Patient is also complaining of low back pain. We have seen her Previously. We have done sacroiliac injections as well as manipulation. Feels like she is having some radicular symptoms going down the right side which is new side. No weakness. Still feels like she cannot do regular activities secondary to endurance problems.     Past Medical History:  Diagnosis Date  . Anxiety   . Depression   . Environmental allergies   . GERD (gastroesophageal reflux disease)   . Headache(784.0)   . Recurrent sinusitis    ent and pulm eval in past.   Past Surgical History:  Procedure Laterality Date  . HIP ARTHROSCOPY W/ LABRAL DEBRIDEMENT    . left hip surgery     [facility]   Social History   Social History  . Marital status: Single    Spouse name: N/A  . Number of children: N/A  . Years of education: N/A   Social History Main Topics  . Smoking status: Never Smoker  . Smokeless tobacco: Never Used  . Alcohol use Yes     Comment: Occasional use  . Drug use: No  . Sexual activity: Yes    Birth control/ protection: Pill   Other Topics Concern  . 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 Augmentin [Amoxicillin-Pot Clavulanate] Nausea Only  .  Celecoxib     REACTION: unspecified  . Sulfonamide Derivatives     REACTION: hives, throat swelling  . Monistat [Miconazole] Other (See Comments)    Sensitive to topical vaginal  Azoles No side effects with oral Diflucan   Family History  Problem Relation Age of Onset  . Heart disease Unknown        fhx    Past medical history, social, surgical and family history all reviewed in electronic medical record.  No pertanent information unless stated regarding to the chief complaint.   Review of Systems:Review of systems updated and as accurate as of 08/07/16  No visual changes, nausea, vomiting, diarrhea, constipation, dizziness, abdominal pain, skin rash, fevers, chills, night sweats, weight loss, swollen lymph nodes, body aches, joint swelling, chest pain, shortness of breath, mood changes.  Positive headache and muscle aches  Objective  Blood pressure 122/72, pulse 94, height 5\' 4"  (1.626 m), weight 116 lb (52.6 kg). Systems examined below as of 08/07/16   General: No apparent distress alert and oriented x3 mood and affect normal, dressed appropriately.  HEENT: Pupils equal, extraocular movements intact  Respiratory: Patient's speak in full sentences and does not appear short of breath  Cardiovascular: No lower extremity edema, non tender, no erythema  Skin: Warm dry intact with no signs of infection or rash on extremities or  on axial skeleton.  Abdomen: Soft nontender  Neuro: Cranial nerves II through XII are intact, neurovascularly intact in all extremities with 2+ DTRs and 2+ pulses.  Lymph: No lymphadenopathy of posterior or anterior cervical chain or axillae bilaterally.  Gait normal with good balance and coordination.  MSK:  Non tender with full range of motion and good stability and symmetric strength and tone of shoulders, elbows, wrist, hip, knee and ankles bilaterally.  Neck: Inspection unremarkable. No palpable stepoffs. Negative Spurling's maneuver. Are full range of  motion lacking the last 2 of rotation bilaterally Grip strength and sensation normal in bilateral hands Strength good C4 to T1 distribution No sensory change to C4 to T1 Negative Hoffman sign bilaterally Reflexes normal\  Back Exam:  Inspection: Unremarkable  Motion: Flexion 45 deg, Extension 45 deg, Side Bending to 45 deg bilaterally,  Rotation to 45 deg bilaterally  SLR laying: Negative  XSLR laying: Negative  Palpable tenderness: moderate to severe tenderness to palpation in the paraspinal musculature.[name] FABER: negative. Sensory change: Gross sensation intact to all lumbar and sacral dermatomes.  Reflexes: 2+ at both patellar tendons, 2+ at achilles tendons, Babinski's downgoing.  Strength at foot  Plantar-flexion: 5/5 Dorsi-flexion: 5/5 Eversion: 5/5 Inversion: 5/5  Leg strength  Quad: 5/5 Hamstring: 5/5 Hip flexor: 5/5 Hip abductors: 4/5  Gait unremarkable.  Osteopathic findings C2 flexed rotated and side bent right T3 extended rotated and side bent left inhaled third rib T7 extended rotated and side bent left L2 flexed rotated and side bent right Sacrum right on right    Impression and Recommendations:     This case required medical decision making of moderate complexity.      Note: This dictation was prepared with Dragon dictation along with smaller phrase technology. Any transcriptional errors that result from this process are unintentional.       ;

## 2016-08-07 NOTE — Assessment & Plan Note (Signed)
Recurrent sacroiliac dysfunction. Started manipulation again. We discussed icing regimen and home exercises. We discussed which activities to do in which ones to avoid.Discussed core strengthening and hip abductor strengthening. Encourage patient to start working out again on a regular basis.

## 2016-08-07 NOTE — Patient Instructions (Signed)
Good to see you  Ice is your friend Stay active Get back at it Continue the vitamin D See me again in 3-4 weeks.

## 2016-08-19 ENCOUNTER — Telehealth: Payer: Self-pay | Admitting: Internal Medicine

## 2016-08-21 NOTE — Telephone Encounter (Signed)
Patient needs to schedule a f/u visit for further refills. Please help patient schedule.

## 2016-08-21 NOTE — Telephone Encounter (Signed)
lmom for pt to call back

## 2016-08-23 NOTE — Telephone Encounter (Signed)
° ° ° °  Pt has been scheduled for 08/30/16

## 2016-08-28 ENCOUNTER — Encounter: Payer: Self-pay | Admitting: Family Medicine

## 2016-08-28 ENCOUNTER — Ambulatory Visit (INDEPENDENT_AMBULATORY_CARE_PROVIDER_SITE_OTHER): Payer: BLUE CROSS/BLUE SHIELD | Admitting: Family Medicine

## 2016-08-28 VITALS — BP 110/84 | HR 92 | Ht 64.0 in | Wt 117.0 lb

## 2016-08-28 DIAGNOSIS — M533 Sacrococcygeal disorders, not elsewhere classified: Secondary | ICD-10-CM

## 2016-08-28 DIAGNOSIS — M999 Biomechanical lesion, unspecified: Secondary | ICD-10-CM

## 2016-08-28 NOTE — Patient Instructions (Signed)
Good to see you  Good luck with the new job  DHEA 50 mg daily for 4 weeks See em again in 4 weeks!

## 2016-08-28 NOTE — Progress Notes (Signed)
[name] [address] [place], [place] [address] Phone: [phone] Subjective:    I'm seeing this patient by the request  of:    CC: concussion follow-up for low back pain  BJY:[id number]  Terri [name] is a 51 y.o. female coming in with complaint of concussion. Patient was having recurrent difficulty. Patient was sent for an MRI secondary to some visual disturbances as well. MRI of patient's head and neck was independently visualized by me. MRI did not show any significant abnormality. Patient states that she is 90% better. Still having some fatigue. States that the headaches and other symptoms are much better..  Patient is also complaining of low back pain. Patient describes the pain as a dull, throbbing aching pain. Patient states more on the right sign. Patient states that it seems to be in fairly severe. Has changed jobs and is doing more active things. Sitting in different ergonomic status on that she thinks is given some discomfort.     Past Medical History:  Diagnosis Date  . Anxiety   . Depression   . Environmental allergies   . GERD (gastroesophageal reflux disease)   . Headache(784.0)   . Recurrent sinusitis    ent and pulm eval in past.   Past Surgical History:  Procedure Laterality Date  . HIP ARTHROSCOPY W/ LABRAL DEBRIDEMENT    . left hip surgery     [facility]   Social History   Social History  . Marital status: Single    Spouse name: N/A  . Number of children: N/A  . Years of education: N/A   Social History Main Topics  . Smoking status: Never Smoker  . Smokeless tobacco: Never Used  . Alcohol use Yes     Comment: Occasional use  . Drug use: No  . Sexual activity: Yes    Birth control/ protection: Pill   Other Topics Concern  . 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 Augmentin [Amoxicillin-Pot Clavulanate] Nausea Only  . Celecoxib    REACTION: unspecified  . Sulfonamide Derivatives     REACTION: hives, throat swelling  . Monistat [Miconazole] Other (See Comments)    Sensitive to topical vaginal  Azoles No side effects with oral Diflucan   Family History  Problem Relation Age of Onset  . Heart disease Unknown        fhx    Past medical history, social, surgical and family history all reviewed in electronic medical record.  No pertanent information unless stated regarding to the chief complaint.   Review of Systems: No visual changes, nausea, vomiting, diarrhea, constipation, dizziness, abdominal pain, skin rash, fevers, chills, night sweats, weight loss, swollen lymph nodes, body aches, joint swelling,  chest pain, shortness of breath, mood changes.  Positive headaches muscle aches   Objective  Blood pressure 110/84, pulse 92, height 5\' 4"  (1.626 m), weight 117 lb (53.1 kg).   Systems examined below as of 08/28/16 General: NAD A&O x3 mood, affect normal  HEENT: Pupils equal, extraocular movements intact no nystagmus Respiratory: not short of breath at rest or with speaking Cardiovascular: No lower extremity edema, non tender Skin: Warm dry intact with no signs of infection or rash on extremities or on axial skeleton. Abdomen: Soft nontender, no masses Neuro: Cranial nerves  intact, neurovascularly intact in all extremities with 2+ DTRs and 2+ pulses. Lymph:  No lymphadenopathy appreciated today  Gait normal with good balance and coordination.  MSK: Non tender with full range of motion and good stability and symmetric strength and tone of shoulders, elbows, wrist,  knee hips and ankles bilaterally.  Mild hypermobility of all joints Neck: Inspection unremarkable. No palpable stepoffs. Negative Spurling's maneuver. Next last 5 of extension Grip strength and sensation normal in bilateral hands Strength good C4 to T1 distribution No sensory change to C4 to T1 Negative Hoffman sign bilaterally Reflexes  normal Back Exam:  Inspection: Unremarkable  Motion: Flexion 45 deg, Extension 25 deg, Side Bending to 45 deg bilaterally,  Rotation to 45 deg bilaterally  SLR laying: Negative  XSLR laying: Negative  Palpable tenderness: Mild increasing tenderness and appears palmar musculature lumbar spine right greater than left FABER: Positive right. Sensory change: Gross sensation intact to all lumbar and sacral dermatomes.  Reflexes: 2+ at both patellar tendons, 2+ at achilles tendons, Babinski's downgoing.  Strength at foot  Plantar-flexion: 5/5 Dorsi-flexion: 5/5 Eversion: 5/5 Inversion: 5/5  Leg strength  Quad: 5/5 Hamstring: 5/5 Hip flexor: 5/5 Hip abductors: 4/5 but symmetric Gait unremarkable.  Osteopathic findings C2 flexed rotated and side bent right C4 flexed rotated and side bent left C6 flexed rotated and side bent right T3 extended rotated and side bent right inhaled third rib L3 flexed rotated and side bent right Sacrum right on right Pelvic shear noted    Impression and Recommendations:     This case required medical decision making of moderate complexity.      Note: This dictation was prepared with Dragon dictation along with smaller phrase technology. Any transcriptional errors that result from this process are unintentional.       ;

## 2016-08-28 NOTE — Assessment & Plan Note (Signed)
Stable overall. We discussed icing regimen and home exercises. We discussed hip abductor strengthening. Patient will continue to be active. Patient will come back and see me again in 4-6 weeks

## 2016-08-28 NOTE — Assessment & Plan Note (Signed)
Decision today to treat with OMT was based on Physical Exam  After verbal consent patient was treated with HVLA, ME, FPR techniques in cervical, thoracic, lumbar and sacral pelvis area areas  Patient tolerated the procedure well with improvement in symptoms  Patient given exercises, stretches and lifestyle modifications  See medications in patient instructions if given  Patient will follow up in 4-6 weeks
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
## 2016-08-30 ENCOUNTER — Encounter: Payer: Self-pay | Admitting: Internal Medicine

## 2016-08-30 ENCOUNTER — Ambulatory Visit (INDEPENDENT_AMBULATORY_CARE_PROVIDER_SITE_OTHER): Payer: BLUE CROSS/BLUE SHIELD | Admitting: Internal Medicine

## 2016-08-30 VITALS — BP 110/70 | HR 74 | Temp 97.8°F | Wt 116.6 lb

## 2016-08-30 DIAGNOSIS — Z87820 Personal history of traumatic brain injury: Secondary | ICD-10-CM | POA: Diagnosis not present

## 2016-08-30 DIAGNOSIS — F909 Attention-deficit hyperactivity disorder, unspecified type: Secondary | ICD-10-CM

## 2016-08-30 DIAGNOSIS — Z79899 Other long term (current) drug therapy: Secondary | ICD-10-CM

## 2016-08-30 MED ORDER — ALPRAZOLAM 0.25 MG PO TABS: 0.2500 mg | ORAL_TABLET | Freq: Three times a day (TID) | ORAL | 0 refills | Status: DC | PRN

## 2016-08-30 MED ORDER — METHYLPHENIDATE HCL ER (OSM) 36 MG PO TBCR: 36.0000 mg | EXTENDED_RELEASE_TABLET | Freq: Every day | ORAL | 0 refills | Status: DC

## 2016-08-30 NOTE — Patient Instructions (Addendum)
Glad you are doing better  Same med for now  And then  Let us know when and if you want to adjust medication.  Will refill  Alprazolam  As needed for procedures  As planned.   rov in 4-6 months depending on how you are doing.

## 2016-09-11 ENCOUNTER — Other Ambulatory Visit: Payer: Self-pay | Admitting: Family Medicine

## 2016-09-13 ENCOUNTER — Other Ambulatory Visit: Payer: Self-pay | Admitting: Internal Medicine

## 2016-09-13 DIAGNOSIS — G44209 Tension-type headache, unspecified, not intractable: Secondary | ICD-10-CM | POA: Diagnosis not present

## 2016-09-14 ENCOUNTER — Ambulatory Visit: Payer: BLUE CROSS/BLUE SHIELD | Admitting: Internal Medicine

## 2016-09-21 ENCOUNTER — Other Ambulatory Visit: Payer: Self-pay | Admitting: Internal Medicine

## 2016-09-26 ENCOUNTER — Ambulatory Visit: Payer: BLUE CROSS/BLUE SHIELD | Admitting: Family Medicine

## 2016-10-11 ENCOUNTER — Telehealth (INDEPENDENT_AMBULATORY_CARE_PROVIDER_SITE_OTHER): Payer: Self-pay | Admitting: Orthopaedic Surgery

## 2016-10-11 NOTE — Telephone Encounter (Signed)
Received VM from [facility] @ [employer] checking status of request for records. I called her back [phone]. I left her VM advising that we do have have a request from their firm and I left both our fax lines for her to re send request.

## 2016-10-13 ENCOUNTER — Encounter: Payer: Self-pay | Admitting: Internal Medicine

## 2016-10-25 ENCOUNTER — Encounter: Payer: Self-pay | Admitting: Family Medicine

## 2016-10-25 ENCOUNTER — Ambulatory Visit (INDEPENDENT_AMBULATORY_CARE_PROVIDER_SITE_OTHER): Payer: BLUE CROSS/BLUE SHIELD | Admitting: Family Medicine

## 2016-10-25 VITALS — BP 120/76 | HR 88 | Ht 64.0 in | Wt 116.0 lb

## 2016-10-25 DIAGNOSIS — M533 Sacrococcygeal disorders, not elsewhere classified: Secondary | ICD-10-CM | POA: Diagnosis not present

## 2016-10-25 DIAGNOSIS — M999 Biomechanical lesion, unspecified: Secondary | ICD-10-CM

## 2016-10-25 MED ORDER — DIAZEPAM 5 MG PO TABS: ORAL_TABLET | ORAL | 0 refills | Status: DC

## 2016-10-25 NOTE — Patient Instructions (Signed)
Good to see you  Keep working on the pelvic floor Valium before the event We will get you ready for homecoming.  See you soon in the next 2 weeks.

## 2016-10-25 NOTE — Assessment & Plan Note (Signed)
Decision today to treat with OMT was based on Physical Exam  After verbal consent patient was treated with HVLA, ME, FPR techniques in cervical, thoracic, lumbar and sacral areas  Patient tolerated the procedure well with improvement in symptoms  Patient given exercises, stretches and lifestyle modifications  See medications in patient instructions if given  Patient will follow up in 4 weeks 

## 2016-10-25 NOTE — Assessment & Plan Note (Signed)
Continues to give her difficulty. I do think that there is some pelvic floor dysfunction that is also contribute in. Underlying autoimmune disease is a potential. I think patient to be having a flare. Patient wants to have a sacroiliac injection for diagnostic and therapeutic purposes. Given Valium and have patient come back at a different day. We discussed icing regimen, home exercises, which activities doing which ones to avoid. Patient will continue with some of the pelvic floor stretches that she has had from other providers previously. Patient will follow-up with me again to 3 weeks
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
## 2016-11-01 ENCOUNTER — Telehealth: Payer: Self-pay | Admitting: Internal Medicine

## 2016-11-01 NOTE — Telephone Encounter (Signed)
Pt request refill  methylphenidate (CONCERTA) 36 MG PO CR tablet

## 2016-11-02 NOTE — Telephone Encounter (Signed)
Last filled 08/30/2016, #30 x 2 prescriptions Upcoming appt with [name] 11/13/16 Requesting refill. Please advise Dr Fabian Sharp, thanks.

## 2016-11-03 ENCOUNTER — Other Ambulatory Visit: Payer: Self-pay

## 2016-11-03 MED ORDER — METHYLPHENIDATE HCL ER (OSM) 36 MG PO TBCR
36.0000 mg | EXTENDED_RELEASE_TABLET | Freq: Every day | ORAL | 0 refills | Status: DC
Start: 2016-11-03 — End: 2016-11-03

## 2016-11-03 MED ORDER — METHYLPHENIDATE HCL ER (OSM) 36 MG PO TBCR: 36.0000 mg | EXTENDED_RELEASE_TABLET | Freq: Every day | ORAL | 0 refills | Status: DC

## 2016-11-03 NOTE — Telephone Encounter (Signed)
Rx has been printed, signed and ready for pick up at the front desk, left a message for pt.

## 2016-11-03 NOTE — Telephone Encounter (Signed)
Pt called and stated that she will come pick up the Rx on Monday.

## 2016-11-03 NOTE — Telephone Encounter (Signed)
Ok to refill x 3 months  Ov before runs out.  Please print  Out so I can sign

## 2016-11-13 ENCOUNTER — Ambulatory Visit (INDEPENDENT_AMBULATORY_CARE_PROVIDER_SITE_OTHER): Payer: BLUE CROSS/BLUE SHIELD | Admitting: Family Medicine

## 2016-11-13 ENCOUNTER — Encounter: Payer: Self-pay | Admitting: Family Medicine

## 2016-11-13 ENCOUNTER — Ambulatory Visit: Payer: Self-pay

## 2016-11-13 VITALS — BP 122/82 | HR 118 | Wt 119.0 lb

## 2016-11-13 DIAGNOSIS — M47818 Spondylosis without myelopathy or radiculopathy, sacral and sacrococcygeal region: Secondary | ICD-10-CM

## 2016-11-13 DIAGNOSIS — M4698 Unspecified inflammatory spondylopathy, sacral and sacrococcygeal region: Secondary | ICD-10-CM

## 2016-11-13 DIAGNOSIS — M533 Sacrococcygeal disorders, not elsewhere classified: Secondary | ICD-10-CM

## 2016-11-13 NOTE — Patient Instructions (Signed)
Good to see you  Gustavus Bryantce is your friend.  Should be good starting tomorrow and increase improvement daily for 1-2 weeks Lets see you again in 2 weeks and we can do the other side or start manipulation again.

## 2016-11-13 NOTE — Progress Notes (Signed)
[name] [address] [place], [place] [address] Phone: [phone] Subjective:    I'm seeing this patient by the request  of:    CC: Left sacroiliac pain  [id number]  Terri [name] is a 51 y.o. female coming in for SI joint pain. She has been having rolfing from [employer] which has helped to increase her range of motion.  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 Possible autoimmune disease with patient's previous ANA titer. Patient is here in to potentially try something different.  Done osteopathic manipulation with mild improvement. Has been doing massage therapy. Patient though states that now is affecting daily activities with the pain.     Past Medical History:  Diagnosis Date  . Anxiety   . Depression   . Environmental allergies   . GERD (gastroesophageal reflux disease)   . Headache(784.0)   . Recurrent sinusitis    ent and pulm eval in past.   Past Surgical History:  Procedure Laterality Date  . HIP ARTHROSCOPY W/ LABRAL DEBRIDEMENT    . left hip surgery     [facility]   Social History   Social History  . Marital status: Single    Spouse name: N/A  . Number of children: N/A  . Years of education: N/A   Social History Main Topics  . Smoking status: Never Smoker  . Smokeless tobacco: Never Used  . Alcohol use Yes     Comment: Occasional use  . Drug use: No  . Sexual activity: Yes    Birth control/ protection: Pill   Other Topics Concern  . 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 Augmentin [Amoxicillin-Pot Clavulanate] Nausea Only  . Celecoxib     REACTION: unspecified  . Sulfonamide Derivatives     REACTION: hives, throat swelling  . Monistat [Miconazole] Other (See Comments)    Sensitive to topical vaginal  Azoles No side effects with oral Diflucan   Family  History  Problem Relation Age of Onset  . Heart disease Unknown        fhx     Past medical history, social, surgical and family history all reviewed in electronic medical record.  No pertanent information unless stated regarding to the chief complaint.   Review of Systems:Review of systems updated and as accurate as of 11/13/16  No  visual changes, nausea, vomiting, diarrhea, constipation, dizziness, abdominal pain, skin rash, fevers, chills, night sweats, weight loss, swollen lymph nodes,  chest pain, shortness of breath, mood changes. Positive muscle aches, body aches, intermittent joint swelling positive headaches  Objective  Blood pressure 122/82, pulse (!) 118, weight 119 lb (54 kg), SpO2 98 %. Systems examined below as of 11/13/16   General: No apparent distressBut is anxious alert and oriented x3 mood and affect normal, dressed appropriately.  HEENT: Pupils equal, extraocular movements intact  Respiratory: Patient's speak in full sentences and does not appear short of breath  Cardiovascular: No lower extremity edema, non tender, no erythema  Skin: Warm dry intact with no signs of infection or rash on extremities or on axial skeleton.  Abdomen: Soft nontender  Neuro: Cranial nerves II through XII are intact, neurovascularly intact in all extremities with 2+ DTRs and 2+ pulses.  Lymph: No lymphadenopathy of posterior or anterior cervical chain  or axillae bilaterally.  Gait normal with good balance and coordination.  MSK:  Non tender with full range of motion and good stability and symmetric strength and tone of shoulders, elbows, wrist, hip, knee and ankles bilaterally.  Back Exam:  Inspection: Mild loss of lordosis Motion: Flexion 45 deg, Extension 35 deg, Side Bending to 45 deg bilaterally,  Rotation to 45 deg bilaterally  SLR laying: Negative  XSLR laying: Negative  Palpable tenderness: Tender to palpation and appears palmar musculature lumbar spine left greater than  right. FABER: Positive left. Sensory change: Gross sensation intact to all lumbar and sacral dermatomes.  Reflexes: 2+ at both patellar tendons, 2+ at achilles tendons, Babinski's downgoing.  Strength at foot  Plantar-flexion: 5/5 Dorsi-flexion: 5/5 Eversion: 5/5 Inversion: 5/5  Leg strength  Quad: 5/5 Hamstring: 5/5 Hip flexor: 5/5 Hip abductors: 5/5  Gait unremarkable.  Procedure: Real-time Ultrasound Guided Injection of left sacroiliac joint Device: GE Logiq Q7 Ultrasound guided injection is preferred based studies that show increased duration, increased effect, greater accuracy, decreased procedural pain, increased response rate, and decreased cost with ultrasound guided versus blind injection.  Verbal informed consent obtained.  Time-out conducted.  Noted no overlying erythema, induration, or other signs of local infection.  Skin prepped in a sterile fashion.  Local anesthesia: Topical Ethyl chloride.  With sterile technique and under real time ultrasound guidance:  With a 21-gauge 2 inch needle patient was injected with a total of 2 mL of 0.5% Marcaine and 1 mL of Kenalog 40 mg/mL. Completed without difficulty  Pain immediately resolved suggesting accurate placement of the medication.  Advised to call if fevers/chills, erythema, induration, drainage, or persistent bleeding.  Images permanently stored and available for review in the ultrasound unit.  Impression: Technically successful ultrasound guided injection.   Impression and Recommendations:     This case required medical decision making of moderate complexity.      Note: This dictation was prepared with Dragon dictation along with smaller phrase technology. Any transcriptional errors that result from this process are unintentional.

## 2016-11-13 NOTE — Assessment & Plan Note (Signed)
Patient given injection today. Tolerated the procedure well. We discussed icing regimen and home exercises. We discussed which activities doing which ones to avoid. Topical anti-inflammatories. Following up in 2 weeks and restarting osteopathic manipulation.

## 2016-11-20 ENCOUNTER — Telehealth: Payer: Self-pay | Admitting: Family Medicine

## 2016-11-20 NOTE — Progress Notes (Signed)
[name] [address] [place], [place] [address] Phone: [phone] Subjective:    I'm seeing this patient by the request  of:    CC: Low back pain follow-up  [id number]  Terri [name] is a 51 y.o. female coming in with complaint of low back pain. Has sacroiliac pain. Attempted a sacroiliac injection on 11/13/2016.She had relief for one week. Patient states that the pain seemed to be worsening over the last week. Patient states that she was carrying some groceries up the stairs on Sunday and took a step and felt sudden pain. Her pain was worse yesterday and she was unable to work. All of her pain is left sided and radiated down her leg yesterday. She tried half a muscle relaxer last night which helped her sleep. She has pain with flexion and carrying items.      Past Medical History:  Diagnosis Date  . Anxiety   . Depression   . Environmental allergies   . GERD (gastroesophageal reflux disease)   . Headache(784.0)   . Recurrent sinusitis    ent and pulm eval in past.   Past Surgical History:  Procedure Laterality Date  . HIP ARTHROSCOPY W/ LABRAL DEBRIDEMENT    . left hip surgery     [facility]   Social History   Social History  . Marital status: Single    Spouse name: N/A  . Number of children: N/A  . Years of education: N/A   Social History Main Topics  . Smoking status: Never Smoker  . Smokeless tobacco: Never Used  . Alcohol use Yes     Comment: Occasional use  . Drug use: No  . Sexual activity: Yes    Birth control/ protection: Pill   Other Topics Concern  . 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 Augmentin [Amoxicillin-Pot Clavulanate] Nausea Only  . Celecoxib     REACTION: unspecified  . Sulfonamide Derivatives     REACTION: hives, throat swelling  . Monistat [Miconazole] Other (See Comments)    Sensitive to topical vaginal  Azoles No side effects  with oral Diflucan   Family History  Problem Relation Age of Onset  . Heart disease Unknown        fhx     Past medical history, social, surgical and family history all reviewed in electronic medical record.  No pertanent information unless stated regarding to the chief complaint.   Review of Systems:Review of systems updated and as accurate as of 11/21/16  No headache, visual changes, nausea, vomiting, diarrhea, constipation, dizziness, abdominal pain, skin rash, fevers, chills, night sweats, weight loss, swollen lymph nodes, body aches, joint swelling, muscle aches, chest pain, shortness of breath, mood changes.   Objective  Blood pressure 108/74, pulse (!) 104, weight 115 lb (52.2 kg), SpO2 98 %. Systems examined below as of 11/21/16   General: No apparent distress alert and oriented x3 mood and affect normal, dressed appropriately.  HEENT: Pupils equal, extraocular movements intact  Respiratory: Patient's speak in full sentences and does not appear short of breath  Cardiovascular: No lower extremity edema, non tender, no erythema  Skin: Warm dry intact with no signs of infection or rash on extremities or on axial skeleton.  Abdomen: Soft nontender  Neuro: Cranial nerves II through XII are intact, neurovascularly intact in all extremities with 2+ DTRs and  2+ pulses.  Lymph: No lymphadenopathy of posterior or anterior cervical chain or axillae bilaterally.  Gait normal with good balance and coordination.  MSK:  Non tender with full range of motion and good stability and symmetric strength and tone of shoulders, elbows, wrist, hip, knee and ankles bilaterally.  Back Exam:  Inspection: Loss of lordosis Motion: Flexion 25 deg with severe pain and radicular symptoms down the left leg, Extension 45 deg, Side Bending to 30 deg bilaterally,  Rotation to 35 deg bilaterally  SLR laying: Positive left and 20 XSLR laying: Negative  Palpable tenderness: Tender to palpation and appears palmar  suture lumbar spine. FABER: Tightness. Sensory change: Gross sensation intact to all lumbar and sacral dermatomes.  Reflexes: 2+ at both patellar tendons, 2+ at achilles tendons, Babinski's downgoing.  Strength at foot  4+ out of 5 on the left side    Impression and Recommendations:     This case required medical decision making of moderate complexity.      Note: This dictation was prepared with Dragon dictation along with smaller phrase technology. Any transcriptional errors that result from this process are unintentional.

## 2016-11-20 NOTE — Telephone Encounter (Signed)
Discussed with pt. Scheduled appt tomorrow at 2:15p.

## 2016-11-20 NOTE — Telephone Encounter (Signed)
Patient is unsure what to do. She states things are much worse than last week when she was seen on 10/22. She is in a lot more pain, she can not bend over. Please advise or follow up with patient. She has a 2 week fu on 11/6. Thank you.

## 2016-11-20 NOTE — Telephone Encounter (Signed)
Odd.  Maybe having a flare.  If we have a cancellation I can see her.  Or we can do prednisone for 5 days if she would like.  If she sounds bad we can work her in today or tomorrow.

## 2016-11-21 ENCOUNTER — Ambulatory Visit: Payer: Self-pay

## 2016-11-21 ENCOUNTER — Ambulatory Visit (INDEPENDENT_AMBULATORY_CARE_PROVIDER_SITE_OTHER): Payer: BLUE CROSS/BLUE SHIELD | Admitting: Family Medicine

## 2016-11-21 ENCOUNTER — Encounter: Payer: Self-pay | Admitting: Family Medicine

## 2016-11-21 VITALS — BP 108/74 | HR 104 | Wt 115.0 lb

## 2016-11-21 DIAGNOSIS — M545 Low back pain: Secondary | ICD-10-CM

## 2016-11-21 DIAGNOSIS — M5416 Radiculopathy, lumbar region: Secondary | ICD-10-CM

## 2016-11-21 MED ORDER — TRAMADOL HCL 50 MG PO TABS: 50.0000 mg | ORAL_TABLET | Freq: Three times a day (TID) | ORAL | 0 refills | Status: DC | PRN

## 2016-11-21 MED ORDER — METHYLPREDNISOLONE ACETATE 80 MG/ML IJ SUSP
80.0000 mg | Freq: Once | INTRAMUSCULAR | Status: AC
  Administered 2016-11-21: 80 mg via INTRAMUSCULAR

## 2016-11-21 MED ORDER — GABAPENTIN 100 MG PO CAPS: 200.0000 mg | ORAL_CAPSULE | Freq: Every day | ORAL | 3 refills | Status: DC

## 2016-11-21 MED ORDER — PREDNISONE 50 MG PO TABS: 50.0000 mg | ORAL_TABLET | Freq: Every day | ORAL | 0 refills | Status: DC

## 2016-11-21 NOTE — Patient Instructions (Signed)
Good to see yo u I think you have a herniated disc 2 injections today  Prednisone daily for 5 days  Gabaentin 200mg  at night Tramadol if really in pain  See me again in 1 week (ok to double book)

## 2016-11-21 NOTE — Assessment & Plan Note (Signed)
Concern lumbar radiculopathy. Patient is likely having more of a injury. I do believe the patient and her radicular symptoms that is injuring during acute herniated disc. Worsening symptoms advance imaging would be warranted. Patient to continue reflexes are intact but does have some mild weakness compared to contralateral side. Positive straight leg test. Given prednisone for 5 days and given injection today. Once weekly vitamin D. Gabapentin at night as well as given tramadol for breakthrough pain. Follow-up again in 1 week for further evaluation and treatment.

## 2016-11-25 ENCOUNTER — Other Ambulatory Visit: Payer: Self-pay | Admitting: Internal Medicine

## 2016-11-27 ENCOUNTER — Ambulatory Visit: Payer: BLUE CROSS/BLUE SHIELD | Admitting: Family Medicine

## 2016-11-27 NOTE — Progress Notes (Signed)
[name] [address] [place], [place] [address] Phone: [phone] Subjective:     CC: Back pain follow-up  [id number]  Terri [name] is a 51 y.o. female coming in with complaint of pain.  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  Patient did do better after a sacroiliac injection but unfortunately had an acute herniated disc.  Patient was given prednisone as well as gabapentin tramadol for breakthrough pain.  Patient states the radicular pain going down the leg previously seems to be doing significantly better.  States that the sacroiliac joint seems to be better as well.  Having right hip pain but very mild overall.  Think she is improving but is concerned about starting her regular daily activities again.       Past Medical History:  Diagnosis Date  . Anxiety   . Depression   . Environmental allergies   . GERD (gastroesophageal reflux disease)   . Headache(784.0)   . Recurrent sinusitis    ent and pulm eval in past.   Past Surgical History:  Procedure Laterality Date  . HIP ARTHROSCOPY W/ LABRAL DEBRIDEMENT    . left hip surgery     [facility]   Social History   Socioeconomic History  . Marital status: Single    Spouse name: None  . Number of children: None  . Years of education: None  . Highest education level: None  Social Needs  . Financial resource strain: None  . Food insecurity - worry: None  . Food insecurity - inability: None  . Transportation needs - medical: None  . Transportation needs - non-medical: None  Occupational History  . None  Tobacco Use  . Smoking status: Never Smoker  . Smokeless tobacco: Never Used  Substance and Sexual Activity  . Alcohol use: Yes    Comment: Occasional use  . Drug use: No  . Sexual activity: Yes    Birth control/protection: Pill  Other Topics Concern  . 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 Augmentin [Amoxicillin-Pot Clavulanate] Nausea Only  . Celecoxib     REACTION: unspecified  . Sulfonamide Derivatives     REACTION: hives, throat swelling  . Monistat [Miconazole] Other (See Comments)    Sensitive to topical vaginal  Azoles No side effects with oral Diflucan   Family History  Problem Relation Age of Onset  . Heart disease Unknown        fhx     Past medical history, social, surgical and family history all reviewed in electronic medical record.  No pertanent information unless stated regarding to the chief complaint.   Review of Systems:Review of systems updated and as accurate as of 11/28/16  No headache, visual changes, nausea, vomiting, diarrhea, constipation, dizziness, abdominal pain, skin rash, fevers, chills, night sweats, weight loss, swollen lymph nodes, body aches, joint swelling,  chest pain, shortness of breath, mood changes.  No muscle aches  Objective  Blood pressure 110/70, pulse 88, height 5\' 4"  (1.626 m), weight 117 lb (53.1 kg), SpO2 98 %. Systems examined below as of 11/28/16   General: No apparent distress alert and oriented x3 mood and affect normal, dressed appropriately.  HEENT: Pupils equal, extraocular movements intact  Respiratory: Patient's speak in full sentences and does not appear short of breath  Cardiovascular: No  lower extremity edema, non tender, no erythema  Skin: Warm dry intact with no signs of infection or rash on extremities or on axial skeleton.  Abdomen: Soft nontender  Neuro: Cranial nerves II through XII are intact, neurovascularly intact in all extremities with 2+ DTRs and 2+ pulses.  Lymph: No lymphadenopathy of posterior or anterior cervical chain or axillae bilaterally.  Gait normal with good balance and coordination.  MSK:  Non tender with full range of motion and good stability and symmetric strength and tone of shoulders, elbows, wrist, hip,  knee and ankles bilaterally.  Back Exam:  Inspection: Unremarkable  Motion: Flexion 40 deg, Extension 25 deg, Side Bending to 35 deg bilaterally,  Rotation to 45 deg bilaterally  SLR laying: Very minimal positive on the left but insignificant improvement. XSLR laying: Negative  Palpable tenderness: To palpation of the paraspinal musculature lumbar spine. FABER: Mild tightness in the left. Sensory change: Gross sensation intact to all lumbar and sacral dermatomes.  Reflexes: 2+ at both patellar tendons, 2+ at achilles tendons, Babinski's downgoing.  Strength at foot  Plantar-flexion: 5/5 Dorsi-flexion: 5/5 Eversion: 5/5 Inversion: 5/5  Leg strength  Quad: 5/5 Hamstring: 5/5 Hip flexor: 5/5 Hip abductors: 5/5  Gait unremarkable.    Impression and Recommendations:     This case required medical decision making of moderate complexity.      Note: This dictation was prepared with Dragon dictation along with smaller phrase technology. Any transcriptional errors that result from this process are unintentional.

## 2016-11-28 ENCOUNTER — Encounter: Payer: Self-pay | Admitting: Family Medicine

## 2016-11-28 ENCOUNTER — Ambulatory Visit: Payer: BLUE CROSS/BLUE SHIELD | Admitting: Family Medicine

## 2016-11-28 DIAGNOSIS — M5416 Radiculopathy, lumbar region: Secondary | ICD-10-CM

## 2016-11-28 NOTE — Patient Instructions (Signed)
Good to see you  You are doing well  Terri HoopsYu will do fine.  Can hike next weekend OK to stretch but may ice afterward Try to avoid repetitive flexing a little See me again in 3 weeks and we will start manipulation again

## 2016-11-28 NOTE — Assessment & Plan Note (Signed)
Significant improvement after the prednisone.  Near back to patient's baseline.  Patient was given home exercises slowly over the course the next 2 weeks.  Patient will follow up with me again in 3 weeks and will start manipulation therapy again.

## 2016-12-17 ENCOUNTER — Other Ambulatory Visit: Payer: Self-pay | Admitting: Internal Medicine

## 2016-12-20 ENCOUNTER — Encounter: Payer: Self-pay | Admitting: Family Medicine

## 2016-12-20 ENCOUNTER — Ambulatory Visit: Payer: BLUE CROSS/BLUE SHIELD | Admitting: Family Medicine

## 2016-12-20 VITALS — BP 110/78 | HR 105 | Ht 64.0 in | Wt 115.0 lb

## 2016-12-20 DIAGNOSIS — M5416 Radiculopathy, lumbar region: Secondary | ICD-10-CM

## 2016-12-20 DIAGNOSIS — M999 Biomechanical lesion, unspecified: Secondary | ICD-10-CM

## 2016-12-20 NOTE — Patient Instructions (Signed)
Good to see you  Terri [name] is your friend.  Colchicine if you want ot see if it is costochondritis 2 times a day fr 5 days Everything else looks so much better See me again in 4-8 weeks! Happy holidays!

## 2016-12-20 NOTE — Assessment & Plan Note (Signed)
No longer having radicular symptoms.  We discussed icing regimen and home exercises.  I still feel the underlying likely autoimmune disease is contributing.  We discussed which activities to do which wants to avoid.  Patient will follow up with me again in 4-8 weeks.

## 2016-12-20 NOTE — Assessment & Plan Note (Signed)
After verbal consent patient did have osteopathic manipulation with HVLA, muscle energy, as well as FPR in the cervical, thoracic, rib, lumbar and sacral areas. Postprocedure instructions given.  Follow-up again 4-8 weeks

## 2016-12-20 NOTE — Progress Notes (Signed)
Tawana ScaleZach Maybel Dambrosio D.O. [facility] [address] [place], [place] [address] Phone: [phone] Subjective:    I'm seeing this patient by the request  of:    CC: Back pain follow-up  BJY:NWGNFAOZHYHPI:Subjective  Charlotta NewtonJulie Ingles is a 51 y.o. female coming in with complaint of back pain.  Was doing significantly better after the sacroiliac injection.  Patient did have a mild herniated disc as well.  Patient is making significant progress.  Patient has been able to increase her activity again.  Patient has started to increase her activity in working out as well.  Working on her core. Patient also likely has unfortunately an autoimmune disease the patient has not seen rheumatology for.      Past Medical History:  Diagnosis Date  . Anxiety   . Depression   . Environmental allergies   . GERD (gastroesophageal reflux disease)   . Headache(784.0)   . Recurrent sinusitis    ent and pulm eval in past.   Past Surgical History:  Procedure Laterality Date  . HIP ARTHROSCOPY W/ LABRAL DEBRIDEMENT    . left hip surgery     [facility]   Social History   Socioeconomic History  . Marital status: Single    Spouse name: None  . Number of children: None  . Years of education: None  . Highest education level: None  Social Needs  . Financial resource strain: None  . Food insecurity - worry: None  . Food insecurity - inability: None  . Transportation needs - medical: None  . Transportation needs - non-medical: None  Occupational History  . None  Tobacco Use  . Smoking status: Never Smoker  . Smokeless tobacco: Never Used  Substance and Sexual Activity  . Alcohol use: Yes    Comment: Occasional use  . Drug use: No  . Sexual activity: Yes    Birth control/protection: Pill  Other Topics Concern  . 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 Augmentin [Amoxicillin-Pot Clavulanate] Nausea Only  . Celecoxib     REACTION: unspecified    . Sulfonamide Derivatives     REACTION: hives, throat swelling  . Monistat [Miconazole] Other (See Comments)    Sensitive to topical vaginal  Azoles No side effects with oral Diflucan   Family History  Problem Relation Age of Onset  . Heart disease Unknown        fhx     Past medical history, social, surgical and family history all reviewed in electronic medical record.  No pertanent information unless stated regarding to the chief complaint.   Review of Systems:Review of systems updated and as accurate as of 12/20/16  No headache, visual changes, nausea, vomiting, diarrhea, constipation, dizziness, abdominal pain, skin rash, fevers, chills, night sweats, weight loss, swollen lymph nodes, body aches, joint swelling,  chest pain, shortness of breath, mood changes.  Positive muscle aches  Objective  Blood pressure 110/78, pulse (!) 105, height 5\' 4"  (1.626 m), weight 115 lb (52.2 kg), SpO2 (!) 78 %. Systems examined below as of 12/20/16   General: No apparent distress alert and oriented x3 mood and affect normal, dressed appropriately.  HEENT: Pupils equal, extraocular movements intact  Respiratory: Patient's speak in full sentences and does not appear short of breath  Cardiovascular: No lower extremity edema, non tender, no erythema  Skin: Warm dry intact with no signs of infection or rash on  extremities or on axial skeleton.  Abdomen: Soft nontender  Neuro: Cranial nerves II through XII are intact, neurovascularly intact in all extremities with 2+ DTRs and 2+ pulses.  Lymph: No lymphadenopathy of posterior or anterior cervical chain or axillae bilaterally.  Gait normal with good balance and coordination.  MSK:  Non tender with full range of motion and good stability and symmetric strength and tone of shoulders, elbows, wrist, hip, knee and ankles bilaterally.  Patient does have hypermobility in multiple joints  Patient is back exam shows some mild hypermobility.  Still significant  discomfort over the paraspinal musculature of lumbar spine.  Mild decrease in extension from previous exam.  Negative straight leg test.  Mild positive Pearlean BrownieFaber test.  Osteopathic findings C2 flexed rotated and side bent right C4 flexed rotated and side bent left C6 flexed rotated and side bent left T3 extended rotated and side bent right inhaled third rib T9 extended rotated and side bent left L3 flexed rotated and side bent right Sacrum right on right     Impression and Recommendations:     This case required medical decision making of moderate complexity.      Note: This dictation was prepared with Dragon dictation along with smaller phrase technology. Any transcriptional errors that result from this process are unintentional.

## 2016-12-31 ENCOUNTER — Other Ambulatory Visit: Payer: Self-pay | Admitting: Internal Medicine

## 2017-01-08 ENCOUNTER — Telehealth: Payer: Self-pay | Admitting: Internal Medicine

## 2017-01-08 NOTE — Telephone Encounter (Signed)
Copied from CRM [phone]. Topic: Quick Communication - See Telephone Encounter >> Jan 08, 2017  3:36 PM Elliot GaultBell, Tiffany M wrote: CRM for notification. See Telephone encounter for:   01/08/17.   Relation to pt: self  Call back number:[phone] [id number] [place], [place] - [phone])    Reason for call:  Patient requesting methylphenidate (CONCERTA) 36 MG PO CR tablet refill, informed patient please allow 72 hour turnaround, please advise

## 2017-01-09 MED ORDER — METHYLPHENIDATE HCL ER (OSM) 36 MG PO TBCR: 36.0000 mg | EXTENDED_RELEASE_TABLET | Freq: Every day | ORAL | 0 refills | Status: DC

## 2017-01-09 NOTE — Telephone Encounter (Signed)
I emailed in a 30 day supply  

## 2017-01-09 NOTE — Telephone Encounter (Signed)
Please advise Dr Clent RidgesFry if able to refill this medication in Dr Rosezella FloridaPanosh's absence >> 1 month supply to last until Jan appt. Thanks.   Last filled 11/03/16, #30 x 2 Rx's  Upcoming appt 01/25/17 for med review [facility]

## 2017-01-09 NOTE — Telephone Encounter (Addendum)
Last filled 11/03/16, #[facility] 2 [facility]  Pt was supposed to follow up with Dr Fabian SharpPanosh before refilling again but there is no appt on file for December or January.    Madelin HeadingsPanosh, Wanda K, MD 11/03/16 1:11 PM Note  Ok to refill x 3 months  Ov before runs out.  Please print  Out so I can sign     LM for patient to advise of need for OV before refills.  Is the patient out of her meds? How many days left?

## 2017-01-09 NOTE — Telephone Encounter (Signed)
Copied from CRM [phone]. Topic: Quick Communication - See Telephone Encounter >> Jan 09, 2017  1:15 PM Terri [name], Rosey Batheresa D wrote: Patient schedule appt for 01/25/17. Please send enough to last her until her next appt.

## 2017-01-09 NOTE — Telephone Encounter (Signed)
Pt aware  rx sent Nothing further needed. 

## 2017-01-24 DIAGNOSIS — M79652 Pain in left thigh: Secondary | ICD-10-CM | POA: Diagnosis not present

## 2017-01-24 DIAGNOSIS — M25552 Pain in left hip: Secondary | ICD-10-CM | POA: Diagnosis not present

## 2017-01-24 NOTE — Progress Notes (Signed)
Chief Complaint  Patient presents with  . Medication Management    Concerta. Pt feels that the medication is not working. Discuss alternatives - Vyvanse    HPI: Terri [name] 52 y.o. come in for   Med check     Starting new business .   Cost an issue and vyvanse denied in past  For step therapy .   Over concussion ' hasn't seen rheum yet for her pos serology . Try a different med if   Affordable.   ROS: See pertinent positives and negatives per HPI.  Past Medical History:  Diagnosis Date  . Anxiety   . Depression   . Environmental allergies   . GERD (gastroesophageal reflux disease)   . Headache(784.0)   . Recurrent sinusitis    ent and pulm eval in past.    Family History  Problem Relation Age of Onset  . Heart disease Unknown        fhx    Social History   Socioeconomic History  . Marital status: Single    Spouse name: None  . Number of children: None  . Years of education: None  . Highest education level: None  Social Needs  . Financial resource strain: None  . Food insecurity - worry: None  . Food insecurity - inability: None  . Transportation needs - medical: None  . Transportation needs - non-medical: None  Occupational History  . None  Tobacco Use  . Smoking status: Never Smoker  . Smokeless tobacco: Never Used  Substance and Sexual Activity  . Alcohol use: Yes    Comment: Occasional use  . Drug use: No  . Sexual activity: Yes    Birth control/protection: Pill  Other Topics Concern  . 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 ALPRAZolam (XANAX) 0.25 MG tablet Take 1 tablet (0.25 mg total) by mouth 3 (three) times daily as needed for anxiety (panic). 20 tablet 0  . B Complex Vitamins (VITAMIN-B COMPLEX PO) Take by mouth.    [name]. buPROPion (WELLBUTRIN XL) 150 MG 24 hr tablet TAKE 2 TABLETS BY MOUTH EVERY DAY *NEED DR APPOINTMENT FOR FUTHER  REFILLS* 60 tablet 2  . calcitonin, salmon, (MIACALCIN) 200 UNIT/ACT nasal spray Place 1 spray into alternate nostrils daily. 3.7 mL 11  . diazepam (VALIUM) 5 MG tablet One tab by mouth, 2 hours before procedure. 2 tablet 0  . fish oil-omega-3 fatty acids 1000 MG capsule Take 1 g by mouth daily.      . fluticasone (FLONASE) 50 MCG/ACT nasal spray USE 2 SPRAYS INTO EACH NOSTRIL EVERY DAY 16 g 1  . folic acid (FOLVITE) 1 MG tablet Take 1 mg by mouth daily.    [name]. gabapentin (NEURONTIN) 100 MG capsule Take 2 capsules (200 mg total) by mouth at bedtime. 60 capsule 3  . hydrOXYzine (ATARAX/VISTARIL) 10 MG tablet Take 1 tablet (10 mg total) by mouth 3 (three) times daily as needed. 30 tablet 0  . LYSINE PO Take 1,000 mg by mouth daily. for fever blisters.    . meclizine (ANTIVERT) 25 MG tablet Take 1 tablet (25 mg total) by mouth 3 (three) times daily as needed for dizziness or nausea. 30 tablet 0  . methylphenidate (CONCERTA) 36 MG PO CR tablet Take 1 tablet (36 mg total) by mouth daily. 30 tablet 0  . methylphenidate (CONCERTA)  36 MG PO CR tablet Take 1 tablet (36 mg total) by mouth daily. 30 tablet 0  . norethindrone-ethinyl estradiol (JUNEL FE 1/20) 1-20 MG-MCG per tablet Take 1 tablet by mouth daily.      . Nutritional Supplements (DHEA PO) Take by mouth.    . predniSONE (DELTASONE) 50 MG tablet Take 1 tablet (50 mg total) by mouth daily. 5 tablet 0  . SUMAtriptan (IMITREX) 100 MG tablet Take 1 at onset of  Migraine headache   and can repeat in 2 hours . 9 tablet 1  . traMADol (ULTRAM) 50 MG tablet Take 1 tablet (50 mg total) by mouth every 8 (eight) hours as needed. 30 tablet 0  . valACYclovir (VALTREX) 1000 MG tablet TAKE 1 TABLET (1,000 MG TOTAL) BY MOUTH 2 (TWO) TIMES DAILY. 30 tablet 1  . vitamin C (ASCORBIC ACID) 500 MG tablet Take 500 mg by mouth daily as needed.     . Vitamin D, Ergocalciferol, (DRISDOL) 50000 units CAPS capsule TAKE 1 CAPSULE EVERY 7 DAYS 12 capsule 0   No  facility-administered medications prior to visit.      EXAM:  BP 96/62 (BP Location: Right Arm, Patient Position: Sitting, Cuff Size: Normal)   Pulse 77   Temp 98.3 F (36.8 C) (Oral)   Wt 117 lb 12.8 oz (53.4 kg)   BMI 20.22 kg/m   Body mass index is 20.22 kg/m.  GENERAL: vitals reviewed and listed above, alert, oriented, appears well hydrated and in no acute distress HEENT: atraumatic, conjunctiva  clear, no obvious abnormalities on inspection of external nose and ears PSYCH: pleasant and cooperative, no obvious depression or anxiety Lab Results  Component Value Date   GLUCOSE 103 (H) 07/12/2015   ALT 20 07/12/2015   AST 20 07/12/2015   NA 137 07/12/2015   K 3.7 07/12/2015   CL 105 07/12/2015   CREATININE 0.99 07/12/2015   BUN 14 07/12/2015   CO2 22 07/12/2015   BP Readings from Last 3 Encounters:  01/25/17 96/62  12/20/16 110/78  11/28/16 110/70    ASSESSMENT AND PLAN:  Discussed the following assessment and plan:  Attention deficit hyperactivity disorder (ADHD), unspecified ADHD type probable - Plan: Pain Mgmt, Profile 8 w/Conf, U  Medication management - Plan: Pain Mgmt, Profile 8 w/Conf, U  Positive ANA (antinuclear antibody) uds due and trial change med   mayneed PA   Plan rov in 3 mos advise still see  Rheum.  Total visit > 50% spent counseling and coordinating care as indicated in above note and in instructions to patient .  -Patient advised to return or notify health care team  if  new concerns arise.  Patient Instructions  Trail vyvanse 20 mg per day   Look for coupons on line  Let us know  if price point  Too much and can go back  To the concerta   Or think about other options.   uds today .   Plan ROV or contact follow up before runs out or    3 months     Wanda K. Panosh M.D.

## 2017-01-25 ENCOUNTER — Ambulatory Visit: Payer: BLUE CROSS/BLUE SHIELD | Admitting: Internal Medicine

## 2017-01-25 ENCOUNTER — Encounter: Payer: Self-pay | Admitting: Internal Medicine

## 2017-01-25 VITALS — BP 96/62 | HR 77 | Temp 98.3°F | Wt 117.8 lb

## 2017-01-25 DIAGNOSIS — R768 Other specified abnormal immunological findings in serum: Secondary | ICD-10-CM

## 2017-01-25 DIAGNOSIS — F909 Attention-deficit hyperactivity disorder, unspecified type: Secondary | ICD-10-CM | POA: Diagnosis not present

## 2017-01-25 DIAGNOSIS — Z79899 Other long term (current) drug therapy: Secondary | ICD-10-CM | POA: Diagnosis not present

## 2017-01-25 MED ORDER — LISDEXAMFETAMINE DIMESYLATE 20 MG PO CAPS: 20.0000 mg | ORAL_CAPSULE | Freq: Every day | ORAL | 0 refills | Status: DC

## 2017-01-25 NOTE — Patient Instructions (Signed)
Trail vyvanse 20 mg per day   Look for coupons on line  Let us know  if price point  Too much and can go back  To the concerta   Or think about other options.   uds today .   Plan ROV or contact follow up before runs out or    3 months

## 2017-01-26 LAB — PAIN MGMT, PROFILE 8 W/CONF, U
6 Acetylmorphine: NEGATIVE ng/mL (ref <10)
Alcohol Metabolites: NEGATIVE ng/mL (ref <500)
Amphetamines: NEGATIVE ng/mL (ref <500)
Benzodiazepines: NEGATIVE ng/mL (ref <100)
Buprenorphine, Urine: NEGATIVE ng/mL (ref <5)
Cocaine Metabolite: NEGATIVE ng/mL (ref <150)
Creatinine: 78.8 mg/dL
MDMA: NEGATIVE ng/mL (ref <500)
Marijuana Metabolite: NEGATIVE ng/mL (ref <20)
Opiates: NEGATIVE ng/mL (ref <100)
Oxidant: NEGATIVE ug/mL (ref <200)
Oxycodone: NEGATIVE ng/mL (ref <100)
pH: 6.02 (ref 4.5–9.0)

## 2017-01-31 DIAGNOSIS — M25552 Pain in left hip: Secondary | ICD-10-CM | POA: Diagnosis not present

## 2017-01-31 DIAGNOSIS — M79652 Pain in left thigh: Secondary | ICD-10-CM | POA: Diagnosis not present

## 2017-02-13 ENCOUNTER — Telehealth: Payer: Self-pay | Admitting: Internal Medicine

## 2017-02-13 MED ORDER — METHYLPHENIDATE HCL ER (OSM) 36 MG PO TBCR: 36.0000 mg | EXTENDED_RELEASE_TABLET | Freq: Every day | ORAL | 0 refills | Status: DC

## 2017-02-13 NOTE — Telephone Encounter (Signed)
Copied from CRM [phone]. Topic: Quick Communication - See Telephone Encounter >> Feb 13, 2017  9:35 AM Eston Mouldavis, Xoie Kreuser B wrote: CRM for notification. See Telephone encounter for:   Pt is asking that the rx for  methylphenidate (CONCERTA) 36 MG PO CR tablet  be sent to her pharmacy below as the  lisdexamfetamine (VYVANSE) 20 MG capsule was too expensive to get filled   [id number] [place] [place] - [address] [facility] [address] [phone] (Phone) [phone])    02/13/17.

## 2017-02-13 NOTE — Telephone Encounter (Signed)
sent 

## 2017-02-14 DIAGNOSIS — Z01419 Encounter for gynecological examination (general) (routine) without abnormal findings: Secondary | ICD-10-CM | POA: Diagnosis not present

## 2017-02-14 DIAGNOSIS — Z6821 Body mass index (BMI) 21.0-21.9, adult: Secondary | ICD-10-CM | POA: Diagnosis not present

## 2017-02-14 DIAGNOSIS — Z3041 Encounter for surveillance of contraceptive pills: Secondary | ICD-10-CM | POA: Diagnosis not present

## 2017-02-15 DIAGNOSIS — M79652 Pain in left thigh: Secondary | ICD-10-CM | POA: Diagnosis not present

## 2017-02-15 DIAGNOSIS — M25552 Pain in left hip: Secondary | ICD-10-CM | POA: Diagnosis not present

## 2017-02-16 NOTE — Telephone Encounter (Signed)
Left message notifying patient that medications were refilled electronically.

## 2017-02-18 NOTE — Progress Notes (Deleted)
Tawana ScaleZach Elva Breaker D.O. [facility] [address] [place], [place] [address] Phone: [phone] Subjective:    I'm seeing this patient by the request  of:    CC: Back pain follow-up  BJY:NWGNFAOZHYHPI:Subjective  Terri NewtonJulie [name] is a 52 y.o. female coming in with complaint of back pain.  Patient has had chronic back pain for quite some time.  Hypermobility syndrome.  Given an injection in the sacroiliac joint in October and did do significantly better.  Responded fairly well to osteopathic manipulation.  Patient states     Past Medical History:  Diagnosis Date  . Anxiety   . Depression   . Environmental allergies   . GERD (gastroesophageal reflux disease)   . Headache(784.0)   . Recurrent sinusitis    ent and pulm eval in past.   Past Surgical History:  Procedure Laterality Date  . HIP ARTHROSCOPY W/ LABRAL DEBRIDEMENT    . left hip surgery     [facility]   Social History   Socioeconomic History  . Marital status: Single    Spouse name: Not on file  . Number of children: Not on file  . Years of education: Not on file  . Highest education level: Not on file  Social Needs  . Financial resource strain: Not on file  . Food insecurity - worry: Not on file  . Food insecurity - inability: Not on file  . Transportation needs - medical: Not on file  . Transportation needs - non-medical: Not on file  Occupational History  . Not on file  Tobacco Use  . Smoking status: Never Smoker  . Smokeless tobacco: Never Used  Substance and Sexual Activity  . Alcohol use: Yes    Comment: Occasional use  . Drug use: No  . Sexual activity: Yes    Birth control/protection: Pill  Other Topics Concern  . 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 Augmentin [Amoxicillin-Pot Clavulanate] Nausea Only  . Celecoxib     REACTION: unspecified  . Sulfonamide Derivatives     REACTION: hives, throat swelling  . Monistat [Miconazole]  Other (See Comments)    Sensitive to topical vaginal  Azoles No side effects with oral Diflucan   Family History  Problem Relation Age of Onset  . Heart disease Unknown        fhx     Past medical history, social, surgical and family history all reviewed in electronic medical record.  No pertanent information unless stated regarding to the chief complaint.   Review of Systems:Review of systems updated and as accurate as of 02/18/17  No headache, visual changes, nausea, vomiting, diarrhea, constipation, dizziness, abdominal pain, skin rash, fevers, chills, night sweats, weight loss, swollen lymph nodes, body aches, joint swelling, muscle aches, chest pain, shortness of breath, mood changes.   Objective  There were no vitals taken for this visit. Systems examined below as of 02/18/17   General: No apparent distress alert and oriented x3 mood and affect normal, dressed appropriately.  HEENT: Pupils equal, extraocular movements intact  Respiratory: Patient's speak in full sentences and does not appear short of breath  Cardiovascular: No lower extremity edema, non tender, no erythema  Skin: Warm dry intact with no signs of infection or rash on extremities or on axial skeleton.  Abdomen: Soft nontender  Neuro: Cranial nerves II through XII are intact, neurovascularly intact in all extremities  with 2+ DTRs and 2+ pulses.  Lymph: No lymphadenopathy of posterior or anterior cervical chain or axillae bilaterally.  Gait normal with good balance and coordination.  MSK:  Non tender with full range of motion and good stability and symmetric strength and tone of shoulders, elbows, wrist, hip, knee and ankles bilaterally.     Impression and Recommendations:     This case required medical decision making of moderate complexity.      Note: This dictation was prepared with Dragon dictation along with smaller phrase technology. Any transcriptional errors that result from this process are  unintentional.

## 2017-02-19 ENCOUNTER — Ambulatory Visit: Payer: BLUE CROSS/BLUE SHIELD | Admitting: Family Medicine

## 2017-02-19 DIAGNOSIS — Z0289 Encounter for other administrative examinations: Secondary | ICD-10-CM

## 2017-02-26 DIAGNOSIS — M25552 Pain in left hip: Secondary | ICD-10-CM | POA: Diagnosis not present

## 2017-02-26 DIAGNOSIS — M79652 Pain in left thigh: Secondary | ICD-10-CM | POA: Diagnosis not present

## 2017-02-26 NOTE — Progress Notes (Signed)
Tawana ScaleZach Keoni Risinger D.O. [facility] [address] [place], [place] [address] Phone: [phone] Subjective:      CC: Back and neck pain follow-up  OZH:YQMVHQIONGHPI:Subjective  Terri NewtonJulie [name] is a 52 y.o. female coming in for follow up for back pain. She continues to have the radiating pain down the leg. The pain on the left SI has gone away but she notices pain on the right SI. Overall she states that she is doing better.  Patient states not as severe as what has been in the past.  Patient denies any numbness or tingling.      Past Medical History:  Diagnosis Date  . Anxiety   . Depression   . Environmental allergies   . GERD (gastroesophageal reflux disease)   . Headache(784.0)   . Recurrent sinusitis    ent and pulm eval in past.   Past Surgical History:  Procedure Laterality Date  . HIP ARTHROSCOPY W/ LABRAL DEBRIDEMENT    . left hip surgery     [facility]   Social History   Socioeconomic History  . Marital status: Single    Spouse name: None  . Number of children: None  . Years of education: None  . Highest education level: None  Social Needs  . Financial resource strain: None  . Food insecurity - worry: None  . Food insecurity - inability: None  . Transportation needs - medical: None  . Transportation needs - non-medical: None  Occupational History  . None  Tobacco Use  . Smoking status: Never Smoker  . Smokeless tobacco: Never Used  Substance and Sexual Activity  . Alcohol use: Yes    Comment: Occasional use  . Drug use: No  . Sexual activity: Yes    Birth control/protection: Pill  Other Topics Concern  . 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 Augmentin [Amoxicillin-Pot Clavulanate] Nausea Only  . Celecoxib     REACTION: unspecified  . Sulfonamide Derivatives     REACTION: hives, throat swelling  . Monistat [Miconazole] Other (See Comments)    Sensitive to topical vaginal  Azoles No side  effects with oral Diflucan   Family History  Problem Relation Age of Onset  . Heart disease Unknown        fhx     Past medical history, social, surgical and family history all reviewed in electronic medical record.  No pertanent information unless stated regarding to the chief complaint.   Review of Systems:Review of systems updated and as accurate as of 02/27/17  No headache, visual changes, nausea, vomiting, diarrhea, constipation, dizziness, abdominal pain, skin rash, fevers, chills, night sweats, weight loss, swollen lymph nodes, body aches, joint swelling, chest pain, shortness of breath, mood changes.  Positive muscle aches  Objective  Blood pressure 136/82, pulse 100, weight 120 lb (54.4 kg), SpO2 98 %. Systems examined below as of 02/27/17   General: No apparent distress alert and oriented x3 mood and affect normal, dressed appropriately.  HEENT: Pupils equal, extraocular movements intact  Respiratory: Patient's speak in full sentences and does not appear short of breath  Cardiovascular: No lower extremity edema, non tender, no erythema  Skin: Warm dry intact with no signs of infection or rash on extremities or on axial skeleton.  Abdomen: Soft nontender  Neuro: Cranial nerves II through XII are intact, neurovascularly intact in all extremities with 2+ DTRs and 2+ pulses.  Lymph: No lymphadenopathy of posterior or anterior cervical chain or axillae bilaterally.  Gait normal with good balance and coordination.  MSK:  Non tender with full range of motion and good stability and symmetric strength and tone of shoulders, elbows, wrist, hip, knee and ankles bilaterally.  Mild hypermobility syndrome. Back exam still shows some tenderness over the sacroiliac joints bilaterally left greater than right.  Positive Faber on the left side.  Significant tightness of the left hip flexor.  Negative straight leg test.  Tenderness over the piriformis bilaterally.  Mild pain over the paraspinal  musculature of the lumbar spine.  Strength is symmetric distally  Osteopathic findings C2 flexed rotated and side bent right C6 flexed rotated and side bent left T4 extended rotated and side bent right inhaled rib T6 extended rotated and side bent left L2 flexed rotated and side bent right Sacrum right on right      Impression and Recommendations:     This case required medical decision making of moderate complexity.      Note: This dictation was prepared with Dragon dictation along with smaller phrase technology. Any transcriptional errors that result from this process are unintentional.

## 2017-02-27 ENCOUNTER — Encounter: Payer: Self-pay | Admitting: Family Medicine

## 2017-02-27 ENCOUNTER — Ambulatory Visit: Payer: BLUE CROSS/BLUE SHIELD | Admitting: Family Medicine

## 2017-02-27 VITALS — BP 136/82 | HR 100 | Wt 120.0 lb

## 2017-02-27 DIAGNOSIS — M999 Biomechanical lesion, unspecified: Secondary | ICD-10-CM | POA: Diagnosis not present

## 2017-02-27 DIAGNOSIS — M533 Sacrococcygeal disorders, not elsewhere classified: Secondary | ICD-10-CM

## 2017-02-27 DIAGNOSIS — M940 Chondrocostal junction syndrome [Tietze]: Secondary | ICD-10-CM | POA: Diagnosis not present

## 2017-02-27 NOTE — Patient Instructions (Signed)
It has been 8 weeks and a little long I am impressed with integrative therapies.  Stay active See me again in 4 weeks

## 2017-02-27 NOTE — Assessment & Plan Note (Signed)
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  We discussed icing regimen and home exercise.  Feels that he weeks was too long.  Encourage patient to take the gabapentin on a more regular basis.  Follow-up again in 4 weeks

## 2017-02-27 NOTE — Assessment & Plan Note (Signed)
Discussed with patient again about posture and ergonomics.  More stress recently with her starting this.  Follow-up again in 4-6 weeks

## 2017-02-27 NOTE — Assessment & Plan Note (Signed)
Decision today to treat with OMT was based on Physical Exam  After verbal consent patient was treated with HVLA, ME, FPR techniques in cervical, thoracic, rib, lumbar and sacral areas  Patient tolerated the procedure well with improvement in symptoms  Patient given exercises, stretches and lifestyle modifications  See medications in patient instructions if given  Patient will follow up in 4 weeks 

## 2017-03-06 DIAGNOSIS — M79652 Pain in left thigh: Secondary | ICD-10-CM | POA: Diagnosis not present

## 2017-03-06 DIAGNOSIS — M25552 Pain in left hip: Secondary | ICD-10-CM | POA: Diagnosis not present

## 2017-03-08 ENCOUNTER — Other Ambulatory Visit: Payer: Self-pay | Admitting: Internal Medicine

## 2017-03-14 ENCOUNTER — Other Ambulatory Visit: Payer: Self-pay | Admitting: Family Medicine

## 2017-03-14 ENCOUNTER — Other Ambulatory Visit: Payer: Self-pay | Admitting: Internal Medicine

## 2017-03-16 ENCOUNTER — Other Ambulatory Visit: Payer: Self-pay | Admitting: Internal Medicine

## 2017-03-16 NOTE — Telephone Encounter (Signed)
Copied from CRM [id number]. Topic: Quick Communication - Rx Refill/Question >> Mar 16, 2017 12:06 PM Landry MellowFoltz, Melissa J wrote: Medication: concerta   Has the patient contacted their pharmacy? Yes.   Told to call   (Agent: If no, request that the patient contact the pharmacy for the refill.)   Preferred Pharmacy (with phone number or street name): [facility]    Agent: Please be advised that RX refills may take up to 3 business days. We ask that you follow-up with your pharmacy.

## 2017-03-16 NOTE — Telephone Encounter (Signed)
LOV: 01/25/17  Dr. Fabian SharpPanosh  [facility] [address]

## 2017-03-20 MED ORDER — METHYLPHENIDATE HCL ER (OSM) 36 MG PO TBCR: 36.0000 mg | EXTENDED_RELEASE_TABLET | Freq: Every day | ORAL | 0 refills | Status: DC

## 2017-03-20 NOTE — Telephone Encounter (Signed)
Sent in

## 2017-03-20 NOTE — Telephone Encounter (Signed)
Left detailed message letting her know that Rx has been sent in. Nothing further needed.

## 2017-03-20 NOTE — Telephone Encounter (Signed)
Last OV 01/25/17 Last refilled 02/13/17, #30 x 0rf  Pt came into office today to request refill again, is needing this today.  She will take her last pill today.   Please advise Dr Fabian SharpPanosh, thanks.

## 2017-03-21 DIAGNOSIS — M79652 Pain in left thigh: Secondary | ICD-10-CM | POA: Diagnosis not present

## 2017-03-21 DIAGNOSIS — M25552 Pain in left hip: Secondary | ICD-10-CM | POA: Diagnosis not present

## 2017-03-26 ENCOUNTER — Encounter: Payer: Self-pay | Admitting: Family Medicine

## 2017-03-26 DIAGNOSIS — Z130 Encounter for screening for diseases of the blood and blood-forming organs and certain disorders involving the immune mechanism: Secondary | ICD-10-CM | POA: Diagnosis not present

## 2017-03-26 DIAGNOSIS — Z833 Family history of diabetes mellitus: Secondary | ICD-10-CM | POA: Diagnosis not present

## 2017-03-26 DIAGNOSIS — Z13220 Encounter for screening for lipoid disorders: Secondary | ICD-10-CM | POA: Diagnosis not present

## 2017-03-26 DIAGNOSIS — Z1329 Encounter for screening for other suspected endocrine disorder: Secondary | ICD-10-CM | POA: Diagnosis not present

## 2017-03-26 DIAGNOSIS — Z0000 Encounter for general adult medical examination without abnormal findings: Secondary | ICD-10-CM | POA: Diagnosis not present

## 2017-03-26 DIAGNOSIS — Z3041 Encounter for surveillance of contraceptive pills: Secondary | ICD-10-CM | POA: Diagnosis not present

## 2017-03-27 NOTE — Progress Notes (Signed)
[name] [address] [place], [place] [address] Phone: [phone] Subjective:    I'm seeing this patient by the request  of:    CC: Back pain follow-up  BJY:NWGNFAOZHY  Terri [name] is a 52 y.o. female coming in with complaint of back pain. She states that she is feeling that same. She continues to get ROLFing which is lasting longer and longer. She said that the right side is starting to get tight and painful like what she describes a high hamstring pull. Her left SI joint is not bothering her any more.   Has responded to manipulation.  Once again patient has had a positive ANA but has avoided going to rheumatology.     Past Medical History:  Diagnosis Date  . Anxiety   . Depression   . Environmental allergies   . GERD (gastroesophageal reflux disease)   . Headache(784.0)   . Recurrent sinusitis    ent and pulm eval in past.   Past Surgical History:  Procedure Laterality Date  . HIP ARTHROSCOPY W/ LABRAL DEBRIDEMENT    . left hip surgery     [facility]   Social History   Socioeconomic History  . Marital status: Single    Spouse name: Not on file  . Number of children: Not on file  . Years of education: Not on file  . Highest education level: Not on file  Social Needs  . Financial resource strain: Not on file  . Food insecurity - worry: Not on file  . Food insecurity - inability: Not on file  . Transportation needs - medical: Not on file  . Transportation needs - non-medical: Not on file  Occupational History  . Not on file  Tobacco Use  . Smoking status: Never Smoker  . Smokeless tobacco: Never Used  Substance and Sexual Activity  . Alcohol use: Yes    Comment: Occasional use  . Drug use: No  . Sexual activity: Yes    Birth control/protection: Pill  Other Topics Concern  . 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 Augmentin [Amoxicillin-Pot  Clavulanate] Nausea Only  . Celecoxib     REACTION: unspecified  . Sulfonamide Derivatives     REACTION: hives, throat swelling  . Monistat [Miconazole] Other (See Comments)    Sensitive to topical vaginal  Azoles No side effects with oral Diflucan   Family History  Problem Relation Age of Onset  . Heart disease Unknown        fhx     Past medical history, social, surgical and family history all reviewed in electronic medical record.  No pertanent information unless stated regarding to the chief complaint.   Review of Systems:Review of systems updated and as accurate as of 03/28/17  No headache, visual changes, nausea, vomiting, diarrhea, constipation, dizziness, abdominal pain, skin rash, fevers, chills, night sweats, weight loss, swollen lymph nodes, body aches, joint swelling, muscle aches, chest pain, shortness of breath, mood changes.   Objective  Blood pressure 112/70, pulse (!) 102, height 5\' 4"  (1.626 m), weight 118 lb (53.5 kg), SpO2 99 %. Systems examined below as of 03/28/17   General: No apparent distress alert and oriented x3 mood and affect normal, dressed appropriately.  HEENT: Pupils equal, extraocular movements intact  Respiratory: Patient's speak in full sentences and does not appear short of breath  Cardiovascular: No  lower extremity edema, non tender, no erythema  Skin: Warm dry intact with no signs of infection or rash on extremities or on axial skeleton.  Abdomen: Soft nontender  Neuro: Cranial nerves II through XII are intact, neurovascularly intact in all extremities with 2+ DTRs and 2+ pulses.  Lymph: No lymphadenopathy of posterior or anterior cervical chain or axillae bilaterally.  Gait normal with good balance and coordination.  MSK:  Non tender with full range of motion and good stability and symmetric strength and tone of shoulders, elbows, wrist, hip, knee and ankles bilaterally.   Back Exam:  Inspection: Mild loss of lordosis Motion: Flexion 45 deg,  Extension 25 deg, Side Bending to 35 deg bilaterally,  Rotation to 45 deg bilaterally  SLR laying: Negative  XSLR laying: Negative  Palpable tenderness: Positive tenderness to palpation over the paraspinal musculature on the right side. FABER: Positive right. Sensory change: Gross sensation intact to all lumbar and sacral dermatomes.  Reflexes: 2+ at both patellar tendons, 2+ at achilles tendons, Babinski's downgoing.  Strength at foot  Plantar-flexion: 5/5 Dorsi-flexion: 5/5 Eversion: 5/5 Inversion: 5/5  Leg strength  Quad: 5/5 Hamstring: 5/5 Hip flexor: 5/5 Hip abductors: 5/5  Gait unremarkable.  Osteopathic findings C6 flexed rotated and side bent left T9 extended rotated and side bent left L2 flexed rotated and side bent right Sacrum right on right    Impression and Recommendations:     This case required medical decision making of moderate complexity.      Note: This dictation was prepared with Dragon dictation along with smaller phrase technology. Any transcriptional errors that result from this process are unintentional.

## 2017-03-28 ENCOUNTER — Ambulatory Visit: Payer: BLUE CROSS/BLUE SHIELD | Admitting: Family Medicine

## 2017-03-28 ENCOUNTER — Encounter: Payer: Self-pay | Admitting: Family Medicine

## 2017-03-28 VITALS — BP 112/70 | HR 102 | Ht 64.0 in | Wt 118.0 lb

## 2017-03-28 DIAGNOSIS — M47818 Spondylosis without myelopathy or radiculopathy, sacral and sacrococcygeal region: Secondary | ICD-10-CM

## 2017-03-28 DIAGNOSIS — M4698 Unspecified inflammatory spondylopathy, sacral and sacrococcygeal region: Secondary | ICD-10-CM

## 2017-03-28 DIAGNOSIS — M999 Biomechanical lesion, unspecified: Secondary | ICD-10-CM

## 2017-03-28 NOTE — Assessment & Plan Note (Signed)
Has responded well to the injections previously but seem to be coming back.  Discussed the possibility of PRP.  Discussed icing regimen and home exercises.  Discussed which activities to avoid.  Follow-up again in 4 weeks

## 2017-03-28 NOTE — Patient Instructions (Signed)
Great to see you  Overall not bad Lets monitor the SI joint  I think you are doing well  See me again in 4 week s

## 2017-03-28 NOTE — Assessment & Plan Note (Signed)
Decision today to treat with OMT was based on Physical Exam  After verbal consent patient was treated with HVLA, ME, FPR techniques in cervical, thoracic, lumbar and sacral areas  Patient tolerated the procedure well with improvement in symptoms  Patient given exercises, stretches and lifestyle modifications  See medications in patient instructions if given  Patient will follow up in 4 weeks 

## 2017-04-03 DIAGNOSIS — M25552 Pain in left hip: Secondary | ICD-10-CM | POA: Diagnosis not present

## 2017-04-03 DIAGNOSIS — M79652 Pain in left thigh: Secondary | ICD-10-CM | POA: Diagnosis not present

## 2017-04-18 DIAGNOSIS — M79652 Pain in left thigh: Secondary | ICD-10-CM | POA: Diagnosis not present

## 2017-04-18 DIAGNOSIS — M25552 Pain in left hip: Secondary | ICD-10-CM | POA: Diagnosis not present

## 2017-04-22 NOTE — Progress Notes (Signed)
[name] [address] [place], [place] [address] Phone: [phone] Subjective:     CC: Back pain, new elbow pain  BJY:NWGNFAOZHY  Terri [name] is a 52 y.o. female coming in with complaint of back pain. She has been having pain in her neck since last visit. She is having left elbow pain on the lateral aspect. She has decreased range of motion. She also has numbness in the thumb. Daily she wakes up with arms tingling and "asleep". She has been doing some massage on the forearm which alleviates her pain. Pain with pronation/supination and extension.  Left elbow can even hurt at night.  Rates the severity of pain is 7 out of 10.  Seems to be having increasing discomfort even though she does not know of any injury.  Back and neck pain seems to be fairly stable overall except some more tightness recently.  Has responded fairly well to the osteopathic manipulation.  Has had an elevated inflammatory markers previously but patient has still declined referral for rheumatology.     Past Medical History:  Diagnosis Date  . Anxiety   . Depression   . Environmental allergies   . GERD (gastroesophageal reflux disease)   . Headache(784.0)   . Recurrent sinusitis    ent and pulm eval in past.   Past Surgical History:  Procedure Laterality Date  . HIP ARTHROSCOPY W/ LABRAL DEBRIDEMENT    . left hip surgery     [facility]   Social History   Socioeconomic History  . Marital status: Single    Spouse name: Not on file  . Number of children: Not on file  . Years of education: Not on file  . Highest education level: Not on file  Occupational History  . Not on file  Social Needs  . Financial resource strain: Not on file  . Food insecurity:    Worry: Not on file    Inability: Not on file  . Transportation needs:    Medical: Not on file    Non-medical: Not on file  Tobacco Use  . Smoking status: Never Smoker  . Smokeless tobacco: Never Used  Substance and Sexual  Activity  . Alcohol use: Yes    Comment: Occasional use  . Drug use: No  . Sexual activity: Yes    Birth control/protection: Pill  Lifestyle  . Physical activity:    Days per week: Not on file    Minutes per session: Not on file  . Stress: Not on file  Relationships  . Social connections:    Talks on phone: Not on file    Gets together: Not on file    Attends religious service: Not on file    Active member of club or organization: Not on file    Attends meetings of clubs or organizations: Not on file    Relationship status: Not on file  Other Topics Concern  . 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 Augmentin [Amoxicillin-Pot Clavulanate] Nausea Only  . Celecoxib     REACTION: unspecified  . Sulfonamide Derivatives     REACTION: hives, throat swelling  . Monistat [Miconazole] Other (See Comments)    Sensitive to topical vaginal  Azoles No side effects with oral Diflucan   Family History  Problem Relation Age of Onset  . Heart disease Unknown  fhx     Past medical history, social, surgical and family history all reviewed in electronic medical record.  No pertanent information unless stated regarding to the chief complaint.   Review of Systems:Review of systems updated and as accurate as of 04/23/17  No headache, visual changes, nausea, vomiting, diarrhea, constipation, dizziness, abdominal pain, skin rash, fevers, chills, night sweats, weight loss, swollen lymph nodes, body aches, joint swelling, muscle aches, chest pain, shortness of breath, mood changes.  Positive muscle aches  Objective  Blood pressure 122/84, pulse 89, height 5\' 4"  (1.626 m), weight 116 lb (52.6 kg), SpO2 98 %. Systems examined below as of 04/23/17   General: No apparent distress alert and oriented x3 mood and affect normal, dressed appropriately.  HEENT: Pupils equal, extraocular movements intact  Respiratory:  Patient's speak in full sentences and does not appear short of breath  Cardiovascular: No lower extremity edema, non tender, no erythema  Skin: Warm dry intact with no signs of infection or rash on extremities or on axial skeleton.  Abdomen: Soft nontender  Neuro: Cranial nerves II through XII are intact, neurovascularly intact in all extremities with 2+ DTRs and 2+ pulses.  Lymph: No lymphadenopathy of posterior or anterior cervical chain or axillae bilaterally.  Gait normal with good balance and coordination.  MSK:  Non tender with full range of motion and good stability and symmetric strength and tone of shoulders, wrist, hip, knee and ankles bilaterally.  Hypermobility of multiple joints Back Exam:  Inspection: Unremarkable  Motion: Flexion 45 deg, Extension 25 deg, Side Bending to 35 deg bilaterally,  Rotation to 45 deg bilaterally  SLR laying: Negative  XSLR laying: Negative  Palpable tenderness: Tender to palpation the paraspinal musculature lumbar spine right greater than left. FABER: Mild tightness of the right. Sensory change: Gross sensation intact to all lumbar and sacral dermatomes.  Reflexes: 2+ at both patellar tendons, 2+ at achilles tendons, Babinski's downgoing.  Strength at foot  Plantar-flexion: 5/5 Dorsi-flexion: 5/5 Eversion: 5/5 Inversion: 5/5  Leg strength  Quad: 5/5 Hamstring: 5/5 Hip flexor: 5/5 Hip abductors: 5/5  Gait unremarkable.  Elbow: Left Unremarkable to inspection. Range of motion full pronation, supination, flexion, extension. Strength is full to all of the above directions Stable to varus, valgus stress. Negative moving valgus stress test. Tenderness to palpation of the musculature and more over the lateral epicondylar area Ulnar nerve does not sublux. Negative cubital tunnel Tinel's.     Musculoskeletal ultrasound was performed and interpreted by Terri [name] D.O.   Elbow:  Lateral epicondyle and common extensor tendon origin visualized.   Patient does have what appears to be intrasubstance tearing noted with increased vascularity.  IMPRESSION: Common extensor tendon tear     Impression and Recommendations:     This case required medical decision making of moderate complexity.      Note: This dictation was prepared with Dragon dictation along with smaller phrase technology. Any transcriptional errors that result from this process are unintentional.

## 2017-04-23 ENCOUNTER — Ambulatory Visit: Payer: Self-pay

## 2017-04-23 ENCOUNTER — Encounter: Payer: Self-pay | Admitting: Family Medicine

## 2017-04-23 ENCOUNTER — Ambulatory Visit: Payer: BLUE CROSS/BLUE SHIELD | Admitting: Family Medicine

## 2017-04-23 VITALS — BP 122/84 | HR 89 | Ht 64.0 in | Wt 116.0 lb

## 2017-04-23 DIAGNOSIS — M25522 Pain in left elbow: Secondary | ICD-10-CM | POA: Diagnosis not present

## 2017-04-23 DIAGNOSIS — M533 Sacrococcygeal disorders, not elsewhere classified: Secondary | ICD-10-CM

## 2017-04-23 DIAGNOSIS — M7712 Lateral epicondylitis, left elbow: Secondary | ICD-10-CM | POA: Insufficient documentation

## 2017-04-23 DIAGNOSIS — M999 Biomechanical lesion, unspecified: Secondary | ICD-10-CM

## 2017-04-23 NOTE — Patient Instructions (Signed)
Good to see you.  Ice 20 minutes 2 times daily. Usually after activity and before bed. pennsaid pinkie amount topically 2 times daily as needed.  Exercises 3 times a week.  Wrist brace day and night for 2 weeks and then nightly for 2 weeks.  No lifting overhand.  See me again in 4 weeks

## 2017-04-23 NOTE — Assessment & Plan Note (Signed)
Decision today to treat with OMT was based on Physical Exam  After verbal consent patient was treated with HVLA, ME, FPR techniques in cervical, thoracic, lumbar and sacral areas  Patient tolerated the procedure well with improvement in symptoms  Patient given exercises, stretches and lifestyle modifications  See medications in patient instructions if given  Patient will follow up in 4 weeks 

## 2017-04-23 NOTE — Assessment & Plan Note (Signed)
Lateral Epicondylitis: Elbow anatomy was reviewed, and tendinopathy was explained.  Pt. given a formal rehab program. Series of concentric and eccentric exercises should be done starting with no weight, work up to 1 lb, hammer, etc.  Use counterforce strap if working or using hands.  Formal PT would be beneficial. Emphasized stretching an cross-friction massage Emphasized proper palms up lifting biomechanics to unload ECRB Wrist brace as well.  pennsaid given  RTC in 4 weeks

## 2017-04-23 NOTE — Assessment & Plan Note (Signed)
Discussed with patient again.  I do believe that there is some inflammatory arthritis also noted.  Discussed icing regimen and home exercises.  Discussed which activities to do which wants to avoid.  Patient is to increase activity as tolerated.  Follow-up with me again in 4 weeks

## 2017-04-30 ENCOUNTER — Ambulatory Visit: Payer: BLUE CROSS/BLUE SHIELD | Admitting: Internal Medicine

## 2017-04-30 DIAGNOSIS — M25552 Pain in left hip: Secondary | ICD-10-CM | POA: Diagnosis not present

## 2017-04-30 DIAGNOSIS — M79652 Pain in left thigh: Secondary | ICD-10-CM | POA: Diagnosis not present

## 2017-05-07 ENCOUNTER — Ambulatory Visit: Payer: BLUE CROSS/BLUE SHIELD | Admitting: Internal Medicine

## 2017-05-07 ENCOUNTER — Encounter: Payer: Self-pay | Admitting: Internal Medicine

## 2017-05-07 VITALS — BP 104/62 | HR 89 | Temp 98.4°F | Wt 117.5 lb

## 2017-05-07 DIAGNOSIS — L719 Rosacea, unspecified: Secondary | ICD-10-CM

## 2017-05-07 DIAGNOSIS — Z79899 Other long term (current) drug therapy: Secondary | ICD-10-CM | POA: Diagnosis not present

## 2017-05-07 DIAGNOSIS — F909 Attention-deficit hyperactivity disorder, unspecified type: Secondary | ICD-10-CM | POA: Diagnosis not present

## 2017-05-07 MED ORDER — LISDEXAMFETAMINE DIMESYLATE 20 MG PO CAPS: 20.0000 mg | ORAL_CAPSULE | Freq: Every day | ORAL | 0 refills | Status: DC

## 2017-05-07 NOTE — Progress Notes (Signed)
Chief Complaint  Patient presents with  . Medication Management    Pt started taking Vyvanse x 2 weeks ago. Good tolerance. Pt noticed depression episodes about 1 week after starting taking the medication but this has resolved.     HPI: Terri [name] 52 y.o. come in for Chronic disease management  ADHD : med changes   Got ha with concerta        The vyvanse  Not  A HA  But had initial sadness    / getting better  Now has beenon about 2-3 weeks.  And seems to do ok sometinmes not sure how much helping but  Better to focus haven't noted  Much attention al  Issues .  ? If triggers rosacea.     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  Working 6 days oer week ft  Still some exercise help   ROS: See pertinent positives and negatives per HPI.  Past Medical History:  Diagnosis Date  . Anxiety   . Depression   . Environmental allergies   . GERD (gastroesophageal reflux disease)   . Headache(784.0)   . Recurrent sinusitis    ent and pulm eval in past.    Family History  Problem Relation Age of Onset  . Heart disease Unknown        fhx    Social History   Socioeconomic History  . Marital status: Single    Spouse name: Not on file  . Number of children: Not on file  . Years of education: Not on file  . Highest education level: Not on file  Occupational History  . Not on file  Social Needs  . Financial resource strain: Not on file  . Food insecurity:    Worry: Not on file    Inability: Not on file  . Transportation needs:    Medical: Not on file    Non-medical: Not on file  Tobacco Use  . Smoking status: Never Smoker  . Smokeless tobacco: Never Used  Substance and Sexual Activity  . Alcohol use: Yes    Comment: Occasional use  . Drug use: No  . Sexual activity: Yes    Birth control/protection: Pill  Lifestyle  . Physical activity:    Days per week: Not on file    Minutes per  session: Not on file  . Stress: Not on file  Relationships  . Social connections:    Talks on phone: Not on file    Gets together: Not on file    Attends religious service: Not on file    Active member of club or organization: Not on file    Attends meetings of clubs or organizations: Not on file    Relationship status: Not on file  Other Topics Concern  . 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 ALPRAZolam (XANAX) 0.25 MG tablet Take 1 tablet (0.25 mg total) by mouth 3 (three) times daily as needed for anxiety (panic). 20 tablet 0  . buPROPion (WELLBUTRIN XL) 150 MG 24 hr tablet Take 2 tablets (300 mg total) by mouth daily. 60 tablet 1  . CAMILA 0.35 MG tablet Take 1 tablet by mouth daily.  12  .  fish oil-omega-3 fatty acids 1000 MG capsule Take 1 g by mouth daily.      . fluticasone (FLONASE) 50 MCG/ACT nasal spray USE 2 SPRAYS INTO EACH NOSTRIL EVERY DAY 16 g 1  . gabapentin (NEURONTIN) 100 MG capsule TAKE 2 CAPSULES BY MOUTH AT BEDTIME 60 capsule 3  . LYSINE PO Take 1,000 mg by mouth daily. for fever blisters.    . Nutritional Supplements (DHEA PO) Take by mouth.    . SUMAtriptan (IMITREX) 100 MG tablet Take 1 at onset of  Migraine headache   and can repeat in 2 hours . 9 tablet 1  . traMADol (ULTRAM) 50 MG tablet Take 1 tablet (50 mg total) by mouth every 8 (eight) hours as needed. 30 tablet 0  . valACYclovir (VALTREX) 1000 MG tablet TAKE 1 TABLET (1,000 MG TOTAL) BY MOUTH 2 (TWO) TIMES DAILY. 30 tablet 1  . Vitamin D, Ergocalciferol, (DRISDOL) 50000 units CAPS capsule TAKE 1 CAPSULE EVERY 7 DAYS 12 capsule 0  . B Complex Vitamins (VITAMIN-B COMPLEX PO) Take by mouth.    . calcitonin, salmon, (MIACALCIN) 200 UNIT/ACT nasal spray Place 1 spray into alternate nostrils daily. (Patient not taking: Reported on 05/07/2017) 3.7 mL 11  . diazepam (VALIUM) 5 MG tablet One tab by  mouth, 2 hours before procedure. (Patient not taking: Reported on 05/07/2017) 2 tablet 0  . folic acid (FOLVITE) 1 MG tablet Take 1 mg by mouth daily.    . hydrOXYzine (ATARAX/VISTARIL) 10 MG tablet Take 1 tablet (10 mg total) by mouth 3 (three) times daily as needed. (Patient not taking: Reported on 05/07/2017) 30 tablet 0  . lisdexamfetamine (VYVANSE) 20 MG capsule Take 1 capsule (20 mg total) by mouth daily. 30 capsule 0  . meclizine (ANTIVERT) 25 MG tablet Take 1 tablet (25 mg total) by mouth 3 (three) times daily as needed for dizziness or nausea. (Patient not taking: Reported on 05/07/2017) 30 tablet 0  . methylphenidate (CONCERTA) 36 MG PO CR tablet Take 1 tablet (36 mg total) by mouth daily. (Patient not taking: Reported on 05/07/2017) 30 tablet 0  . methylphenidate (CONCERTA) 36 MG PO CR tablet Take 1 tablet (36 mg total) by mouth daily. (Patient not taking: Reported on 05/07/2017) 30 tablet 0  . norethindrone-ethinyl estradiol (JUNEL FE 1/20) 1-20 MG-MCG per tablet Take 1 tablet by mouth daily.      . predniSONE (DELTASONE) 50 MG tablet Take 1 tablet (50 mg total) by mouth daily. (Patient not taking: Reported on 05/07/2017) 5 tablet 0  . vitamin C (ASCORBIC ACID) 500 MG tablet Take 500 mg by mouth daily as needed.      No facility-administered medications prior to visit.      EXAM:  BP 104/62 (BP Location: Right Arm, Patient Position: Sitting, Cuff Size: Normal)   Pulse 89   Temp 98.4 F (36.9 C) (Oral)   Wt 117 lb 8 oz (53.3 kg)   BMI 20.17 kg/m   Body mass index is 20.17 kg/m.  GENERAL: vitals reviewed and listed above, alert, oriented, appears well hydrated and in no acute distress HEENT: atraumatic, conjunctiva  clear, no obvious abnormalities on inspection of external nose and ears MS: moves all extremities without noticeable focal  abnormality PSYCH: pleasant and cooperative, no obvious depression or anxiety talkative but no obv depression  Or panic  Skin  Faded erythema  cheeks   Lab Results  Component Value Date   GLUCOSE 103 (H) 07/12/2015   ALT 20 07/12/2015  AST 20 07/12/2015   NA 137 07/12/2015   K 3.7 07/12/2015   CL 105 07/12/2015   CREATININE 0.99 07/12/2015   BUN 14 07/12/2015   CO2 22 07/12/2015   BP Readings from Last 3 Encounters:  05/07/17 104/62  04/23/17 122/84  03/28/17 112/70    ASSESSMENT AND PLAN:  Discussed the following assessment and plan:  Attention deficit hyperactivity disorder (ADHD), unspecified ADHD type probable  Medication management - ? transient side effect .   Rosacea, unspecified  Risk benefit of medication discussed. Benefit more than risk of medications  to continue. Option to increae dose but  Seem to be stable at this time   Disc factor life stype concentration and  Function  She doesn't appear to be depressed today and seem to have had a short term mood  Sx with onset of med -Patient advised to return or notify health care team  if  new concerns arise. Total visit 25mins > 50% spent counseling and coordinating care as indicated in above note and in instructions to patient .     Patient Instructions  Ok to stay on same dose  .   consideration  Of increase dose in future but  Not now.   Self care.   Attention .  As discussed .   Advise dermatology fu.   Menopause can cause some mental  Slowness of processing but can be   better .[name].  Come back in 3 mos   Contact us for refills in interim.   Pharmacy request?       Neta MendsWanda K. Daivd Fredericksen M.D.

## 2017-05-07 NOTE — Patient Instructions (Addendum)
Ok to stay on same dose  .   consideration  Of increase dose in future but  Not now.   Self care.   Attention .  As discussed .   Advise dermatology fu.   Menopause can cause some mental  Slowness of processing but can be   better .[name].  Come back in 3 mos   Contact us for refills in interim.   Pharmacy request?

## 2017-05-10 ENCOUNTER — Other Ambulatory Visit: Payer: Self-pay | Admitting: Internal Medicine

## 2017-05-14 ENCOUNTER — Other Ambulatory Visit: Payer: Self-pay | Admitting: Internal Medicine

## 2017-05-15 ENCOUNTER — Telehealth: Payer: Self-pay | Admitting: Internal Medicine

## 2017-05-15 NOTE — Telephone Encounter (Signed)
Contact  patient and get more clinical information  About  Concerns  Side effects   So can be documented  In record  ? Rosacea other ?   Then we can can go back to prescribing the concerta 36 mg

## 2017-05-15 NOTE — Telephone Encounter (Signed)
Copied from CRM [phone]. Topic: Quick Communication - See Telephone Encounter >> May 15, 2017  9:41 AM Floria RavelingStovall, Shana A wrote: CRM for notification. See Telephone encounter for: 05/15/17 pt called in and stated that all the symptoms she was haven are worse on the lisdexamfetamine (VYVANSE) 20 MG capsule [[id number]][[id number]]  she wants to stop this med and go back to the Concerta 36 mg .  Pharmacy - [facility] on [address]

## 2017-05-15 NOTE — Telephone Encounter (Signed)
Please advise 

## 2017-05-15 NOTE — Telephone Encounter (Signed)
Routing...

## 2017-05-16 NOTE — Telephone Encounter (Signed)
Patient reports symptoms of the joints of  toes and  fingers elbows swelling. Pt reports  symptoms of  Moderate  pain in shoulders and hips Symptoms  Began when started  VYVANSE 3 weeks ago   -  Symptoms getting worse. Pt reports rash  is worse  Big  Red  Bumps on face. Pt states  It is  Rosacea.

## 2017-05-16 NOTE — Telephone Encounter (Signed)
Please advise 

## 2017-05-17 MED ORDER — METHYLPHENIDATE HCL ER (OSM) 36 MG PO TBCR: 36.0000 mg | EXTENDED_RELEASE_TABLET | Freq: Every day | ORAL | 0 refills | Status: DC

## 2017-05-17 NOTE — Telephone Encounter (Signed)
Left detailed message on verified machine making aware of recommendations.  Advised if any questions to call out office  Nothing further needed.

## 2017-05-17 NOTE — Telephone Encounter (Signed)
Not a typical  Side effect of this medication but we can change back to concerta  And I will send this in and  Then keep plan for fu in  June July   Sent in Rheemsconcerta generic  electronically

## 2017-05-20 NOTE — Progress Notes (Signed)
[name] [address] [place], [place] [address] Phone: [phone] Subjective:     CC: Back pain and left elbow pain follow-up  QVZ:DGLOVFIEPP  Terri [name] is a 52 y.o. female coming in with complaint of back pain.  Has been seen multiple times.  Has sacroiliac dysfunction.  Responds fairly well to manipulation.  Patient did respond well to injection as well.  Patient states worsening pain recently.  Notices that this is associated with some more joint swelling.  No positive ANA.  Patient was also found to have severe lateral epicondylitis with common tendon tearing noted.  Patient states about 2.5 weeks ago her joints started swelling. Her knuckles are swollen. Elbows. Says she feels heat in her joints. Hips and shoulders are the worse. Worse in the morning. Thinks it's a side effect of her ADHD meds. After a week of changing her meds the symptoms started.      Past Medical History:  Diagnosis Date  . Anxiety   . Depression   . Environmental allergies   . GERD (gastroesophageal reflux disease)   . Headache(784.0)   . Recurrent sinusitis    ent and pulm eval in past.   Past Surgical History:  Procedure Laterality Date  . HIP ARTHROSCOPY W/ LABRAL DEBRIDEMENT    . left hip surgery     [facility]   Social History   Socioeconomic History  . Marital status: Single    Spouse name: Not on file  . Number of children: Not on file  . Years of education: Not on file  . Highest education level: Not on file  Occupational History  . Not on file  Social Needs  . Financial resource strain: Not on file  . Food insecurity:    Worry: Not on file    Inability: Not on file  . Transportation needs:    Medical: Not on file    Non-medical: Not on file  Tobacco Use  . Smoking status: Never Smoker  . Smokeless tobacco: Never Used  Substance and Sexual Activity  . Alcohol use: Yes    Comment: Occasional use  . Drug use: No  . Sexual activity: Yes    Birth  control/protection: Pill  Lifestyle  . Physical activity:    Days per week: Not on file    Minutes per session: Not on file  . Stress: Not on file  Relationships  . Social connections:    Talks on phone: Not on file    Gets together: Not on file    Attends religious service: Not on file    Active member of club or organization: Not on file    Attends meetings of clubs or organizations: Not on file    Relationship status: Not on file  Other Topics Concern  . 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 Augmentin [Amoxicillin-Pot Clavulanate] Nausea Only  . Celecoxib     REACTION: unspecified  . Sulfonamide Derivatives     REACTION: hives, throat swelling  . Monistat [Miconazole] Other (See Comments)    Sensitive to topical vaginal  Azoles No side effects with oral Diflucan   Family History  Problem Relation Age of Onset  . Heart disease Unknown        fhx     Past medical history, social, surgical and family history all reviewed in electronic medical record.  No pertanent information unless stated regarding to the chief complaint.   Review of Systems:Review of systems updated and as accurate as of 05/21/17  No headache, visual changes, nausea, vomiting, diarrhea, constipation, dizziness, abdominal pain, skin rash, fevers, chills, night sweats, weight loss, swollen lymph nodes, body aches,  chest pain, shortness of breath, mood changes.  Positive muscle aches and joint swelling  Objective  Blood pressure 108/80, pulse 98, height  (1.626 m), weight 119 lb (54 kg), SpO2 98 %. Systems examined below as of 05/21/17   General: No apparent distress alert and oriented x3 mood and affect normal, dressed appropriately.  HEENT: Pupils equal, extraocular movements intact  Respiratory: Patient's speak in full sentences and does not appear short of breath  Cardiovascular: No lower extremity edema, non tender, no  erythema  Skin: Warm dry intact with no signs of infection or rash on extremities or on axial skeleton.   Abdomen: Soft nontender  Neuro: Cranial nerves II through XII are intact, neurovascularly intact in all extremities with 2+ DTRs and 2+ pulses.  Lymph: No lymphadenopathy of posterior or anterior cervical chain or axillae bilaterally.  Gait normal with good balance and coordination.  MSK: Diffuse tenderness to palpation of all joints today.  Patient does have swelling noted on the dorsal aspect of the hands at multiple joints.  Patient also has a very small amount of a rash over some of the dorsal aspect of joints today.  Back exam shows the patient does have diffusely tender to palpation in the thoracolumbar and lumbosacral junction bilaterally right greater than left.  Negative straight leg test.  Patient has significant decrease in range of motion of the back in all planes secondary to stiffness and pain.  Neurovascularly intact distally with 5 out of 5 strength.  Osteopathic findings C2 flexed rotated and side bent right C4 flexed rotated and side bent left C7 flexed rotated and side bent left T3 extended rotated and side bent right inhaled third rib T9 extended rotated and side bent left L2 flexed rotated and side bent right Sacrum right on right    Impression and Recommendations:     This case required medical decision making of moderate complexity.      Note: This dictation was prepared with Dragon dictation along with smaller phrase technology. Any transcriptional errors that result from this process are unintentional.

## 2017-05-21 ENCOUNTER — Encounter: Payer: Self-pay | Admitting: Family Medicine

## 2017-05-21 ENCOUNTER — Ambulatory Visit: Payer: BLUE CROSS/BLUE SHIELD | Admitting: Family Medicine

## 2017-05-21 VITALS — BP 108/80 | HR 98 | Ht 64.0 in | Wt 119.0 lb

## 2017-05-21 DIAGNOSIS — M999 Biomechanical lesion, unspecified: Secondary | ICD-10-CM | POA: Diagnosis not present

## 2017-05-21 DIAGNOSIS — M5416 Radiculopathy, lumbar region: Secondary | ICD-10-CM | POA: Diagnosis not present

## 2017-05-21 MED ORDER — PREDNISONE 50 MG PO TABS: 50.0000 mg | ORAL_TABLET | Freq: Every day | ORAL | 0 refills | Status: DC

## 2017-05-21 NOTE — Assessment & Plan Note (Signed)
Worsening tenderness and pain.  Attempted osteopathic manipulation.  I do feel that the positive ANA is likely contributing and likely having a flare.  We discussed the possibility of laboratory work-up with prednisone.  Patient has decided on the prednisone.  Has had history of compression fractures as well.  Will need to continue to monitor.  Follow-up again in 1 to 2 weeks

## 2017-05-21 NOTE — Assessment & Plan Note (Signed)
Decision today to treat with OMT was based on Physical Exam  After verbal consent patient was treated with HVLA, ME, FPR techniques in cervical, thoracic, rib, lumbar and sacral areas  Patient tolerated the procedure well with improvement in symptoms  Patient given exercises, stretches and lifestyle modifications  See medications in patient instructions if given  Patient will follow up in 2-4 weeks 

## 2017-05-21 NOTE — Patient Instructions (Signed)
I am sorry you are hurting bad I am concern you are having a flare Prednisone daily for 5 days.  Stop the vyvance and see as well.  Consider PRP can do the back and the elbow at the same time For the elbow wear the wrist brace at night Stay active See me again in 2-3 weeks Call sooner if you decide on the PRP

## 2017-05-24 DIAGNOSIS — M79652 Pain in left thigh: Secondary | ICD-10-CM | POA: Diagnosis not present

## 2017-05-24 DIAGNOSIS — M25552 Pain in left hip: Secondary | ICD-10-CM | POA: Diagnosis not present

## 2017-06-03 ENCOUNTER — Encounter: Payer: Self-pay | Admitting: Family Medicine

## 2017-06-07 DIAGNOSIS — R201 Hypoesthesia of skin: Secondary | ICD-10-CM | POA: Diagnosis not present

## 2017-06-07 DIAGNOSIS — M5442 Lumbago with sciatica, left side: Secondary | ICD-10-CM | POA: Diagnosis not present

## 2017-06-07 DIAGNOSIS — M7541 Impingement syndrome of right shoulder: Secondary | ICD-10-CM | POA: Diagnosis not present

## 2017-06-07 DIAGNOSIS — M7712 Lateral epicondylitis, left elbow: Secondary | ICD-10-CM | POA: Diagnosis not present

## 2017-06-11 DIAGNOSIS — L0201 Cutaneous abscess of face: Secondary | ICD-10-CM | POA: Diagnosis not present

## 2017-06-13 ENCOUNTER — Ambulatory Visit: Payer: BLUE CROSS/BLUE SHIELD | Admitting: Family Medicine

## 2017-06-13 ENCOUNTER — Encounter: Payer: Self-pay | Admitting: Family Medicine

## 2017-06-13 VITALS — BP 124/76 | HR 80 | Ht 64.0 in | Wt 119.0 lb

## 2017-06-13 DIAGNOSIS — M5416 Radiculopathy, lumbar region: Secondary | ICD-10-CM

## 2017-06-13 DIAGNOSIS — L989 Disorder of the skin and subcutaneous tissue, unspecified: Secondary | ICD-10-CM | POA: Diagnosis not present

## 2017-06-13 DIAGNOSIS — R201 Hypoesthesia of skin: Secondary | ICD-10-CM | POA: Diagnosis not present

## 2017-06-13 DIAGNOSIS — L503 Dermatographic urticaria: Secondary | ICD-10-CM | POA: Diagnosis not present

## 2017-06-13 DIAGNOSIS — R768 Other specified abnormal immunological findings in serum: Secondary | ICD-10-CM

## 2017-06-13 DIAGNOSIS — M999 Biomechanical lesion, unspecified: Secondary | ICD-10-CM | POA: Diagnosis not present

## 2017-06-13 DIAGNOSIS — M5442 Lumbago with sciatica, left side: Secondary | ICD-10-CM | POA: Diagnosis not present

## 2017-06-13 DIAGNOSIS — M7541 Impingement syndrome of right shoulder: Secondary | ICD-10-CM | POA: Diagnosis not present

## 2017-06-13 DIAGNOSIS — M7712 Lateral epicondylitis, left elbow: Secondary | ICD-10-CM | POA: Diagnosis not present

## 2017-06-13 MED ORDER — FLUCONAZOLE 150 MG PO TABS: ORAL_TABLET | ORAL | 0 refills | Status: DC

## 2017-06-13 NOTE — Patient Instructions (Signed)
Good to see you  We will get you in with dermatology and rheumatology  Ice is your friend.  Diflucan today and repeat in 4 days If things worsen we may need to consider prednsone daily for a week  See me again I n2-3 weeks

## 2017-06-13 NOTE — Progress Notes (Signed)
[name] [address] [place], [place] [address] Phone: [phone] Subjective:     CC: Right shoulder pain, back pain follow-up  ONG:EXBMWUXLKG  Terri [name] is a 52 y.o. female coming in with complaint of right shoulder pain. She did have some clicking but that clicking as turned into pain. She feels that her elbow is doing better that last visit. Still has pain with work. Has been wearing a counterforce strap when at work.  Patient states that overall seems to be doing a little bit better.  Has been doing ART for her back which she feels she has responded to as well.    Patient does have appointments on her face.  Patient was seen in urgent care.  Did not know what it is.  It seems to be worsening.  Feels like she needs a specialist to help.   Patient has had a positive ANA previously.  With a significantly high titer.  Patient has had flares with swelling of the joints that have been concerning.  Now more skin lesions recently.  Patient is willing to see rheumatology finally.      Past Medical History:  Diagnosis Date  . Anxiety   . Depression   . Environmental allergies   . GERD (gastroesophageal reflux disease)   . Headache(784.0)   . Recurrent sinusitis    ent and pulm eval in past.   Past Surgical History:  Procedure Laterality Date  . HIP ARTHROSCOPY W/ LABRAL DEBRIDEMENT    . left hip surgery     [facility]   Social History   Socioeconomic History  . Marital status: Single    Spouse name: Not on file  . Number of children: Not on file  . Years of education: Not on file  . Highest education level: Not on file  Occupational History  . Not on file  Social Needs  . Financial resource strain: Not on file  . Food insecurity:    Worry: Not on file    Inability: Not on file  . Transportation needs:    Medical: Not on file    Non-medical: Not on file  Tobacco Use  . Smoking status: Never Smoker  . Smokeless tobacco: Never Used    Substance and Sexual Activity  . Alcohol use: Yes    Comment: Occasional use  . Drug use: No  . Sexual activity: Yes    Birth control/protection: Pill  Lifestyle  . Physical activity:    Days per week: Not on file    Minutes per session: Not on file  . Stress: Not on file  Relationships  . Social connections:    Talks on phone: Not on file    Gets together: Not on file    Attends religious service: Not on file    Active member of club or organization: Not on file    Attends meetings of clubs or organizations: Not on file    Relationship status: Not on file  Other Topics Concern  . 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 Augmentin [Amoxicillin-Pot Clavulanate] Nausea Only  . Celecoxib     REACTION: unspecified  . Sulfonamide Derivatives     REACTION: hives, throat swelling  . Monistat [Miconazole] Other (See Comments)    Sensitive to topical vaginal  Azoles No side effects with oral Diflucan   Family History  Problem Relation Age of Onset  . Heart disease Unknown        fhx     Past medical history, social, surgical and family history all reviewed in electronic medical record.  No pertanent information unless stated regarding to the chief complaint.   Review of Systems:Review of systems updated and as accurate as of 06/13/17  No headache, visual changes, nausea, vomiting, diarrhea, constipation, dizziness, abdominal pain, skin rash, fevers, chills, night sweats, weight loss, swollen lymph nodes, , chest pain, shortness of breath, mood changes.  Positive body aches, joint swelling, muscle aches  Objective  Blood pressure 124/76, pulse 80, height  (1.626 m), weight 119 lb (54 kg), SpO2 96 %. Systems examined below as of 06/13/17   General: No apparent distress alert and oriented x3 mood and affect normal, dressed appropriately.  HEENT: Pupils equal, extraocular movements intact   Respiratory: Patient's speak in full sentences and does not appear short of breath  Cardiovascular: No lower extremity edema, non tender, no erythema  Skin: On the patient's face right cheek seems to be a small abscess formation noted.  Tender to palpation.  Patient does have redness across the cheeks as well as the forehead is concerning for discoid lupus rash. Abdomen: Soft nontender  Neuro: Cranial nerves II through XII are intact, neurovascularly intact in all extremities with 2+ DTRs and 2+ pulses.  Lymph: No lymphadenopathy of posterior or anterior cervical chain or axillae bilaterally.  Gait normal with good balance and coordination.  MSK:  \tender with full range of motion and good stability and symmetric strength and tone of shoulders, elbows, wrist, hip, knee and ankles bilaterally.   Back exam shows significant loss of lordosis.  Patient does have mild decrease in range of motion.  Patient had positive Faber and tightness bilaterally right greater than left.  Negative straight leg test.  Severe tenderness to palpation in the paraspinal musculature lumbar spine  Osteopathic findings C4 flexed rotated and side bent left C6 flexed rotated and side bent left T3 extended rotated and side bent right inhaled third rib L3 flexed rotated and side bent right Sacrum right on right     Impression and Recommendations:     This case required medical decision making of moderate complexity.      Note: This dictation was prepared with Dragon dictation along with smaller phrase technology. Any transcriptional errors that result from this process are unintentional.

## 2017-06-13 NOTE — Assessment & Plan Note (Signed)
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  Potentially could be more of an infectious etiology or potential bite.  Patient is already on clindamycin from an outside facility.  Discussed with patient in great length.  Patient will be referred to dermatology and because she is concerned of the long-term potential damage.

## 2017-06-13 NOTE — Assessment & Plan Note (Signed)
Decision today to treat with OMT was based on Physical Exam  After verbal consent patient was treated with HVLA, ME, FPR techniques in cervical, thoracic, lumbar and sacral areas  Patient tolerated the procedure well with improvement in symptoms  Patient given exercises, stretches and lifestyle modifications  See medications in patient instructions if given  Patient will follow up in 4 weeks 

## 2017-06-13 NOTE — Assessment & Plan Note (Signed)
Continues to have difficulty.  But I do believe that the underlying rheumatology pathology is likely contributing.  Discussed with patient in great length.  Patient is going to currently seeing rheumatology that I think will be beneficial as well.  We will continue home exercises.  Patient responded fairly well to manipulation.  Follow-up with me again in 4 weeks

## 2017-06-15 DIAGNOSIS — M7541 Impingement syndrome of right shoulder: Secondary | ICD-10-CM | POA: Diagnosis not present

## 2017-06-15 DIAGNOSIS — R201 Hypoesthesia of skin: Secondary | ICD-10-CM | POA: Diagnosis not present

## 2017-06-15 DIAGNOSIS — M7712 Lateral epicondylitis, left elbow: Secondary | ICD-10-CM | POA: Diagnosis not present

## 2017-06-15 DIAGNOSIS — M5442 Lumbago with sciatica, left side: Secondary | ICD-10-CM | POA: Diagnosis not present

## 2017-06-19 DIAGNOSIS — M5442 Lumbago with sciatica, left side: Secondary | ICD-10-CM | POA: Diagnosis not present

## 2017-06-19 DIAGNOSIS — R201 Hypoesthesia of skin: Secondary | ICD-10-CM | POA: Diagnosis not present

## 2017-06-19 DIAGNOSIS — M7712 Lateral epicondylitis, left elbow: Secondary | ICD-10-CM | POA: Diagnosis not present

## 2017-06-19 DIAGNOSIS — M7541 Impingement syndrome of right shoulder: Secondary | ICD-10-CM | POA: Diagnosis not present

## 2017-06-21 ENCOUNTER — Other Ambulatory Visit: Payer: Self-pay | Admitting: Internal Medicine

## 2017-06-21 NOTE — Telephone Encounter (Signed)
Refill request Concerta  LOV 05-07-2017 Dr Fabian Sharp  Last filled 05-17-2017  30 tabs  Qd NR  Pharmacy on File

## 2017-06-21 NOTE — Telephone Encounter (Signed)
Copied from CRM [id number]. Topic: Quick Communication - See Telephone Encounter >> Jun 21, 2017  8:59 AM Trula Slade wrote: CRM for notification. See Telephone encounter for: 06/21/17. Patient would like a refill on her methylphenidate (CONCERTA) 36 MG PO CR tablet medication and [facility] [address]. Patient has two more days of medication.

## 2017-06-22 DIAGNOSIS — L0291 Cutaneous abscess, unspecified: Secondary | ICD-10-CM | POA: Diagnosis not present

## 2017-06-22 DIAGNOSIS — L718 Other rosacea: Secondary | ICD-10-CM | POA: Diagnosis not present

## 2017-06-22 MED ORDER — METHYLPHENIDATE HCL ER (OSM) 36 MG PO TBCR: 36.0000 mg | EXTENDED_RELEASE_TABLET | Freq: Every day | ORAL | 0 refills | Status: DC

## 2017-06-22 NOTE — Telephone Encounter (Signed)
Please advise Dr Panosh, thanks.   

## 2017-06-25 NOTE — Telephone Encounter (Signed)
rx sent in 06/22/17 Pt notified.  Nothing further needed.

## 2017-06-26 NOTE — Progress Notes (Signed)
[facility] [address] [place], [place] [address] Phone: [phone] Subjective:     CC: Back pain and pain all over follow-up   ION:GEXBMWUXLKHPI:Subjective  Terri NewtonJulie [name] is a 52 y.o. female coming in with complaint of neck pain.  Continues to have rash on the face.  Has not made the appointment with rheumatology yet.  Patient is considering it.  Patient is having worsening swelling of the hands.       Past Medical History:  Diagnosis Date  . Anxiety   . Depression   . Environmental allergies   . GERD (gastroesophageal reflux disease)   . Headache(784.0)   . Recurrent sinusitis    ent and pulm eval in past.   Past Surgical History:  Procedure Laterality Date  . HIP ARTHROSCOPY W/ LABRAL DEBRIDEMENT    . left hip surgery     [facility]   Social History   Socioeconomic History  . Marital status: Single    Spouse name: Not on file  . Number of children: Not on file  . Years of education: Not on file  . Highest education level: Not on file  Occupational History  . Not on file  Social Needs  . Financial resource strain: Not on file  . Food insecurity:    Worry: Not on file    Inability: Not on file  . Transportation needs:    Medical: Not on file    Non-medical: Not on file  Tobacco Use  . Smoking status: Never Smoker  . Smokeless tobacco: Never Used  Substance and Sexual Activity  . Alcohol use: Yes    Comment: Occasional use  . Drug use: No  . Sexual activity: Yes    Birth control/protection: Pill  Lifestyle  . Physical activity:    Days per week: Not on file    Minutes per session: Not on file  . Stress: Not on file  Relationships  . Social connections:    Talks on phone: Not on file    Gets together: Not on file    Attends religious service: Not on file    Active member of club or organization: Not on file    Attends meetings of clubs or organizations: Not on file    Relationship status: Not on file  Other Topics Concern  . 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 Augmentin [Amoxicillin-Pot Clavulanate] Nausea Only  . Celecoxib     REACTION: unspecified  . Sulfonamide Derivatives     REACTION: hives, throat swelling  . Monistat [Miconazole] Other (See Comments)    Sensitive to topical vaginal  Azoles No side effects with oral Diflucan   Family History  Problem Relation Age of Onset  . Heart disease Unknown        fhx     Past medical history, social, surgical and family history all reviewed in electronic medical record.  No pertanent information unless stated regarding to the chief complaint.   Review of Systems:Review of systems updated and as accurate as of 06/28/17  No headache, visual changes, nausea, vomiting, diarrhea, constipation, dizziness, abdominal pain, skin rash, fevers, chills, night sweats, weight loss, swollen lymph nodes,  chest pain, shortness of breath, mood changes.  Positive muscle aches, body aches and joint swelling   Objective  Blood pressure 110/80, pulse 78, height 5\' 4"  (1.626 m), weight  116 lb (52.6 kg), SpO2 97 %. Systems examined below as of 06/28/17   General: No apparent distress alert and oriented x3 mood and affect normal, dressed appropriately.  HEENT: Pupils equal, extraocular movements intact  Respiratory: Patient's speak in full sentences and does not appear short of breath  Cardiovascular: No lower extremity edema, non tender, no erythema  Skin: Warm dry intact with no signs of infection or rash on extremities or on axial skeleton.  Abdomen: Soft nontender  Neuro: Cranial nerves II through XII are intact, neurovascularly intact in all extremities with 2+ DTRs and 2+ pulses.  Lymph: No lymphadenopathy of posterior or anterior cervical chain or axillae bilaterally.  Gait normal with good balance and coordination.  MSK:  tender with near full range of motion and good stability and symmetric  strength and tone of shoulders, elbows, wrist, hip, knee and ankles bilaterally.  Patient does have swelling noted of the hands and feet  Patient is low back shows some loss of lordosis.  Significant tightness bilaterally with Pearlean Brownie test.  Significant tightness of the hamstrings as well.  Mild crepitus with range of motion.  Osteopathic findings C2 flexed rotated and side bent right T3 extended rotated and side bent right inhaled third rib T6 extended rotated and side bent left L3 flexed rotated and side bent right Sacrum right on right         Impression and Recommendations:     This case required medical decision making of moderate complexity.      Note: This dictation was prepared with Dragon dictation along with smaller phrase technology. Any transcriptional errors that result from this process are unintentional.

## 2017-06-27 DIAGNOSIS — M7712 Lateral epicondylitis, left elbow: Secondary | ICD-10-CM | POA: Diagnosis not present

## 2017-06-27 DIAGNOSIS — R201 Hypoesthesia of skin: Secondary | ICD-10-CM | POA: Diagnosis not present

## 2017-06-27 DIAGNOSIS — M7541 Impingement syndrome of right shoulder: Secondary | ICD-10-CM | POA: Diagnosis not present

## 2017-06-27 DIAGNOSIS — M5442 Lumbago with sciatica, left side: Secondary | ICD-10-CM | POA: Diagnosis not present

## 2017-06-28 ENCOUNTER — Ambulatory Visit: Payer: BLUE CROSS/BLUE SHIELD | Admitting: Family Medicine

## 2017-06-28 ENCOUNTER — Encounter: Payer: Self-pay | Admitting: Family Medicine

## 2017-06-28 VITALS — BP 110/80 | HR 78 | Ht 64.0 in | Wt 116.0 lb

## 2017-06-28 DIAGNOSIS — R768 Other specified abnormal immunological findings in serum: Secondary | ICD-10-CM | POA: Diagnosis not present

## 2017-06-28 DIAGNOSIS — M999 Biomechanical lesion, unspecified: Secondary | ICD-10-CM

## 2017-06-28 NOTE — Patient Instructions (Signed)
Good to see you  Ice is your friend Call rheumatology please See me again in 4 weeks

## 2017-06-28 NOTE — Assessment & Plan Note (Signed)
Decision today to treat with OMT was based on Physical Exam  After verbal consent patient was treated with HVLA, ME, FPR techniques in cervical, thoracic, lumbar and sacral areas  Patient tolerated the procedure well with improvement in symptoms  Patient given exercises, stretches and lifestyle modifications  See medications in patient instructions if given  Patient will follow up in 4-8 weeks 

## 2017-06-28 NOTE — Assessment & Plan Note (Signed)
Significant concern for more of a vasculitis and likely lupus.  Patient encouraged to call rheumatology again.  Discussed icing regimen and home exercise.  Discussed which activities of doing which wants to avoid.  Follow-up again in 4 to 8 weeks

## 2017-07-04 DIAGNOSIS — L719 Rosacea, unspecified: Secondary | ICD-10-CM | POA: Diagnosis not present

## 2017-07-04 DIAGNOSIS — G8929 Other chronic pain: Secondary | ICD-10-CM | POA: Diagnosis not present

## 2017-07-04 DIAGNOSIS — M2550 Pain in unspecified joint: Secondary | ICD-10-CM | POA: Diagnosis not present

## 2017-07-04 DIAGNOSIS — M533 Sacrococcygeal disorders, not elsewhere classified: Secondary | ICD-10-CM | POA: Diagnosis not present

## 2017-07-05 DIAGNOSIS — M5442 Lumbago with sciatica, left side: Secondary | ICD-10-CM | POA: Diagnosis not present

## 2017-07-05 DIAGNOSIS — R201 Hypoesthesia of skin: Secondary | ICD-10-CM | POA: Diagnosis not present

## 2017-07-05 DIAGNOSIS — M7712 Lateral epicondylitis, left elbow: Secondary | ICD-10-CM | POA: Diagnosis not present

## 2017-07-05 DIAGNOSIS — M7541 Impingement syndrome of right shoulder: Secondary | ICD-10-CM | POA: Diagnosis not present

## 2017-07-07 ENCOUNTER — Other Ambulatory Visit: Payer: Self-pay | Admitting: Family Medicine

## 2017-07-07 ENCOUNTER — Other Ambulatory Visit: Payer: Self-pay | Admitting: Internal Medicine

## 2017-07-09 NOTE — Telephone Encounter (Signed)
Last OV on 05/07/2017 and last script wrote 05/10/2017.

## 2017-07-10 ENCOUNTER — Other Ambulatory Visit: Payer: Self-pay | Admitting: Internal Medicine

## 2017-07-10 DIAGNOSIS — M7541 Impingement syndrome of right shoulder: Secondary | ICD-10-CM | POA: Diagnosis not present

## 2017-07-10 DIAGNOSIS — M5442 Lumbago with sciatica, left side: Secondary | ICD-10-CM | POA: Diagnosis not present

## 2017-07-10 DIAGNOSIS — R201 Hypoesthesia of skin: Secondary | ICD-10-CM | POA: Diagnosis not present

## 2017-07-11 DIAGNOSIS — L700 Acne vulgaris: Secondary | ICD-10-CM | POA: Diagnosis not present

## 2017-07-17 DIAGNOSIS — M5442 Lumbago with sciatica, left side: Secondary | ICD-10-CM | POA: Diagnosis not present

## 2017-07-17 DIAGNOSIS — M7712 Lateral epicondylitis, left elbow: Secondary | ICD-10-CM | POA: Diagnosis not present

## 2017-07-17 DIAGNOSIS — M7541 Impingement syndrome of right shoulder: Secondary | ICD-10-CM | POA: Diagnosis not present

## 2017-07-23 ENCOUNTER — Other Ambulatory Visit: Payer: Self-pay | Admitting: Internal Medicine

## 2017-07-23 MED ORDER — METHYLPHENIDATE HCL ER (OSM) 36 MG PO TBCR: 36.0000 mg | EXTENDED_RELEASE_TABLET | Freq: Every day | ORAL | 0 refills | Status: DC

## 2017-07-23 NOTE — Telephone Encounter (Unsigned)
Copied from CRM [id number]. Topic: Quick Communication - Rx Refill/Question >> Jul 23, 2017  3:05 PM Gaynelle AduPoole, Shalonda wrote: Medication:methylphenidate (CONCERTA) 36 MG PO CR tablet   Preferred Pharmacy (with phone number or street name[facility] [address], [place], [id number] ph: [phone]  Agent: Please be advised that RX refills may take up to 3 business days. We ask that you follow-up with your pharmacy.

## 2017-07-23 NOTE — Telephone Encounter (Signed)
Sent in as directed.

## 2017-07-27 NOTE — Telephone Encounter (Signed)
Relation to pt: self  Call back number:[phone]   Reason for call:    Patient states the pharmacy does not have methylphenidate (CONCERTA) 36 MG PO CR tablet  in stock and was advised by pharmacy PCP has to cancel Rx with  [id number] [place] - [address] AT [facility] [phone] (Phone) [phone] (Fax)    Patient will be back in town on Monday and would like rx sent to  [id number] [place], [place] - [phone] (Phone) [phone] (Fax)

## 2017-07-30 MED ORDER — METHYLPHENIDATE HCL ER (OSM) 36 MG PO TBCR: 36.0000 mg | EXTENDED_RELEASE_TABLET | Freq: Every day | ORAL | 0 refills | Status: DC

## 2017-07-30 NOTE — Telephone Encounter (Signed)
Please cancel he one in [place] and will send local as  Requested

## 2017-07-30 NOTE — Telephone Encounter (Signed)
Pt is calling again for refill of medication. [place] does not have in stock. Needs sent to a different pharmacy  [id number] [place], [place] - [address] [facility] [address]

## 2017-07-30 NOTE — Telephone Encounter (Signed)
Patient is calling to check on the status of this [facility] [place]. The pharmacy did not have the medication and the patient is back in town/ please advise.   [id number] [place], [place] - [address] [facility] [address] [address] [phone] (Phone) [phone] (Fax)

## 2017-07-31 NOTE — Telephone Encounter (Signed)
[facility] and I left a voice message for pt with this informaiton.
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
## 2017-08-02 ENCOUNTER — Encounter: Payer: Self-pay | Admitting: Family Medicine

## 2017-08-02 ENCOUNTER — Ambulatory Visit: Payer: BLUE CROSS/BLUE SHIELD | Admitting: Family Medicine

## 2017-08-02 VITALS — BP 100/78 | HR 84 | Ht 64.0 in | Wt 121.0 lb

## 2017-08-02 DIAGNOSIS — G8929 Other chronic pain: Secondary | ICD-10-CM

## 2017-08-02 DIAGNOSIS — M25511 Pain in right shoulder: Secondary | ICD-10-CM | POA: Diagnosis not present

## 2017-08-02 DIAGNOSIS — M7551 Bursitis of right shoulder: Secondary | ICD-10-CM | POA: Insufficient documentation

## 2017-08-02 NOTE — Patient Instructions (Signed)
Good to see you  We injected the shoulder  Shoulder xray when you can  I really think it will help  See me again in 3-4 weeks

## 2017-08-02 NOTE — Assessment & Plan Note (Signed)
Patient was given injection.  Discussed icing regimen and home exercises.  Discussed which activities to do which wants to avoid.  Patient was to increase activity as tolerated.  Follow-up again in 4 to 8 weeks

## 2017-08-07 NOTE — Progress Notes (Signed)
Chief Complaint  Patient presents with  . Follow-up    medication    HPI: Terri [name] 52 y.o. come in for Chronic disease medication management  management    Changes back from vyvanse to concerta.   Cant tell if working.     Wellbutrin is helpful  At current dose and things are better    GBO gyne   NP   wen dover gyne . Had blood testing  Was on ocps   junel   and  changed to camilla  But  tiger red facial rash swollen   Had  swollen elbows and  Toes.     And went back to loestrin    10 10    Will be seeing  [employer].  Gyne in future   Eventually saw derm   Papular rosacea? Flare .   Had  3 urgent care  Visit and antibiotic.   Wound local  Tea tree oil and witch hazel.  And helped   After  Cleocin and  Keflex.   finally   Better      Dr Juanita Craver   .   Coupon sulantra .  ROS: See pertinent positives and negatives per HPI. 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  To see rheum soon    Past Medical History:  Diagnosis Date  . Anxiety   . Depression   . Environmental allergies   . GERD (gastroesophageal reflux disease)   . Headache(784.0)   . Recurrent sinusitis    ent and pulm eval in past.    Family History  Problem Relation Age of Onset  . Heart disease Unknown        fhx    Social History   Socioeconomic History  . Marital status: Single    Spouse name: Not on file  . Number of children: Not on file  . Years of education: Not on file  . Highest education level: Not on file  Occupational History  . Not on file  Social Needs  . Financial resource strain: Not on file  . Food insecurity:    Worry: Not on file    Inability: Not on file  . Transportation needs:    Medical: Not on file    Non-medical: Not on file  Tobacco Use  . Smoking status: Never Smoker  . Smokeless tobacco: Never Used  Substance and Sexual Activity  . Alcohol use: Yes    Comment: Occasional use  . Drug use: No  . Sexual activity:  Yes    Birth control/protection: Pill  Lifestyle  . Physical activity:    Days per week: Not on file    Minutes per session: Not on file  . Stress: Not on file  Relationships  . Social connections:    Talks on phone: Not on file    Gets together: Not on file    Attends religious service: Not on file    Active member of club or organization: Not on file    Attends meetings of clubs or organizations: Not on file    Relationship status: Not on file  Other Topics Concern  . 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 ALPRAZolam (XANAX) 0.25 MG tablet Take 1 tablet (0.25 mg total)  by mouth 3 (three) times daily as needed for anxiety (panic). 20 tablet 0  . fish oil-omega-3 fatty acids 1000 MG capsule Take 1 g by mouth daily.      . fluticasone (FLONASE) 50 MCG/ACT nasal spray SPRAY 2 SPRAYS INTO EACH NOSTRIL EVERY DAY 16 g 1  . gabapentin (NEURONTIN) 100 MG capsule TAKE 2 CAPSULES BY MOUTH AT BEDTIME 60 capsule 3  . LYSINE PO Take 1,000 mg by mouth daily. for fever blisters.    . methylphenidate (CONCERTA) 36 MG PO CR tablet Take 1 tablet (36 mg total) by mouth daily. 30 tablet 0  . Nutritional Supplements (DHEA PO) Take by mouth.    . SUMAtriptan (IMITREX) 100 MG tablet Take 1 at onset of  Migraine headache   and can repeat in 2 hours . 9 tablet 1  . valACYclovir (VALTREX) 1000 MG tablet TAKE 1 TABLET (1,000 MG TOTAL) BY MOUTH 2 (TWO) TIMES DAILY. 30 tablet 1  . buPROPion (WELLBUTRIN XL) 150 MG 24 hr tablet TAKE 2 TABLETS (300 MG TOTAL) BY MOUTH DAILY. 60 tablet 1  . fluconazole (DIFLUCAN) 150 MG tablet 1 pill today then repeat in 4 days (Patient not taking: Reported on 08/08/2017) 2 tablet 0  . traMADol (ULTRAM) 50 MG tablet Take 1 tablet (50 mg total) by mouth every 8 (eight) hours as needed. (Patient not taking: Reported on 08/08/2017) 30 tablet 0  . Vitamin D, Ergocalciferol,  (DRISDOL) 50000 units CAPS capsule TAKE 1 CAPSULE EVERY 7 DAYS (Patient not taking: Reported on 08/08/2017) 12 capsule 0  . CAMILA 0.35 MG tablet Take 1 tablet by mouth daily.  12  . lisdexamfetamine (VYVANSE) 20 MG capsule Take 1 capsule (20 mg total) by mouth daily. (Patient not taking: Reported on 08/08/2017) 30 capsule 0  . predniSONE (DELTASONE) 50 MG tablet Take 1 tablet (50 mg total) by mouth daily. (Patient not taking: Reported on 08/08/2017) 5 tablet 0   No facility-administered medications prior to visit.      EXAM:  BP 112/66   Pulse 79   Temp 98.4 F (36.9 C)   Wt 120 lb (54.4 kg)   BMI 20.60 kg/m   Body mass index is 20.6 kg/m.  GENERAL: vitals reviewed and listed above, alert, oriented, appears well hydrated and in no acute distress HEENT: atraumatic, conjunctiva  clear, no obvious abnormalities on inspection of external nose and ears OP : no lesion edema or exudate  NECK: no obvious masses on inspection palpation  LUNGS: clear to auscultation bilaterally, no wheezes, rales or rhonchi, good air movement CV: HRRR, no clubbing cyanosis or  peripheral edema nl cap refill  Abdomen:  Sof,t normal bowel sounds without hepatosplenomegaly, no guarding rebound or masses no CVA tenderness Skin  Make up of cheeks scaring abated  pxi fo very large abscess on right cheeck MS: moves all extremities without noticeable focal  abnormality PSYCH: pleasant and cooperative, no obvious depression or anxiety  BP Readings from Last 3 Encounters:  08/08/17 112/66  08/02/17 100/78  06/28/17 110/80    ASSESSMENT AND PLAN:  Discussed the following assessment and plan:  Attention deficit hyperactivity disorder (ADHD), unspecified ADHD type - contine same med   consdier dec dose in future  to 27   Medication management  Anxiety and depression - IMPROVED STAY ON  ON wellbutrin 300   xr q d   Acne rosacea, papular type - scarring  severe episode  derm advised low dose accutane disc    Perimenopausal Current  stable and maintain medication      Seeing her specialists   contact for refills    ROV in 6 months or as needed  And keep [place] informed  -Patient advised to return or notify health care team  if  new concerns arise.  Patient Instructions  Same medication for now   Consideration in future  .     Of decreasing      concerta to 27 mg .  Not due yet.,.    Will send in  wellbutrin 300 mg  xl  Every day and then  Refills  .   Can send in a requests for.  refill  By my chart  .            [name]. Loukas Antonson M.D.

## 2017-08-08 ENCOUNTER — Encounter: Payer: Self-pay | Admitting: Internal Medicine

## 2017-08-08 ENCOUNTER — Ambulatory Visit: Payer: BLUE CROSS/BLUE SHIELD | Admitting: Internal Medicine

## 2017-08-08 VITALS — BP 112/66 | HR 79 | Temp 98.4°F | Wt 120.0 lb

## 2017-08-08 DIAGNOSIS — F419 Anxiety disorder, unspecified: Secondary | ICD-10-CM | POA: Diagnosis not present

## 2017-08-08 DIAGNOSIS — Z79899 Other long term (current) drug therapy: Secondary | ICD-10-CM

## 2017-08-08 DIAGNOSIS — F909 Attention-deficit hyperactivity disorder, unspecified type: Secondary | ICD-10-CM | POA: Diagnosis not present

## 2017-08-08 DIAGNOSIS — L718 Other rosacea: Secondary | ICD-10-CM | POA: Diagnosis not present

## 2017-08-08 DIAGNOSIS — N951 Menopausal and female climacteric states: Secondary | ICD-10-CM

## 2017-08-08 DIAGNOSIS — F32A Depression, unspecified: Secondary | ICD-10-CM

## 2017-08-08 DIAGNOSIS — F329 Major depressive disorder, single episode, unspecified: Secondary | ICD-10-CM

## 2017-08-08 MED ORDER — BUPROPION HCL ER (XL) 300 MG PO TB24: 300.0000 mg | ORAL_TABLET | Freq: Every day | ORAL | 6 refills | Status: DC

## 2017-08-08 NOTE — Patient Instructions (Addendum)
Same medication for now   Consideration in future  .     Of decreasing      concerta to 27 mg .  Not due yet.,.    Will send in  wellbutrin 300 mg  xl  Every day and then  Refills  .   Can send in a requests for.  refill  By my chart  .

## 2017-08-10 DIAGNOSIS — L718 Other rosacea: Secondary | ICD-10-CM | POA: Diagnosis not present

## 2017-08-16 DIAGNOSIS — M150 Primary generalized (osteo)arthritis: Secondary | ICD-10-CM | POA: Diagnosis not present

## 2017-08-16 DIAGNOSIS — N951 Menopausal and female climacteric states: Secondary | ICD-10-CM | POA: Diagnosis not present

## 2017-08-16 DIAGNOSIS — M2550 Pain in unspecified joint: Secondary | ICD-10-CM | POA: Diagnosis not present

## 2017-08-23 ENCOUNTER — Ambulatory Visit (INDEPENDENT_AMBULATORY_CARE_PROVIDER_SITE_OTHER)
Admission: RE | Admit: 2017-08-23 | Discharge: 2017-08-23 | Disposition: A | Discharge status: Home / Self Care | Payer: BLUE CROSS/BLUE SHIELD | Source: Ambulatory Visit | Attending: Family Medicine | Admitting: Family Medicine

## 2017-08-23 DIAGNOSIS — M25511 Pain in right shoulder: Secondary | ICD-10-CM

## 2017-08-23 DIAGNOSIS — G8929 Other chronic pain: Secondary | ICD-10-CM | POA: Diagnosis not present

## 2017-08-24 ENCOUNTER — Encounter: Payer: Self-pay | Admitting: Family Medicine

## 2017-08-24 ENCOUNTER — Ambulatory Visit: Payer: BLUE CROSS/BLUE SHIELD | Admitting: Family Medicine

## 2017-08-24 VITALS — BP 106/72 | HR 93 | Ht 64.0 in | Wt 120.0 lb

## 2017-08-24 DIAGNOSIS — M999 Biomechanical lesion, unspecified: Secondary | ICD-10-CM

## 2017-08-24 DIAGNOSIS — M357 Hypermobility syndrome: Secondary | ICD-10-CM | POA: Diagnosis not present

## 2017-08-24 NOTE — Progress Notes (Signed)
Tawana ScaleZach [name] D.O. [facility] [address] [place], [place] [address] Phone: [phone] Subjective:     CC: Shoulder pain follow-up  XLK:[id number]:Subjective  Terri NewtonJulie [name] is a 52 y.o. female coming in with complaint of shoulder pain. States her shoulder is better than last visit. Had a massage recently that made it a little better. On new medication that caused her to pass out and makes her feel light headed. Has been on the medication for 2 weeks.   Patient is also had polyarthralgia.  Concern for possible underlying autoimmune disease.  Awaiting other labs sent rheumatology at the moment.  Patient still has some discomfort and pain in the thoracic spine.     Past Medical History:  Diagnosis Date  . Anxiety   . Depression   . Environmental allergies   . GERD (gastroesophageal reflux disease)   . Headache(784.0)   . Recurrent sinusitis    ent and pulm eval in past.   Past Surgical History:  Procedure Laterality Date  . HIP ARTHROSCOPY W/ LABRAL DEBRIDEMENT    . left hip surgery     [facility]   Social History   Socioeconomic History  . Marital status: Single    Spouse name: Not on file  . Number of children: Not on file  . Years of education: Not on file  . Highest education level: Not on file  Occupational History  . Not on file  Social Needs  . Financial resource strain: Not on file  . Food insecurity:    Worry: Not on file    Inability: Not on file  . Transportation needs:    Medical: Not on file    Non-medical: Not on file  Tobacco Use  . Smoking status: Never Smoker  . Smokeless tobacco: Never Used  Substance and Sexual Activity  . Alcohol use: Yes    Comment: Occasional use  . Drug use: No  . Sexual activity: Yes    Birth control/protection: Pill  Lifestyle  . Physical activity:    Days per week: Not on file    Minutes per session: Not on file  . Stress: Not on file  Relationships  . Social connections:    Talks on phone: Not on file    Gets  together: Not on file    Attends religious service: Not on file    Active member of club or organization: Not on file    Attends meetings of clubs or organizations: Not on file    Relationship status: Not on file  Other Topics Concern  . 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 Augmentin [Amoxicillin-Pot Clavulanate] Nausea Only  . Celecoxib     REACTION: unspecified  . Sulfonamide Derivatives     REACTION: hives, throat swelling  . Monistat [Miconazole] Other (See Comments)    Sensitive to topical vaginal  Azoles No side effects with oral Diflucan   Family History  Problem Relation Age of Onset  . Heart disease Unknown        fhx     Past medical history, social, surgical and family history all reviewed in electronic medical record.  No pertanent information unless stated regarding to the chief complaint.   Review of Systems:Review of systems updated and as accurate as of 08/24/17  No headache, visual changes, nausea, vomiting, diarrhea, constipation, dizziness, abdominal pain, skin rash, fevers, chills, night  sweats, weight loss, swollen lymph nodes, body aches, joint swelling, muscle aches, chest pain, shortness of breath, mood changes.   Objective  Blood pressure 106/72, pulse 93, height 5\' 4"  (1.626 m), weight 120 lb (54.4 kg), SpO2 98 %. Systems examined below as of 08/24/17   General: No apparent distress alert and oriented x3 mood and affect normal, dressed appropriately.  HEENT: Pupils equal, extraocular movements intact  Respiratory: Patient's speak in full sentences and does not appear short of breath  Cardiovascular: No lower extremity edema, non tender, no erythema  Skin: Warm dry intact with no signs of infection or rash on extremities or on axial skeleton.  Rash on patient's face is improved Abdomen: Soft nontender  Neuro: Cranial nerves II through XII are intact, neurovascularly intact in  all extremities with 2+ DTRs and 2+ pulses.  Lymph: No lymphadenopathy of posterior or anterior cervical chain or axillae bilaterally.  Gait normal with good balance and coordination.  MSK:  Non tender with full range of motion and good stability and symmetric strength and tone of shoulders, elbows, wrist, hip, knee and ankles bilaterally.  Mild hypermobility of multiple joints   Neck exam shows some mild tightness with side bending bilaterally.  Some tightness of the trapezius muscles bilaterally.  Negative Spurling's test today.  Low back shows positive Pearlean Brownie test on the right side.  Tenderness to palpation over the right sacrum.  Negative straight leg test.  4+ out of 5 strength in the lower extremities.  Osteopathic findings C2 flexed rotated and side bent right T3 extended rotated and side bent right inhaled third rib T6 extended rotated and side bent left L3 flexed rotated and side bent right Sacrum right on right  Pelvic shear noted.      Impression and Recommendations:     This case required medical decision making of moderate complexity.      Note: This dictation was prepared with Dragon dictation along with smaller phrase technology. Any transcriptional errors that result from this process are unintentional.

## 2017-08-24 NOTE — Assessment & Plan Note (Signed)
Decision today to treat with OMT was based on Physical Exam  After verbal consent patient was treated with HVLA, ME, FPR techniques in cervical, thoracic, rib, lumbar and sacral areas  Patient tolerated the procedure well with improvement in symptoms  Patient given exercises, stretches and lifestyle modifications  See medications in patient instructions if given  Patient will follow up in 4-6 weeks 

## 2017-08-24 NOTE — Assessment & Plan Note (Signed)
Underlying difficulty.  Patient does have hypermobility.  Still being worked up for the possible autoimmune and lupus.  Responded well to us by the manipulation.  Patient shoulder is unremarkable today.  Discussed posture and ergonomics.  Follow-up again in 6 weeks

## 2017-08-24 NOTE — Patient Instructions (Signed)
6 weeks

## 2017-08-28 ENCOUNTER — Telehealth: Payer: Self-pay | Admitting: Internal Medicine

## 2017-08-28 MED ORDER — METHYLPHENIDATE HCL ER (OSM) 36 MG PO TBCR: 36.0000 mg | EXTENDED_RELEASE_TABLET | Freq: Every day | ORAL | 0 refills | Status: DC

## 2017-08-28 NOTE — Telephone Encounter (Signed)
Sent in electronically .  

## 2017-08-28 NOTE — Telephone Encounter (Signed)
Copied from CRM [id number]. Topic: Quick Communication - Rx Refill/Question >> Aug 28, 2017 11:31 AM Crist InfanteHarrald, Kathy J wrote: Medication: methylphenidate (CONCERTA) 36 MG PO CR tablet  [id number] - [place], [place] - [address] [facility] [address] [phone] (Phone) [phone] (Fax)

## 2017-08-28 NOTE — Telephone Encounter (Signed)
Last filled 07/30/17 #38, x0rf  Please advise Dr Fabian SharpPanosh, thanks.

## 2017-08-29 NOTE — Telephone Encounter (Signed)
Pt aware via voicemail that her Rx has been sent.  Nothing further needed.

## 2017-09-06 ENCOUNTER — Other Ambulatory Visit: Payer: Self-pay | Admitting: Internal Medicine

## 2017-09-25 ENCOUNTER — Telehealth: Payer: Self-pay | Admitting: Internal Medicine

## 2017-09-25 ENCOUNTER — Other Ambulatory Visit: Payer: Self-pay | Admitting: Internal Medicine

## 2017-09-25 NOTE — Telephone Encounter (Unsigned)
Copied from CRM #[id number]. Topic: Quick Communication - Rx Refill/Question °>> Sep 25, 2017  4:54 PM Mcneil, Ja-Kwan wrote: °Medication: methylphenidate (CONCERTA) 36 MG PO CR tablet ° °Has the patient contacted their pharmacy? no ° °Preferred Pharmacy (with phone number or street name): [id number] - [place], [place] - [address] AT [address] [phone] (Phone) °[phone] (Fax) ° °Agent: Please be advised that RX refills may take up to 3 business days. We ask that you follow-up with your pharmacy. °

## 2017-09-25 NOTE — Telephone Encounter (Signed)
Refill of Concerta  LOV 08/08/17 Dr. Fabian Sharp  LRF 08/28/17  #30  0 refills  [id number] - [place], [place] - [address] AT [facility] [address] [facility] [address]          [phone] (Phone) [phone] (Fax)

## 2017-09-25 NOTE — Telephone Encounter (Unsigned)
Copied from CRM [id number]. Topic: Quick Communication - Rx Refill/Question >> Sep 25, 2017  4:54 PM Mcneil, Ja-Kwan wrote: Medication: methylphenidate (CONCERTA) 36 MG PO CR tablet  Has the patient contacted their pharmacy? no  Preferred Pharmacy (with phone number or street name): [id number] - [place], [place] - [address] AT [address] [facility] [address] [phone] (Phone) [phone] (Fax)  Agent: Please be advised that RX refills may take up to 3 business days. We ask that you follow-up with your pharmacy.

## 2017-09-27 NOTE — Telephone Encounter (Signed)
Please advise Dr Panosh, thanks.   

## 2017-09-28 MED ORDER — METHYLPHENIDATE HCL ER (OSM) 36 MG PO TBCR: 36.0000 mg | EXTENDED_RELEASE_TABLET | Freq: Every day | ORAL | 0 refills | Status: DC

## 2017-09-28 NOTE — Telephone Encounter (Signed)
Sent in electronically .  

## 2017-10-02 NOTE — Progress Notes (Signed)
[name] [address] [place], [place] [address] Phone: [phone] Subjective:   [name], am serving as a scribe for Dr. Antoine Primas.  CC: Back pain follow-up  [id number]  Terri [name] is a 52 y.o. female coming in with complaint of back pain. Patient was seeing a chiropractor for left SI joint pain. Patient said practitioner was aggressive with the joint trying to get it to loosen. Is having pain with rotation both directions. Has been having acupuncture and was told that she may have a rib out on the left side. Pain has been on going for 1 month.   Patient also notes a neuroma on the right foot. Is having sharp pain. Swelling over 3rd and 4th metatarsal head. Painful with weight bearing. Toes are abducting from one another due to pain she states.  Has responded fairly well to manipulation in the past.  More compliant rheumatologist patient states that showed negative results.      Past Medical History:  Diagnosis Date  . Anxiety   . Depression   . Environmental allergies   . GERD (gastroesophageal reflux disease)   . Headache(784.0)   . Recurrent sinusitis    ent and pulm eval in past.   Past Surgical History:  Procedure Laterality Date  . HIP ARTHROSCOPY W/ LABRAL DEBRIDEMENT    . left hip surgery     [facility]   Social History   Socioeconomic History  . Marital status: Single    Spouse name: Not on file  . Number of children: Not on file  . Years of education: Not on file  . Highest education level: Not on file  Occupational History  . Not on file  Social Needs  . Financial resource strain: Not on file  . Food insecurity:    Worry: Not on file    Inability: Not on file  . Transportation needs:    Medical: Not on file    Non-medical: Not on file  Tobacco Use  . Smoking status: Never Smoker  . Smokeless tobacco: Never Used  Substance and Sexual Activity  . Alcohol use: Yes    Comment: Occasional use  . Drug use:  No  . Sexual activity: Yes    Birth control/protection: Pill  Lifestyle  . Physical activity:    Days per week: Not on file    Minutes per session: Not on file  . Stress: Not on file  Relationships  . Social connections:    Talks on phone: Not on file    Gets together: Not on file    Attends religious service: Not on file    Active member of club or organization: Not on file    Attends meetings of clubs or organizations: Not on file    Relationship status: Not on file  Other Topics Concern  . 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 Augmentin [Amoxicillin-Pot Clavulanate] Nausea Only  . Celecoxib     REACTION: unspecified  . Sulfonamide Derivatives     REACTION: hives, throat swelling  . Monistat [Miconazole] Other (See Comments)    Sensitive to topical vaginal  Azoles No side effects with oral Diflucan   Family History  Problem Relation Age of Onset  . Heart disease Unknown        fhx    Current Outpatient Medications (Endocrine & Metabolic):  .  Norethin-Eth Estrad-Fe Biphas (LO LOESTRIN FE PO), Take by mouth.  Current Outpatient Medications (Cardiovascular):  .  spironolactone (ALDACTONE) 25 MG tablet, Take 25 mg by mouth daily.  Current Outpatient Medications (Respiratory):  .  fluticasone (FLONASE) 50 MCG/ACT nasal spray, SPRAY 2 SPRAYS INTO EACH NOSTRIL EVERY DAY  Current Outpatient Medications (Analgesics):  [name]  SUMAtriptan (IMITREX) 100 MG tablet, Take 1 at onset of  Migraine headache   and can repeat in 2 hours . .  traMADol (ULTRAM) 50 MG tablet, Take 1 tablet (50 mg total) by mouth every 8 (eight) hours as needed.   Current Outpatient Medications (Other):  [name]  ALPRAZolam (XANAX) 0.25 MG tablet, Take 1 tablet (0.25 mg total) by mouth 3 (three) times daily as needed for anxiety (panic). [name]  buPROPion (WELLBUTRIN XL) 300 MG 24 hr tablet, Take 1 tablet (300 mg total) by mouth daily. .   fish oil-omega-3 fatty acids 1000 MG capsule, Take 1 g by mouth daily.   .  fluconazole (DIFLUCAN) 150 MG tablet, 1 pill today then repeat in 4 days .  gabapentin (NEURONTIN) 100 MG capsule, TAKE 2 CAPSULES BY MOUTH AT BEDTIME .  LYSINE PO, Take 1,000 mg by mouth daily. for fever blisters. .  methylphenidate (CONCERTA) 36 MG PO CR tablet, Take 1 tablet (36 mg total) by mouth daily. .  Nutritional Supplements (DHEA PO)*, Take by mouth. .  valACYclovir (VALTREX) 1000 MG tablet, TAKE 1 TABLET (1,000 MG TOTAL) BY MOUTH 2 (TWO) TIMES DAILY. [name]  Vitamin D, Ergocalciferol, (DRISDOL) 50000 units CAPS capsule, TAKE 1 CAPSULE EVERY 7 DAYS * These medications belong to multiple therapeutic classes and are listed under each applicable group.    Past medical history, social, surgical and family history all reviewed in electronic medical record.  No pertanent information unless stated regarding to the chief complaint.   Review of Systems:  No headache, visual changes, nausea, vomiting, diarrhea, constipation, dizziness, abdominal pain, skin rash, fevers, chills, night sweats, weight loss, swollen lymph nodes, body aches,  chest pain, shortness of breath, mood changes.  Positive muscle aches and joint swelling  Objective  Blood pressure 104/80, pulse 95, height 5\' 4"  (1.626 m), weight 119 lb (54 kg), SpO2 98 %.   General: No apparent distress alert and oriented x3 mood and affect normal, dressed appropriately.  HEENT: Pupils equal, extraocular movements intact  Respiratory: Patient's speak in full sentences and does not appear short of breath  Cardiovascular: No lower extremity edema, non tender, no erythema  Skin: Warm dry intact with no signs of infection or rash on extremities or on axial skeleton.  Abdomen: Soft nontender  Neuro: Cranial nerves II through XII are intact, neurovascularly intact in all extremities with 2+ DTRs and 2+ pulses.  Lymph: No lymphadenopathy of posterior or anterior cervical  chain or axillae bilaterally.  Gait normal with good balance and coordination.  MSK:  Non tender with full range of motion and good stability and symmetric strength and tone of shoulders, elbows, wrist, hip, knee and ankles bilaterally.  Mild hypermobility of multiple joints  Back Exam:  Inspection: Mild loss of lordosis Motion: Flexion 45 deg, Extension 35 deg, Side Bending to 35 deg bilaterally,  Rotation to 45 deg bilaterally  SLR laying: Negative  XSLR laying: Negative  Palpable tenderness: Tender to palpation the paraspinal musculature lumbar spine right greater than left. FABER: Positive right. Sensory change: Gross sensation intact to all lumbar and sacral dermatomes.  Reflexes: 2+ at both patellar tendons, 2+  at achilles tendons, Babinski's downgoing.  Strength at foot  Plantar-flexion: 5/5 Dorsi-flexion: 5/5 Eversion: 5/5 Inversion: 5/5  Leg strength  Quad: 5/5 Hamstring: 5/5 Hip flexor: 5/5 Hip abductors: 4/5  Gait unremarkable.  Osteopathic findings Cervical C2 flexed rotated and side bent right C6 flexed rotated and side bent right T3 extended rotated and side bent right inhaled third rib T9 extended rotated and side bent left L2 flexed rotated and side bent right Sacrum right on right Right anterior ilium    Impression and Recommendations:     This case required medical decision making of moderate complexity. The above documentation has been reviewed and is accurate and complete Judi Saa, DO       Note: This dictation was prepared with Dragon dictation along with smaller phrase technology. Any transcriptional errors that result from this process are unintentional.

## 2017-10-03 ENCOUNTER — Ambulatory Visit: Payer: BLUE CROSS/BLUE SHIELD | Admitting: Family Medicine

## 2017-10-03 ENCOUNTER — Encounter: Payer: Self-pay | Admitting: Family Medicine

## 2017-10-03 VITALS — BP 104/80 | HR 95 | Ht 64.0 in | Wt 119.0 lb

## 2017-10-03 DIAGNOSIS — R768 Other specified abnormal immunological findings in serum: Secondary | ICD-10-CM | POA: Diagnosis not present

## 2017-10-03 DIAGNOSIS — M999 Biomechanical lesion, unspecified: Secondary | ICD-10-CM

## 2017-10-03 DIAGNOSIS — M940 Chondrocostal junction syndrome [Tietze]: Secondary | ICD-10-CM | POA: Diagnosis not present

## 2017-10-03 DIAGNOSIS — M542 Cervicalgia: Secondary | ICD-10-CM

## 2017-10-03 NOTE — Assessment & Plan Note (Signed)
Stable.  Does have some mild underlying arthritis.  I do think the autoimmune is playing a role.  Follow-up again in 4 to 8 weeks

## 2017-10-03 NOTE — Assessment & Plan Note (Addendum)
Decision today to treat with OMT was based on Physical Exam  After verbal consent patient was treated with HVLA, ME, FPR techniques in cervical, thoracic, rib  lumbar and sacral and pelvis areas  Patient tolerated the procedure well with improvement in symptoms  Patient given exercises, stretches and lifestyle modifications  See medications in patient instructions if given  Patient will follow up in 4-8 weeks

## 2017-10-03 NOTE — Assessment & Plan Note (Signed)
Significant tender.  Discussed with patient in great length.  I do believe that patient likely further work-up from the autoimmune at some point.  We will hold on any medications.  Patient seems to be doing better at the moment with this and acupuncture.  Responds well to osteopathic manipulation.  Discussed posture and ergonomics in her work setting.  Follow-up with me again in 4 to 8 weeks

## 2017-10-03 NOTE — Patient Instructions (Signed)
GOOD TO SEE YOU  Ice is your friend Stay active Yoga and acupuncture is great See em again in 3-4 weeks

## 2017-10-03 NOTE — Assessment & Plan Note (Signed)
Stable.  Hypermobility.  Responds well to manipulation.  We will continue to monitor.  Follow-up again in 4 to 8 weeks

## 2017-10-11 ENCOUNTER — Telehealth: Payer: Self-pay

## 2017-10-11 NOTE — Telephone Encounter (Signed)
Spoke with patient who said that she bought the [employer] and her feet feel worse. Did go back to her old orthotics but is wondering if there is another option. Is also getting an anti fatigue mat and is in the process of getting new shoes for work. Told patient we will call her back with next step.

## 2017-10-11 NOTE — Telephone Encounter (Signed)
Copied from CRM #[id number]. Topic: Inquiry >> Oct 11, 2017  1:27 PM Richardson, Taren N, NT wrote: Reason for CRM: Pt states that last week Ferrell Flam wrote on a piece of paper what orthotic she should get and she states that the one she got does not fit the way Dr. Smith said it would and she wants to be sure she got the right thing. Please advise. 

## 2017-10-11 NOTE — Telephone Encounter (Unsigned)
Copied from CRM [id number]. Topic: Inquiry >> Oct 11, 2017  1:27 PM Arlyss Gandyichardson, Taren N, NT wrote: Reason for CRM: Pt states that last week Vikki PortsValerie wrote on a piece of paper what orthotic she should get and she states that the one she got does not fit the way Dr. Katrinka BlazingSmith said it would and she wants to be sure she got the right thing. Please advise.

## 2017-10-24 DIAGNOSIS — N951 Menopausal and female climacteric states: Secondary | ICD-10-CM | POA: Diagnosis not present

## 2017-10-24 NOTE — Progress Notes (Signed)
[name] [address] [place], [place] [address] Phone: [phone] Subjective:    I [name] am serving as a Neurosurgeon for Dr. Antoine Primas.   CC: Hip pain  [id number]  Terri [name] is a 52 y.o. female coming in with complaint of hip pain. Here for adjustments. Wants to get fitted for orthotics.  Patient states that the pain is a little better.  Has responded fairly well to manipulation.  Still having some aches and pains on a daily basis though.  Patient has been doing the exercises intermittently.  Tries to workout on a fairly regular basis as well.     Past Medical History:  Diagnosis Date  . Anxiety   . Depression   . Environmental allergies   . GERD (gastroesophageal reflux disease)   . Headache(784.0)   . Recurrent sinusitis    ent and pulm eval in past.   Past Surgical History:  Procedure Laterality Date  . HIP ARTHROSCOPY W/ LABRAL DEBRIDEMENT    . left hip surgery     [facility]   Social History   Socioeconomic History  . Marital status: Single    Spouse name: Not on file  . Number of children: Not on file  . Years of education: Not on file  . Highest education level: Not on file  Occupational History  . Not on file  Social Needs  . Financial resource strain: Not on file  . Food insecurity:    Worry: Not on file    Inability: Not on file  . Transportation needs:    Medical: Not on file    Non-medical: Not on file  Tobacco Use  . Smoking status: Never Smoker  . Smokeless tobacco: Never Used  Substance and Sexual Activity  . Alcohol use: Yes    Comment: Occasional use  . Drug use: No  . Sexual activity: Yes    Birth control/protection: Pill  Lifestyle  . Physical activity:    Days per week: Not on file    Minutes per session: Not on file  . Stress: Not on file  Relationships  . Social connections:    Talks on phone: Not on file    Gets together: Not on file    Attends religious service: Not on file   Active member of club or organization: Not on file    Attends meetings of clubs or organizations: Not on file    Relationship status: Not on file  Other Topics Concern  . 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 Augmentin [Amoxicillin-Pot Clavulanate] Nausea Only  . Celecoxib     REACTION: unspecified  . Sulfonamide Derivatives     REACTION: hives, throat swelling  . Monistat [Miconazole] Other (See Comments)    Sensitive to topical vaginal  Azoles No side effects with oral Diflucan   Family History  Problem Relation Age of Onset  . Heart disease Unknown        fhx    Current Outpatient Medications (Endocrine & Metabolic):  [name]  Norethin-Eth Estrad-Fe Biphas (LO LOESTRIN FE PO), Take by mouth.  Current Outpatient Medications (Cardiovascular):  .  spironolactone (ALDACTONE) 25 MG tablet, Take 25 mg by mouth daily.  Current Outpatient Medications (Respiratory):  .  fluticasone (FLONASE) 50 MCG/ACT nasal spray, SPRAY 2 SPRAYS INTO EACH NOSTRIL EVERY DAY  Current Outpatient Medications (Analgesics):  .  SUMAtriptan (IMITREX) 100 MG tablet, Take 1 at onset of  Migraine headache   and can repeat in 2 hours . .  traMADol (ULTRAM) 50 MG tablet, Take 1 tablet (50 mg total) by mouth every 8 (eight) hours as needed.   Current Outpatient Medications (Other):  [name]  ALPRAZolam (XANAX) 0.25 MG tablet, Take 1 tablet (0.25 mg total) by mouth 3 (three) times daily as needed for anxiety (panic). [name]  buPROPion (WELLBUTRIN XL) 300 MG 24 hr tablet, Take 1 tablet (300 mg total) by mouth daily. .  fish oil-omega-3 fatty acids 1000 MG capsule, Take 1 g by mouth daily.   .  fluconazole (DIFLUCAN) 150 MG tablet, 1 pill today then repeat in 4 days .  gabapentin (NEURONTIN) 100 MG capsule, TAKE 2 CAPSULES BY MOUTH AT BEDTIME .  LYSINE PO, Take 1,000 mg by mouth daily. for fever blisters. .  methylphenidate (CONCERTA) 36 MG PO CR  tablet, Take 1 tablet (36 mg total) by mouth daily. .  Nutritional Supplements (DHEA PO)*, Take by mouth. .  valACYclovir (VALTREX) 1000 MG tablet, TAKE 1 TABLET (1,000 MG TOTAL) BY MOUTH 2 (TWO) TIMES DAILY. [name]  Vitamin D, Ergocalciferol, (DRISDOL) 50000 units CAPS capsule, TAKE 1 CAPSULE EVERY 7 DAYS * These medications belong to multiple therapeutic classes and are listed under each applicable group.    Past medical history, social, surgical and family history all reviewed in electronic medical record.  No pertanent information unless stated regarding to the chief complaint.   Review of Systems:  No headache, visual changes, nausea, vomiting, diarrhea, constipation, dizziness, abdominal pain, skin rash, fevers, chills, night sweats, weight loss, swollen lymph nodes, body aches, joint swelling, muscle aches, chest pain, shortness of breath, mood changes.   Objective  Blood pressure 110/70, pulse (!) 111, height 5\' 4"  (1.626 m), weight 119 lb (54 kg), SpO2 98 %.   General: No apparent distress alert and oriented x3 mood and affect normal, dressed appropriately.  HEENT: Pupils equal, extraocular movements intact  Respiratory: Patient's speak in full sentences and does not appear short of breath  Cardiovascular: No lower extremity edema, non tender, no erythema  Skin: Warm dry intact with no signs of infection or rash on extremities or on axial skeleton.  Abdomen: Soft nontender  Neuro: Cranial nerves II through XII are intact, neurovascularly intact in all extremities with 2+ DTRs and 2+ pulses.  Lymph: No lymphadenopathy of posterior or anterior cervical chain or axillae bilaterally.  Gait normal with good balance and coordination.  MSK:  Non tender with full range of motion and good stability and symmetric strength and tone of shoulders, elbows, wrist, hip, knee and ankles bilaterally.  Moderate hypermobility. Back exam shows tightness with some mild degenerative scoliosis.  Positive Faber  on the left side. Osteopathic findings C2 flexed rotated and side bent right T3 extended rotated and side bent right inhaled third rib T5 extended rotated and side bent left L2 flexed rotated and side bent right Sacrum right on right    Impression and Recommendations:     This case required medical decision making of moderate complexity. The above documentation has been reviewed and is accurate and complete Judi Saa, DO       Note: This dictation was prepared with Dragon dictation along with smaller phrase technology. Any transcriptional errors that result from this process are unintentional.

## 2017-10-25 ENCOUNTER — Encounter: Payer: Self-pay | Admitting: Family Medicine

## 2017-10-25 ENCOUNTER — Ambulatory Visit (INDEPENDENT_AMBULATORY_CARE_PROVIDER_SITE_OTHER): Payer: BLUE CROSS/BLUE SHIELD | Admitting: Family Medicine

## 2017-10-25 ENCOUNTER — Ambulatory Visit: Payer: BLUE CROSS/BLUE SHIELD | Admitting: Family Medicine

## 2017-10-25 VITALS — BP 110/70 | HR 111 | Ht 64.0 in | Wt 119.0 lb

## 2017-10-25 DIAGNOSIS — M5416 Radiculopathy, lumbar region: Secondary | ICD-10-CM | POA: Diagnosis not present

## 2017-10-25 DIAGNOSIS — M999 Biomechanical lesion, unspecified: Secondary | ICD-10-CM

## 2017-10-25 DIAGNOSIS — M9901 Segmental and somatic dysfunction of cervical region: Secondary | ICD-10-CM

## 2017-10-25 DIAGNOSIS — M9903 Segmental and somatic dysfunction of lumbar region: Secondary | ICD-10-CM | POA: Diagnosis not present

## 2017-10-25 DIAGNOSIS — R269 Unspecified abnormalities of gait and mobility: Secondary | ICD-10-CM | POA: Diagnosis not present

## 2017-10-25 DIAGNOSIS — M9902 Segmental and somatic dysfunction of thoracic region: Secondary | ICD-10-CM

## 2017-10-25 DIAGNOSIS — M9904 Segmental and somatic dysfunction of sacral region: Secondary | ICD-10-CM | POA: Diagnosis not present

## 2017-10-25 DIAGNOSIS — M9908 Segmental and somatic dysfunction of rib cage: Secondary | ICD-10-CM

## 2017-10-25 NOTE — Assessment & Plan Note (Signed)
Decision today to treat with OMT was based on Physical Exam  After verbal consent patient was treated with HVLA, ME, FPR techniques in cervical, thoracic, rib,  lumbar and sacral areas  Patient tolerated the procedure well with improvement in symptoms  Patient given exercises, stretches and lifestyle modifications  See medications in patient instructions if given  Patient will follow up in 4-8 weeks 

## 2017-10-25 NOTE — Progress Notes (Signed)
Procedure Note   Patient was fitted for a : standard, cushioned, semi-rigid orthotic. The orthotic was heated and afterward the patient patient seated position and molded The patient was positioned in subtalar neutral position and 10 degrees of ankle dorsiflexion in a weight bearing stance. After completion of molding, patient did have orthotic management The blank was ground to a stable position for weight bearing. Size: 6.5-7 Left and right medial 250/70 270/90 transverse 250/60 Base: Carbon fiber Additional Posting and Padding:  The patient ambulated these, and they were very comfortable.

## 2017-10-25 NOTE — Assessment & Plan Note (Signed)
Continues to have tightness.  Discussed icing regimen and home exercise.  Discussed which activities to do which wants to avoid discussed avoiding certain activities such as core strengthening and stability.  Patient does have hypermobility discussed avoiding excessive range of motion.

## 2017-10-25 NOTE — Assessment & Plan Note (Signed)
Orthotics made today.  Discussed icing regimen and home exercise.  Discussed which activities to do which was to avoid.  Discussed slowly wearing them over the course of time.  Adjustments can be made if necessary.  Follow-up again in 4 to 6 weeks

## 2017-10-25 NOTE — Patient Instructions (Signed)
Good to see you  Ice 20 minutes 2 times daily. Usually after activity and before bed. Wear the orthotics 2 hours a day firstday and increase 1 hour daily  Keep doing everything else See me again in 5-6 weeks

## 2017-10-31 ENCOUNTER — Ambulatory Visit: Payer: Self-pay | Admitting: Psychiatry

## 2017-10-31 DIAGNOSIS — F410 Panic disorder [episodic paroxysmal anxiety] without agoraphobia: Secondary | ICD-10-CM

## 2017-10-31 NOTE — Progress Notes (Signed)
      [facility] Counselor/Therapist Progress Note   Patient ID: Terri [name], MRN: [id number]  Date: 10/31/2017  Timespent: 50 minutes  Treatment Type: Individual  Subjective: The client reports that she has been seen by an acupuncturist to treat the staph infection on her face.  The results have been that it has completely cleared up.  The client was very excited about this. This past Friday was the anniversary of her dad's death.  She states that she had an anniversary reaction remembering his death.  She reports that she is angry with her mother, "I do not trust her motives."  She describes her's older sister as being more difficult and having unusual responses recently.  She also notes that her best friend who lives in [place] is moving back to [place].  She also states that her niece is coming to spend Christmas with her in [place].  These are very positive turn of events for the client.  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  She is reading that healing codes which she has found helpful. 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  It will take anywhere from 6 to 8 weeks for it to heal. The client will continue to set boundaries with her older sister and her mother.  She believes her mother just does not understand things but the client understands the value of setting the boundaries. She will journal her thoughts about her dad's death.  Interventions:Solution Focused, Psychosocial Skills: Boundaries and Supportive  Mental Status Exam:   Appearance:   Casual     Behavior:  Appropriate  Motor:  Normal  Speech/Language:   Clear and Coherent  Affect:  Appropriate  Mood:  anxious  Thought process:  Coherent  Thought content:    Logical  Perceptual disturbances:    Normal  Orientation:  Full (Time, Place, and Person)  Attention:  Good  Concentration:  good  Memory:  Immediate  Fund of knowledge:   Good  Insight:     Good  Judgment:   Good  Impulse Control:  good    Reported Symptoms: anxiety, irritation.  Risk Assessment: Danger to Self:  No Self-injurious Behavior: No Danger to Others: No Duty to Warn:no Physical Aggression / Violence:No  Access to Firearms a concern: No  Gang Involvement:No   Diagnosis:   ICD-10-CM   1. Panic disorder without agoraphobia F41.0      Plan: Self care, boundaries with family.  [name] Sofie Schen[place], [place]

## 2017-11-05 ENCOUNTER — Telehealth: Payer: Self-pay | Admitting: Internal Medicine

## 2017-11-05 NOTE — Telephone Encounter (Signed)
Last filled 09/28/17 #30  Last OV 08/08/17 for medication follow up  Please advise Dr Tawanna Cooler if able to refill in Dr Rosezella [place] absence. Thanks.

## 2017-11-05 NOTE — Telephone Encounter (Signed)
Copied from CRM [id number]. Topic: General - Other >> Nov 05, 2017 11:46 AM Leafy Ro wrote: Reason for CRM: pt need refill on concerta. [facility]

## 2017-11-06 ENCOUNTER — Other Ambulatory Visit: Payer: Self-pay | Admitting: Family Medicine

## 2017-11-06 MED ORDER — METHYLPHENIDATE HCL ER (OSM) 36 MG PO TBCR: 36.0000 mg | EXTENDED_RELEASE_TABLET | Freq: Every day | ORAL | 0 refills | Status: DC

## 2017-11-06 NOTE — Telephone Encounter (Signed)
done

## 2017-11-06 NOTE — Telephone Encounter (Signed)
Left detailed message on machine making patient aware that her medication has been refilled to the pharmacy. Nothing further needed.

## 2017-11-07 DIAGNOSIS — H43811 Vitreous degeneration, right eye: Secondary | ICD-10-CM | POA: Diagnosis not present

## 2017-11-12 DIAGNOSIS — M9908 Segmental and somatic dysfunction of rib cage: Secondary | ICD-10-CM | POA: Diagnosis not present

## 2017-11-12 DIAGNOSIS — M5386 Other specified dorsopathies, lumbar region: Secondary | ICD-10-CM | POA: Diagnosis not present

## 2017-11-12 DIAGNOSIS — M9903 Segmental and somatic dysfunction of lumbar region: Secondary | ICD-10-CM | POA: Diagnosis not present

## 2017-11-12 DIAGNOSIS — L718 Other rosacea: Secondary | ICD-10-CM | POA: Diagnosis not present

## 2017-11-12 DIAGNOSIS — M9902 Segmental and somatic dysfunction of thoracic region: Secondary | ICD-10-CM | POA: Diagnosis not present

## 2017-11-15 DIAGNOSIS — M9903 Segmental and somatic dysfunction of lumbar region: Secondary | ICD-10-CM | POA: Diagnosis not present

## 2017-11-15 DIAGNOSIS — M9908 Segmental and somatic dysfunction of rib cage: Secondary | ICD-10-CM | POA: Diagnosis not present

## 2017-11-15 DIAGNOSIS — M9902 Segmental and somatic dysfunction of thoracic region: Secondary | ICD-10-CM | POA: Diagnosis not present

## 2017-11-15 DIAGNOSIS — M5386 Other specified dorsopathies, lumbar region: Secondary | ICD-10-CM | POA: Diagnosis not present

## 2017-11-21 ENCOUNTER — Ambulatory Visit: Payer: Self-pay | Admitting: Psychiatry

## 2017-11-21 DIAGNOSIS — F410 Panic disorder [episodic paroxysmal anxiety] without agoraphobia: Secondary | ICD-10-CM

## 2017-11-21 NOTE — Progress Notes (Signed)
      [facility] Counselor/Therapist Progress Note   Patient ID: Terri [name], MRN: [id number]  Date: 11/21/2017  Timespent: 50 minutes   Treatment Type: Individual   Reported Symptoms: Sleep disturbance and anxiety   Mental Status Exam:    Appearance:   Casual     Behavior:  Appropriate  Motor:  Normal  Speech/Language:   Clear and Coherent  Affect:  Appropriate  Mood:  anxious  Thought process:  normal  Thought content:    WNL  Sensory/Perceptual disturbances:    WNL  Orientation:  oriented to person, place, time/date and situation  Attention:  Good  Concentration:  Good  Memory:  WNL  Fund of knowledge:   Good  Insight:    Good  Judgment:   Good  Impulse Control:  Good     Risk Assessment: Danger to Self:  No Self-injurious Behavior: No Danger to Others: No Duty to Warn:no Physical Aggression / Violence:No  Access to Firearms a concern: No  Gang Involvement:No    Subjective: The client reports that she has been more irritable and her sleep has been off.  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  She is anxious about being able to maintain the gains that she has.  She realizes that she really has no back-up in her life so if something goes wrong she can only count on herself.  We discussed about what she could do to increase her social network and find resources that she could access.  The client is looking forward to her good friend moving back from [place].  She states "he is like a brother to me."  This is someone who can be back up for the client which will be positive. The client is still recovering from the neuroma on her foot.  She is upset because of some mild weight gain.  We discussed acceptance and working towards a level of peace in her life.   Interventions: Solution-Oriented/Positive Psychology, Eye Movement Desensitization and Reprocessing (EMDR) and Insight-Oriented   Diagnosis:   ICD-10-CM    1. Panic disorder without agoraphobia F41.0      Plan: journal, social network.   [name] Shyann Hefner, [place]

## 2017-11-22 DIAGNOSIS — M9908 Segmental and somatic dysfunction of rib cage: Secondary | ICD-10-CM | POA: Diagnosis not present

## 2017-11-22 DIAGNOSIS — M5386 Other specified dorsopathies, lumbar region: Secondary | ICD-10-CM | POA: Diagnosis not present

## 2017-11-22 DIAGNOSIS — M9902 Segmental and somatic dysfunction of thoracic region: Secondary | ICD-10-CM | POA: Diagnosis not present

## 2017-11-28 DIAGNOSIS — M9908 Segmental and somatic dysfunction of rib cage: Secondary | ICD-10-CM | POA: Diagnosis not present

## 2017-11-28 DIAGNOSIS — M9903 Segmental and somatic dysfunction of lumbar region: Secondary | ICD-10-CM | POA: Diagnosis not present

## 2017-11-28 DIAGNOSIS — M9902 Segmental and somatic dysfunction of thoracic region: Secondary | ICD-10-CM | POA: Diagnosis not present

## 2017-12-03 NOTE — Progress Notes (Signed)
[name] [address] [place], [place] [address] Phone: [phone] Subjective:   [name], am serving as a scribe for Dr. Antoine Primas.    CC: Foot pain, elbow pain, back pain  BJY:NWGNFAOZHY  Terri [name] is a 52 y.o. female coming in with complaint of foot pain. She was in [place] 2 weeks ago. She did hike a little bit and now her foot is aggravated. Over the weekend her pain increased. She is using supportive shoes and orthotics. Is unsure if orthotics are working.   Also complaining of right elbow pain. Cannot lift up anything in that hand. Very sensitive to touch over lateral epicondyle. Icing makes elbow worse. Is wearing counterforce brace. Has been doing exercises but feels that arm is too irritated.   Moderate back pain as well.  Patient states continues to have some discomfort and pain.  Seems to be worse after walking.     Past Medical History:  Diagnosis Date  . Anxiety   . Depression   . Environmental allergies   . GERD (gastroesophageal reflux disease)   . Headache(784.0)   . Recurrent sinusitis    ent and pulm eval in past.   Past Surgical History:  Procedure Laterality Date  . HIP ARTHROSCOPY W/ LABRAL DEBRIDEMENT    . left hip surgery     [facility]   Social History   Socioeconomic History  . Marital status: Single    Spouse name: Not on file  . Number of children: Not on file  . Years of education: Not on file  . Highest education level: Not on file  Occupational History  . Not on file  Social Needs  . Financial resource strain: Not on file  . Food insecurity:    Worry: Not on file    Inability: Not on file  . Transportation needs:    Medical: Not on file    Non-medical: Not on file  Tobacco Use  . Smoking status: Never Smoker  . Smokeless tobacco: Never Used  Substance and Sexual Activity  . Alcohol use: Yes    Comment: Occasional use  . Drug use: No  . Sexual activity: Yes    Birth  control/protection: Pill  Lifestyle  . Physical activity:    Days per week: Not on file    Minutes per session: Not on file  . Stress: Not on file  Relationships  . Social connections:    Talks on phone: Not on file    Gets together: Not on file    Attends religious service: Not on file    Active member of club or organization: Not on file    Attends meetings of clubs or organizations: Not on file    Relationship status: Not on file  Other Topics Concern  . 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 Augmentin [Amoxicillin-Pot Clavulanate] Nausea Only  . Celecoxib     REACTION: unspecified  . Sulfonamide Derivatives     REACTION: hives, throat swelling  . Monistat [Miconazole] Other (See Comments)    Sensitive to topical vaginal  Azoles No side effects with oral Diflucan   Family History  Problem Relation Age of Onset  . Heart disease Unknown        fhx    Current Outpatient Medications (Endocrine & Metabolic):  [name]  Norethin-Eth Estrad-Fe Biphas (LO LOESTRIN  FE PO), Take by mouth.  Current Outpatient Medications (Cardiovascular):  .  spironolactone (ALDACTONE) 25 MG tablet, Take 25 mg by mouth daily.  Current Outpatient Medications (Respiratory):  .  fluticasone (FLONASE) 50 MCG/ACT nasal spray, SPRAY 2 SPRAYS INTO EACH NOSTRIL EVERY DAY  Current Outpatient Medications (Analgesics):  [name]  SUMAtriptan (IMITREX) 100 MG tablet, Take 1 at onset of  Migraine headache   and can repeat in 2 hours . .  traMADol (ULTRAM) 50 MG tablet, Take 1 tablet (50 mg total) by mouth every 8 (eight) hours as needed.   Current Outpatient Medications (Other):  [name]  ALPRAZolam (XANAX) 0.25 MG tablet, Take 1 tablet (0.25 mg total) by mouth 3 (three) times daily as needed for anxiety (panic). [name]  buPROPion (WELLBUTRIN XL) 300 MG 24 hr tablet, Take 1 tablet (300 mg total) by mouth daily. .  fish oil-omega-3 fatty acids 1000 MG  capsule, Take 1 g by mouth daily.   .  fluconazole (DIFLUCAN) 150 MG tablet, 1 pill today then repeat in 4 days .  gabapentin (NEURONTIN) 100 MG capsule, TAKE 2 CAPSULES BY MOUTH AT BEDTIME .  LYSINE PO, Take 1,000 mg by mouth daily. for fever blisters. .  methylphenidate (CONCERTA) 36 MG PO CR tablet, Take 1 tablet (36 mg total) by mouth daily. .  Nutritional Supplements (DHEA PO)*, Take by mouth. .  valACYclovir (VALTREX) 1000 MG tablet, TAKE 1 TABLET (1,000 MG TOTAL) BY MOUTH 2 (TWO) TIMES DAILY. [name]  Vitamin D, Ergocalciferol, (DRISDOL) 50000 units CAPS capsule, TAKE 1 CAPSULE EVERY 7 DAYS * These medications belong to multiple therapeutic classes and are listed under each applicable group.    Past medical history, social, surgical and family history all reviewed in electronic medical record.  No pertanent information unless stated regarding to the chief complaint.   Review of Systems:  No headache, visual changes, nausea, vomiting, diarrhea, constipation, dizziness, abdominal pain, skin rash, fevers, chills, night sweats, weight loss, swollen lymph nodes, body aches, joint swelling chest pain, shortness of breath, mood changes.  Positive muscle aches  Objective  Blood pressure 98/72, pulse 88, height 5\' 4"  (1.626 m), weight 121 lb (54.9 kg), SpO2 98 %.    General: No apparent distress alert and oriented x3 mood and affect normal, dressed appropriately.  HEENT: Pupils equal, extraocular movements intact  Respiratory: Patient's speak in full sentences and does not appear short of breath  Cardiovascular: No lower extremity edema, non tender, no erythema  Skin: Warm dry intact with no signs of infection or rash on extremities or on axial skeleton.  Abdomen: Soft nontender  Neuro: Cranial nerves II through XII are intact, neurovascularly intact in all extremities with 2+ DTRs and 2+ pulses.  Lymph: No lymphadenopathy of posterior or anterior cervical chain or axillae bilaterally.  Gait  normal with good balance and coordination.  MSK:  Non tender with full range of motion and good stability and symmetric strength and tone of shoulders, , wrist, hip, knee and ankles bilaterally.  Hypermobility noted Right elbow exam shows severe tenderness of the ordinary.  Pain over the radial head.  With full extension pressure on the posterior aspect of the radial head patient had an audible pop and significant improvement in increase range of motion. Foot pain does have positive squeeze test and pain between the fourth and fifth metatarsal heads  Back exam diffusely tender of the lumbar spine.  Patient does have some mild tightness with Pearlean Brownie test on the left side.  Negative  straight leg test.  4+ out of 5 strength of the left lower extremity compared to the contralateral side.  Osteopathic findings C4 flexed rotated and side bent left C6 flexed rotated and side bent left T3 extended rotated and side bent right inhaled third rib T7 extended rotated and side bent left L3 flexed rotated and side bent right Sacrum right on right Right anterior pelvic shear   Impression and Recommendations:      The above documentation has been reviewed and is accurate and complete Judi Saa, DO       Note: This dictation was prepared with Dragon dictation along with smaller phrase technology. Any transcriptional errors that result from this process are unintentional.

## 2017-12-04 ENCOUNTER — Encounter: Payer: Self-pay | Admitting: Family Medicine

## 2017-12-04 ENCOUNTER — Ambulatory Visit: Payer: BLUE CROSS/BLUE SHIELD | Admitting: Family Medicine

## 2017-12-04 VITALS — BP 98/72 | HR 88 | Ht 64.0 in | Wt 121.0 lb

## 2017-12-04 DIAGNOSIS — M9903 Segmental and somatic dysfunction of lumbar region: Secondary | ICD-10-CM

## 2017-12-04 DIAGNOSIS — M9904 Segmental and somatic dysfunction of sacral region: Secondary | ICD-10-CM

## 2017-12-04 DIAGNOSIS — M9902 Segmental and somatic dysfunction of thoracic region: Secondary | ICD-10-CM | POA: Diagnosis not present

## 2017-12-04 DIAGNOSIS — M533 Sacrococcygeal disorders, not elsewhere classified: Secondary | ICD-10-CM | POA: Diagnosis not present

## 2017-12-04 DIAGNOSIS — M9901 Segmental and somatic dysfunction of cervical region: Secondary | ICD-10-CM

## 2017-12-04 DIAGNOSIS — R269 Unspecified abnormalities of gait and mobility: Secondary | ICD-10-CM

## 2017-12-04 DIAGNOSIS — M9905 Segmental and somatic dysfunction of pelvic region: Secondary | ICD-10-CM

## 2017-12-04 DIAGNOSIS — M9908 Segmental and somatic dysfunction of rib cage: Secondary | ICD-10-CM

## 2017-12-04 DIAGNOSIS — M999 Biomechanical lesion, unspecified: Secondary | ICD-10-CM | POA: Diagnosis not present

## 2017-12-04 DIAGNOSIS — M357 Hypermobility syndrome: Secondary | ICD-10-CM | POA: Diagnosis not present

## 2017-12-04 NOTE — Assessment & Plan Note (Signed)
Mild worsening.  Discussed his directive.  Discussed posture and ergonomics.  Responds well to manipulation.  Follow-up again in 4 to 8 weeks

## 2017-12-04 NOTE — Assessment & Plan Note (Addendum)
Decision today to treat with OMT was based on Physical Exam  After verbal consent patient was treated with HVLA, ME, FPR techniques in cervical, thoracic, rib, lumbar and sacral pelvis areas  Patient tolerated the procedure well with improvement in symptoms  Patient given exercises, stretches and lifestyle modifications  See medications in patient instructions if given  Patient will follow up in 4 weeks

## 2017-12-04 NOTE — Patient Instructions (Signed)
Good to see you  Do not lace last eye of your shoe. Tried changing the orthotics and see how it does.  Avoid yoga  Weights and stick to the machines for next month  [email or web address] size small elbow compression sleeve.  See me again in 4 weeks

## 2017-12-04 NOTE — Assessment & Plan Note (Signed)
He changes the patient's orthotics.  Discussed icing regimen and home exercises.  Which activities to do which wants to avoid.  Follow-up in 4 weeks

## 2017-12-05 ENCOUNTER — Ambulatory Visit: Payer: Self-pay | Admitting: Psychiatry

## 2017-12-05 DIAGNOSIS — F410 Panic disorder [episodic paroxysmal anxiety] without agoraphobia: Secondary | ICD-10-CM

## 2017-12-05 NOTE — Progress Notes (Signed)
      [facility] Counselor/Therapist Progress Note   Patient ID: Davian Wollenberg, MRN: [id number]  Date: 12/05/2017 4  Timespent: 48 minutes   Treatment Type: Individual   Reported Symptoms: stress   Mental Status Exam:    Appearance:   Well Groomed     Behavior:  Appropriate  Motor:  Normal  Speech/Language:   Clear and Coherent  Affect:  Appropriate  Mood:  anxious  Thought process:  normal  Thought content:    WNL  Sensory/Perceptual disturbances:    WNL  Orientation:  oriented to person, place, time/date and situation  Attention:  Good  Concentration:  Good  Memory:  WNL  Fund of knowledge:   Good  Insight:    Good  Judgment:   Good  Impulse Control:  Good     Risk Assessment: Danger to Self:  No Self-injurious Behavior: No Danger to Others: No Duty to Warn:no Physical Aggression / Violence:No  Access to Firearms a concern: No  Gang Involvement:No    Subjective: Client has recently traveled to [place] to visit a friend for his birthday.  She is excited that he is returning back to [place] to live permanently.  The client's most recent stressor has been the diagnosis of her older sister's lymphoma.  She is being treated at [facility] and will be having a bone marrow biopsy this next week.  This has added a layer of stress for the client although her sister downplays it. 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  This is a deficit double-sided coin.  On one side she is happy with the increased business.  On the other side she is stressed because she has so much business. She recently met with her primary care physician Dr. Hulan Saas.  Her right elbow was swollen and he discovered that a bone had popped out of place.  She is hyper extended in her joints which can cause problems.  She will no longer be able to do yoga since it complicates that issue.  She is going to begin weight training to build her muscle strength to hold her joints  in place. Overall the client continues to exercise and take care of herself using positive self talk and reading self-help books.   Interventions: Solution-Oriented/Positive Psychology, Psycho-education/Bibliotherapy and Insight-Oriented   Diagnosis:   ICD-10-CM   1. Panic disorder without agoraphobia F41.0      Plan: Bibliotherapy, self care, exercise.   Albertina Parr Haidy Kack[place], [place]

## 2017-12-07 ENCOUNTER — Other Ambulatory Visit: Payer: Self-pay | Admitting: Internal Medicine

## 2017-12-07 MED ORDER — METHYLPHENIDATE HCL ER (OSM) 36 MG PO TBCR: 36.0000 mg | EXTENDED_RELEASE_TABLET | Freq: Every day | ORAL | 0 refills | Status: DC

## 2017-12-07 NOTE — Telephone Encounter (Signed)
Please advise Dr Panosh, thanks.   

## 2017-12-07 NOTE — Telephone Encounter (Unsigned)
Copied from CRM [phone]#[id number]. Topic: Quick Communication - Rx Refill/Question >> Dec 07, 2017 10:52 AM Terri [name], Terri [name] wrote: Medication: methylphenidate (CONCERTA) 36 MG PO CR tablet  Has the patient contacted their pharmacy? no  Preferred Pharmacy (with phone number or street name): [id number] - [place] - [address] [phone] (Phone) [phone] (Fax)  Agent: Please be advised that RX refills may take up to 3 business days. We ask that you follow-up with your pharmacy.

## 2017-12-07 NOTE — Telephone Encounter (Signed)
Requested medication (s) are due for refill today: yes  Requested medication (s) are on the active medication list: yes  Last refill:  11/06/17  Future visit scheduled: yes  Notes to clinic:  Controlled substance   Requested Prescriptions  Pending Prescriptions Disp Refills   methylphenidate (CONCERTA) 36 MG PO CR tablet 30 tablet 0    Sig: Take 1 tablet (36 mg total) by mouth daily.     Not Delegated - Psychiatry:  Stimulants/ADHD Failed - 12/07/2017 11:43 AM      Failed - This refill cannot be delegated      Failed - Urine Drug Screen completed in last 360 days.      Passed - Valid encounter within last 3 months    Recent Outpatient Visits          3 days ago Nonallopathic lesion of rib cage   [facility] Smith, Zachary M, DO   1 month ago Abnormality of gait   [facility] [place] Smith, Zachary M, DO   1 month ago Nonallopathic lesion of pelvic region   [name] Judi SaaSmith, Zachary M, DO   2 months ago Nonallopathic lesion of pelvic region   [facility] Smith, Zachary M, DO   3 months ago Nonallopathic lesion of rib cage   [facility] Smith, Zachary M, DO      Future Appointments            In 3 weeks Judi SaaSmith, Zachary M, DO [facility]   In 2 months Panosh, Neta MendsWanda K, MD [facility] [place]

## 2017-12-07 NOTE — Telephone Encounter (Signed)
Sent in electronically .  

## 2017-12-11 DIAGNOSIS — M9902 Segmental and somatic dysfunction of thoracic region: Secondary | ICD-10-CM | POA: Diagnosis not present

## 2017-12-11 DIAGNOSIS — M9903 Segmental and somatic dysfunction of lumbar region: Secondary | ICD-10-CM | POA: Diagnosis not present

## 2017-12-11 DIAGNOSIS — M5386 Other specified dorsopathies, lumbar region: Secondary | ICD-10-CM | POA: Diagnosis not present

## 2017-12-25 ENCOUNTER — Ambulatory Visit: Payer: Self-pay | Admitting: Psychiatry

## 2017-12-31 NOTE — Progress Notes (Signed)
[name] [address] [place], [place] [address] Phone: [phone] Subjective:   [name], am serving as a scribe for Dr. Antoine Primas.   CC: Neck, back pain, elbow pain follow-up  BJY:NWGNFAOZHY  Terri [name] is a 52 y.o. female coming in with complaint of right elbow pain. She does use the sleeve on the right elbow. She has had a few other dislocations of her radial head since we last saw her. Pain with hand in pronated position and extension of the elbow. Has been icing and heating.   Patient is also having improved pain with her right foot. Has been using orthotics in each shoe and is able to walk bare foot without pain.   Patient has been having back pain that is managed with OMT. She feels like she has some ribs out of place as she has been coughing a lot due to being sick for the past 3 weeks. Patient has been having a sore throat and cough but does feel like she is slowly improving.      Past Medical History:  Diagnosis Date  . Anxiety   . Depression   . Environmental allergies   . GERD (gastroesophageal reflux disease)   . Headache(784.0)   . Recurrent sinusitis    ent and pulm eval in past.   Past Surgical History:  Procedure Laterality Date  . HIP ARTHROSCOPY W/ LABRAL DEBRIDEMENT    . left hip surgery     [facility]   Social History   Socioeconomic History  . Marital status: Single    Spouse name: Not on file  . Number of children: Not on file  . Years of education: Not on file  . Highest education level: Not on file  Occupational History  . Not on file  Social Needs  . Financial resource strain: Not on file  . Food insecurity:    Worry: Not on file    Inability: Not on file  . Transportation needs:    Medical: Not on file    Non-medical: Not on file  Tobacco Use  . Smoking status: Never Smoker  . Smokeless tobacco: Never Used  Substance and Sexual Activity  . Alcohol use: Yes    Comment: Occasional use  .  Drug use: No  . Sexual activity: Yes    Birth control/protection: Pill  Lifestyle  . Physical activity:    Days per week: Not on file    Minutes per session: Not on file  . Stress: Not on file  Relationships  . Social connections:    Talks on phone: Not on file    Gets together: Not on file    Attends religious service: Not on file    Active member of club or organization: Not on file    Attends meetings of clubs or organizations: Not on file    Relationship status: Not on file  Other Topics Concern  . 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 Augmentin [Amoxicillin-Pot Clavulanate] Nausea Only  . Celecoxib     REACTION: unspecified  . Sulfonamide Derivatives     REACTION: hives, throat swelling  . Monistat [Miconazole] Other (See Comments)    Sensitive to topical vaginal  Azoles No side effects with oral Diflucan   Family History  Problem Relation Age of Onset  . Heart disease Unknown  fhx    Current Outpatient Medications (Endocrine & Metabolic):  [name]  Norethin-Eth Estrad-Fe Biphas (LO LOESTRIN FE PO), Take by mouth.  Current Outpatient Medications (Cardiovascular):  .  spironolactone (ALDACTONE) 25 MG tablet, Take 25 mg by mouth daily.  Current Outpatient Medications (Respiratory):  .  fluticasone (FLONASE) 50 MCG/ACT nasal spray, SPRAY 2 SPRAYS INTO EACH NOSTRIL EVERY DAY  Current Outpatient Medications (Analgesics):  [name]  SUMAtriptan (IMITREX) 100 MG tablet, Take 1 at onset of  Migraine headache   and can repeat in 2 hours . .  traMADol (ULTRAM) 50 MG tablet, Take 1 tablet (50 mg total) by mouth every 8 (eight) hours as needed.   Current Outpatient Medications (Other):  [name]  ALPRAZolam (XANAX) 0.25 MG tablet, Take 1 tablet (0.25 mg total) by mouth 3 (three) times daily as needed for anxiety (panic). [name]  buPROPion (WELLBUTRIN XL) 300 MG 24 hr tablet, Take 1 tablet (300 mg total) by mouth  daily. .  fish oil-omega-3 fatty acids 1000 MG capsule, Take 1 g by mouth daily.   .  fluconazole (DIFLUCAN) 150 MG tablet, 1 pill today then repeat in 4 days .  gabapentin (NEURONTIN) 100 MG capsule, TAKE 2 CAPSULES BY MOUTH AT BEDTIME .  LYSINE PO, Take 1,000 mg by mouth daily. for fever blisters. .  methylphenidate (CONCERTA) 36 MG PO CR tablet, Take 1 tablet (36 mg total) by mouth daily. .  Nutritional Supplements (DHEA PO)*, Take by mouth. .  valACYclovir (VALTREX) 1000 MG tablet, TAKE 1 TABLET (1,000 MG TOTAL) BY MOUTH 2 (TWO) TIMES DAILY. [name]  Vitamin D, Ergocalciferol, (DRISDOL) 50000 units CAPS capsule, TAKE 1 CAPSULE EVERY 7 DAYS .  azithromycin (ZITHROMAX) 500 MG tablet, Take 1 tablet (500 mg total) by mouth daily. * These medications belong to multiple therapeutic classes and are listed under each applicable group.    Past medical history, social, surgical and family history all reviewed in electronic medical record.  No pertanent information unless stated regarding to the chief complaint.   Review of Systems:  No headache, visual changes, nausea, vomiting, diarrhea, constipation, dizziness, abdominal pain, skin rash, fevers, chills, night sweats, weight loss, swollen lymph nodes, body aches, joint swelling, \ chest pain, shortness of breath, mood changes.  Positive muscle aches  Objective  Blood pressure 132/74, pulse (!) 102, height 5\' 4"  (1.626 m), weight 122 lb (55.3 kg), SpO2 97 %.    General: No apparent distress alert and oriented x3 mood and affect normal, dressed appropriately.  HEENT: Pupils equal, extraocular movements intact  Respiratory: Patient's speak in full sentences and does not appear short of breath  Cardiovascular: No lower extremity edema, non tender, no erythema  Skin: Warm dry intact with no signs of infection or rash on extremities or on axial skeleton.  Abdomen: Soft nontender  Neuro: Cranial nerves II through XII are intact, neurovascularly intact in  all extremities with 2+ DTRs and 2+ pulses.  Lymph: No lymphadenopathy of posterior or anterior cervical chain or axillae bilaterally.  Gait normal with good balance and coordination.  MSK:  Non tender with full range of motion and good stability and symmetric strength and tone of shoulders, wrist, hip, knee and ankles bilaterally.  Hypermobility noted of multiple joints    Pain seems to be improved.  Patient just has pain with complete extension of the elbow.  Mild discomfort over the radial head but patient has good grip strength and full supination and pronation.  Neck exam has some mild loss  of lordosis.  Hypermobility is noted.  Tender in the paraspinal musculature of the neck in the parascapular region of the musculature bilaterally  Osteopathic findings   C2 flexed rotated and side bent right C4 flexed rotated and side bent left C6 flexed rotated and side bent left T3 extended rotated and side bent right inhaled third rib T9 extended rotated and side bent left L2 flexed rotated and side bent right Sacrum right on right Right pelvic shear     Impression and Recommendations:     This case required medical decision making of moderate complexity. The above documentation has been reviewed and is accurate and complete Judi SaaZachary M Smith, DO       Note: This dictation was prepared with Dragon dictation along with smaller phrase technology. Any transcriptional errors that result from this process are unintentional.

## 2018-01-01 ENCOUNTER — Ambulatory Visit: Payer: BLUE CROSS/BLUE SHIELD | Admitting: Family Medicine

## 2018-01-01 ENCOUNTER — Encounter: Payer: Self-pay | Admitting: Family Medicine

## 2018-01-01 VITALS — BP 132/74 | HR 102 | Ht 64.0 in | Wt 122.0 lb

## 2018-01-01 DIAGNOSIS — M999 Biomechanical lesion, unspecified: Secondary | ICD-10-CM | POA: Diagnosis not present

## 2018-01-01 DIAGNOSIS — M542 Cervicalgia: Secondary | ICD-10-CM | POA: Diagnosis not present

## 2018-01-01 DIAGNOSIS — M9983 Other biomechanical lesions of lumbar region: Secondary | ICD-10-CM | POA: Diagnosis not present

## 2018-01-01 MED ORDER — AZITHROMYCIN 500 MG PO TABS: 500.0000 mg | ORAL_TABLET | Freq: Every day | ORAL | 0 refills | Status: DC

## 2018-01-01 NOTE — Assessment & Plan Note (Signed)
Neck pain.  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  Has changed her work environment.  Responds well to manipulation.  I do feel an underlying bronchitis is contributing to some of the aches and pains.  Treated with azithromycin.  Follow-up again in 4 to 8 weeks

## 2018-01-01 NOTE — Patient Instructions (Signed)
Good to see you  Ice is your friend Rockin rye! \Lets continue the compression on the elbow another 2 weeks See me again in 4 weeks Happy New Year!

## 2018-01-01 NOTE — Assessment & Plan Note (Addendum)
Decision today to treat with OMT was based on Physical Exam  After verbal consent patient was treated with HVLA, ME, FPR techniques in cervical, thoracic, rib, lumbar and sacral and pelvis  areas  Patient tolerated the procedure well with improvement in symptoms  Patient given exercises, stretches and lifestyle modifications  See medications in patient instructions if given  Patient will follow up in 4-8 weeks 

## 2018-01-10 ENCOUNTER — Ambulatory Visit (INDEPENDENT_AMBULATORY_CARE_PROVIDER_SITE_OTHER): Payer: BLUE CROSS/BLUE SHIELD | Admitting: Psychiatry

## 2018-01-10 ENCOUNTER — Encounter: Payer: Self-pay | Admitting: Psychiatry

## 2018-01-10 DIAGNOSIS — F4322 Adjustment disorder with anxiety: Secondary | ICD-10-CM

## 2018-01-10 NOTE — Progress Notes (Signed)
      [facility] Counselor/Therapist Progress Note  Patient ID: Charlotta NewtonJulie Staffieri, MRN: [id number],    Date: 01/10/2018  Time Spent: 50 minutes   Treatment Type: Individual Therapy  Reported Symptoms: Fatigue  Mental Status Exam:  Appearance:   Casual and Well Groomed     Behavior:  Appropriate  Motor:  Normal  Speech/Language:   Clear and Coherent  Affect:  Appropriate  Mood:  normal  Thought process:  normal  Thought content:    WNL  Sensory/Perceptual disturbances:    WNL  Orientation:  oriented to person, place, time/date and situation  Attention:  Good  Concentration:  Good  Memory:  WNL  Fund of knowledge:   Good  Insight:    Good  Judgment:   Good  Impulse Control:  Good   Risk Assessment: Danger to Self:  No Self-injurious Behavior: No Danger to Others: No Duty to Warn:no Physical Aggression / Violence:No  Access to Firearms a concern: No  Gang Involvement:No   Subjective: The client reports that she has had a little less anxiety.  She is realized recently that she has trouble being vulnerable.  She states, "I do not have the words to express myself correctly."  As a result the client withdraws.  She also states, "I have a bad picker."  Meaning her choices of men. We discussed what her belief system at her core was about.  She realizes that it comes from her mother who her own mother died when she was 4.  The client believes that she never learned from her mom how to communicate effectively.  In her family kindness was seen as vulnerability and vulnerability was weakness.  The client worked on this today using the EMDR hand paddles.  She is reading and watching programs that are self-help related to help to develop this skill.  The client was more confident that she could be more effective in this area.  Interventions: Psychologist, occupationalocial Skills Training, Solution-Oriented/Positive Psychology, Devon EnergyEye Movement Desensitization and Reprocessing (EMDR) and  Insight-Oriented  Diagnosis:   ICD-10-CM   1. Adjustment disorder with anxiety F43.22     Plan: Bibliotherapy, self-care, assertiveness.  Gelene MinkFrederick Stuart Guillen, [place]
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
## 2018-01-22 ENCOUNTER — Other Ambulatory Visit: Payer: Self-pay | Admitting: Internal Medicine

## 2018-01-22 NOTE — Telephone Encounter (Signed)
Copied from CRM [id number]. Topic: Quick Communication - Rx Refill/Question >> Jan 22, 2018  1:20 PM Crist InfanteHarrald, Kathy J wrote: Medication: methylphenidate (CONCERTA) 36 MG PO CR tablet  [id number] - , [place] - [address] [facility] [phone] (Phone) [phone] (Fax)

## 2018-01-24 MED ORDER — METHYLPHENIDATE HCL ER (OSM) 36 MG PO TBCR: 36.0000 mg | EXTENDED_RELEASE_TABLET | Freq: Every day | ORAL | 0 refills | Status: DC

## 2018-01-24 NOTE — Telephone Encounter (Signed)
Last ov:08/08/17 Last refill:12/07/17 Okay to fill?  Please advise

## 2018-01-30 NOTE — Progress Notes (Signed)
[name] [address] [place], [place] [address] Phone: [phone] Subjective:   [name], am serving as a scribe for Dr. Antoine Primas.   CC: Back and neck pain  EBX:[id number]  Terri [name] is a 53 y.o. female coming in with complaint of back and neck pain. Overall is doing well as far as her back pain is concerned. She is still having nasal congestion, sinus pain and a hoarse voice since we last saw her. Did have vertigo 5 nights last week.    Continues to have right elbow pain. Has been modifying her work postures in order to reduce pain. Wears brace that helps with pain.  Has been walking and her left foot has been aching. Is using orthotics and feels that these have been helping.       Past Medical History:  Diagnosis Date  . Anxiety   . Depression   . Environmental allergies   . GERD (gastroesophageal reflux disease)   . Headache(784.0)   . Recurrent sinusitis    ent and pulm eval in past.   Past Surgical History:  Procedure Laterality Date  . HIP ARTHROSCOPY W/ LABRAL DEBRIDEMENT    . left hip surgery     [facility]   Social History   Socioeconomic History  . Marital status: Single    Spouse name: Not on file  . Number of children: Not on file  . Years of education: Not on file  . Highest education level: Not on file  Occupational History  . Not on file  Social Needs  . Financial resource strain: Not on file  . Food insecurity:    Worry: Not on file    Inability: Not on file  . Transportation needs:    Medical: Not on file    Non-medical: Not on file  Tobacco Use  . Smoking status: Never Smoker  . Smokeless tobacco: Never Used  Substance and Sexual Activity  . Alcohol use: Yes    Comment: Occasional use  . Drug use: No  . Sexual activity: Yes    Birth control/protection: Pill  Lifestyle  . Physical activity:    Days per week: Not on file    Minutes per session: Not on file  . Stress: Not on file    Relationships  . Social connections:    Talks on phone: Not on file    Gets together: Not on file    Attends religious service: Not on file    Active member of club or organization: Not on file    Attends meetings of clubs or organizations: Not on file    Relationship status: Not on file  Other Topics Concern  . 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 Augmentin [Amoxicillin-Pot Clavulanate] Nausea Only  . Celecoxib     REACTION: unspecified  . Sulfonamide Derivatives     REACTION: hives, throat swelling  . Monistat [Miconazole] Other (See Comments)    Sensitive to topical vaginal  Azoles No side effects with oral Diflucan   Family History  Problem Relation Age of Onset  . Heart disease Unknown        fhx    Current Outpatient Medications (Endocrine & Metabolic):  [name]  Norethin-Eth Estrad-Fe Biphas (LO LOESTRIN FE PO), Take by mouth.  Current Outpatient Medications (Cardiovascular):  .  spironolactone (ALDACTONE) 25 MG tablet,  Take 25 mg by mouth daily.  Current Outpatient Medications (Respiratory):  .  fluticasone (FLONASE) 50 MCG/ACT nasal spray, SPRAY 2 SPRAYS INTO EACH NOSTRIL EVERY DAY  Current Outpatient Medications (Analgesics):  [name]  SUMAtriptan (IMITREX) 100 MG tablet, Take 1 at onset of  Migraine headache   and can repeat in 2 hours . .  traMADol (ULTRAM) 50 MG tablet, Take 1 tablet (50 mg total) by mouth every 8 (eight) hours as needed.   Current Outpatient Medications (Other):  [name]  ALPRAZolam (XANAX) 0.25 MG tablet, Take 1 tablet (0.25 mg total) by mouth 3 (three) times daily as needed for anxiety (panic). [name]  azithromycin (ZITHROMAX) 500 MG tablet, Take 1 tablet (500 mg total) by mouth daily. [name]  buPROPion (WELLBUTRIN XL) 300 MG 24 hr tablet, Take 1 tablet (300 mg total) by mouth daily. .  fish oil-omega-3 fatty acids 1000 MG capsule, Take 1 g by mouth daily.   .  fluconazole (DIFLUCAN) 150  MG tablet, 1 pill today then repeat in 4 days .  gabapentin (NEURONTIN) 100 MG capsule, TAKE 2 CAPSULES BY MOUTH AT BEDTIME .  LYSINE PO, Take 1,000 mg by mouth daily. for fever blisters. .  methylphenidate (CONCERTA) 36 MG PO CR tablet, Take 1 tablet (36 mg total) by mouth daily. .  Nutritional Supplements (DHEA PO)*, Take by mouth. .  valACYclovir (VALTREX) 1000 MG tablet, TAKE 1 TABLET (1,000 MG TOTAL) BY MOUTH 2 (TWO) TIMES DAILY. [name]  Vitamin D, Ergocalciferol, (DRISDOL) 50000 units CAPS capsule, TAKE 1 CAPSULE EVERY 7 DAYS * These medications belong to multiple therapeutic classes and are listed under each applicable group.    Past medical history, social, surgical and family history all reviewed in electronic medical record.  No pertanent information unless stated regarding to the chief complaint.   Review of Systems:  No headache, visual changes, nausea, vomiting, diarrhea, constipation, dizziness, abdominal pain, skin rash, fevers, chills, night sweats, weight loss, swollen lymph nodes, body aches, joint swelling, muscle achess.  Positive muscle aches  Objective  Blood pressure 120/72, pulse 99, height 5\' 4"  (1.626 m), weight 122 lb (55.3 kg), SpO2 98 %.    General: No apparent distress alert and oriented x3 mood and affect normal, dressed appropriately.  HEENT: Pupils equal, extraocular movements intact  Respiratory: Patient's speak in full sentences and does not appear short of breath  Cardiovascular: No lower extremity edema, non tender, no erythema  Skin: Warm dry intact with no signs of infection or rash on extremities or on axial skeleton.  Abdomen: Soft nontender  Neuro: Cranial nerves II through XII are intact, neurovascularly intact in all extremities with 2+ DTRs and 2+ pulses.  Lymph: No lymphadenopathy of posterior or anterior cervical chain or axillae bilaterally.  Gait norma mild antalgic MSK:  tender with mild to moderate  Loss off motion and good stability and  symmetric strength and tone of shoulders, elbows, wrist, hip, knee and ankles bilaterally.  Hypermobility of multiple joints  Back exam does have some loss of lordosis.  Patient does have mild tightness with Pearlean Brownie test bilaterally.  Patient does have some tenderness to palpation in the L5-S1 bilaterally.  Osteopathic findings C2 flexed rotated and side bent right C4 flexed rotated and side bent left T3 extended rotated and side bent right inhaled third rib T9 extended rotated and side bent left L2 flexed rotated and side bent right Sacrum right on right    Impression and Recommendations:     This case required  medical decision making of moderate complexity. The above documentation has been reviewed and is accurate and complete Lyndal Pulley, DO       Note: This dictation was prepared with Dragon dictation along with smaller phrase technology. Any transcriptional errors that result from this process are unintentional.

## 2018-01-31 ENCOUNTER — Ambulatory Visit: Payer: BLUE CROSS/BLUE SHIELD | Admitting: Family Medicine

## 2018-01-31 ENCOUNTER — Encounter: Payer: Self-pay | Admitting: Family Medicine

## 2018-01-31 VITALS — BP 120/72 | HR 99 | Ht 64.0 in | Wt 122.0 lb

## 2018-01-31 DIAGNOSIS — M999 Biomechanical lesion, unspecified: Secondary | ICD-10-CM

## 2018-01-31 DIAGNOSIS — M5416 Radiculopathy, lumbar region: Secondary | ICD-10-CM

## 2018-01-31 DIAGNOSIS — M357 Hypermobility syndrome: Secondary | ICD-10-CM | POA: Diagnosis not present

## 2018-01-31 NOTE — Patient Instructions (Signed)
Good to see you  Ice is your friend Stay active Try the inhaler daily until you get your voice back  See me again in 4-6 weeks

## 2018-01-31 NOTE — Assessment & Plan Note (Signed)
Decision today to treat with OMT was based on Physical Exam  After verbal consent patient was treated with HVLA, ME, FPR techniques in cervical, thoracic, rib lumbar and sacral areas  Patient tolerated the procedure well with improvement in symptoms  Patient given exercises, stretches and lifestyle modifications  See medications in patient instructions if given  Patient will follow up in 4-8 weeks 

## 2018-01-31 NOTE — Assessment & Plan Note (Signed)
Likely no significant radicular symptoms patient does have some tightness of the hip psoas recently.  Patient does have some discomfort and pain with it.  Some may be mild increase in tightness.  Discussed with patient in great length and encouraged her to do more of the core strengthening.  Follow-up in 4 to 8 weeks

## 2018-01-31 NOTE — Assessment & Plan Note (Signed)
Patient does have hypermobility problems.  Discussed icing regimen and home exercise.  Discussed which activities of doing which also avoid.  Discussed posture and ergonomics.  Follow-up 4 to 8 weeks

## 2018-02-04 ENCOUNTER — Encounter: Payer: Self-pay | Admitting: Psychiatry

## 2018-02-04 ENCOUNTER — Ambulatory Visit: Payer: BLUE CROSS/BLUE SHIELD | Admitting: Psychiatry

## 2018-02-04 DIAGNOSIS — F4322 Adjustment disorder with anxiety: Secondary | ICD-10-CM

## 2018-02-04 NOTE — Progress Notes (Signed)
      [facility] Counselor/Therapist Progress Note  Patient ID: Terri [name], MRN: [id number],    Date: 02/04/2018  Time Spent: 49 minutes   Treatment Type: Individual Therapy  Reported Symptoms: Anxious Mood  Mental Status Exam:  Appearance:   Casual and Well Groomed     Behavior:  Appropriate  Motor:  Normal  Speech/Language:   Clear and Coherent  Affect:  Appropriate  Mood:  anxious and sad  Thought process:  normal  Thought content:    WNL  Sensory/Perceptual disturbances:    WNL  Orientation:  oriented to person, place, time/date and situation  Attention:  Good  Concentration:  Good  Memory:  WNL  Fund of knowledge:   Good  Insight:    Good  Judgment:   Good  Impulse Control:  Good   Risk Assessment: Danger to Self:  No Self-injurious Behavior: No Danger to Others: No Duty to Warn:no Physical Aggression / Violence:No  Access to Firearms a concern: No  Gang Involvement:No   Subjective: The client states she had a great Christmas with her niece.  She has still been sick with a hoarse throat.  Her sister recently came into town who has lymphoma.  The client took her to [facility] for treatment.  The client feels that the start of the new year has been good for her.  She has plans to be more in line with her inner self.  She states she has been less resistant to things and trying to take things as they come.  The client has been listening to some motivational speakers that has given her new ways of thinking about things.  Her attitude is much more positive.  She does describe some sad anxiety inside of her but she is not sure where it comes from.  We used some EMDR with her to see if we could get to the bottom of it.  The client states that it did decrease but she still unsure as to where it is from.  She agrees she will let it unfold.  Interventions: Solution-Oriented/Positive Psychology, Eye Movement Desensitization and Reprocessing (EMDR) and  Insight-Oriented  Diagnosis:   ICD-10-CM   1. Adjustment disorder with anxiety F43.22     Plan: Positive self talk, boundaries, assertiveness.  [name] Kerby [place], [place]

## 2018-02-08 ENCOUNTER — Ambulatory Visit: Payer: BLUE CROSS/BLUE SHIELD | Admitting: Internal Medicine

## 2018-02-11 NOTE — Progress Notes (Deleted)
No chief complaint on file.   HPI: Heathr Schwerin 53 y.o. come in for med management   Last seen 7 19   Is seeing counselor and Dr.  Katrinka Blazing ROS: See pertinent positives and negatives per HPI.  Past Medical History:  Diagnosis Date  . Anxiety   . Depression   . Environmental allergies   . GERD (gastroesophageal reflux disease)   . Headache(784.0)   . Recurrent sinusitis    ent and pulm eval in past.    Family History  Problem Relation Age of Onset  . Heart disease Unknown        fhx    Social History   Socioeconomic History  . Marital status: Single    Spouse name: Not on file  . Number of children: Not on file  . Years of education: Not on file  . Highest education level: Not on file  Occupational History  . Not on file  Social Needs  . Financial resource strain: Not on file  . Food insecurity:    Worry: Not on file    Inability: Not on file  . Transportation needs:    Medical: Not on file    Non-medical: Not on file  Tobacco Use  . Smoking status: Never Smoker  . Smokeless tobacco: Never Used  Substance and Sexual Activity  . Alcohol use: Yes    Comment: Occasional use  . Drug use: No  . Sexual activity: Yes    Birth control/protection: Pill  Lifestyle  . Physical activity:    Days per week: Not on file    Minutes per session: Not on file  . Stress: Not on file  Relationships  . Social connections:    Talks on phone: Not on file    Gets together: Not on file    Attends religious service: Not on file    Active member of club or organization: Not on file    Attends meetings of clubs or organizations: Not on file    Relationship status: Not on file  Other Topics Concern  . 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 ALPRAZolam (XANAX) 0.25 MG tablet Take 1 tablet (0.25 mg total) by mouth 3 (three) times daily as needed for anxiety  (panic). 20 tablet 0  . azithromycin (ZITHROMAX) 500 MG tablet Take 1 tablet (500 mg total) by mouth daily. 3 tablet 0  . buPROPion (WELLBUTRIN XL) 300 MG 24 hr tablet Take 1 tablet (300 mg total) by mouth daily. 30 tablet 6  . fish oil-omega-3 fatty acids 1000 MG capsule Take 1 g by mouth daily.      . fluconazole (DIFLUCAN) 150 MG tablet 1 pill today then repeat in 4 days 2 tablet 0  . fluticasone (FLONASE) 50 MCG/ACT nasal spray SPRAY 2 SPRAYS INTO EACH NOSTRIL EVERY DAY 16 g 1  . gabapentin (NEURONTIN) 100 MG capsule TAKE 2 CAPSULES BY MOUTH AT BEDTIME 60 capsule 3  . LYSINE PO Take 1,000 mg by mouth daily. for fever blisters.    . methylphenidate (CONCERTA) 36 MG PO CR tablet Take 1 tablet (36 mg total) by mouth daily. 30 tablet 0  . Norethin-Eth Estrad-Fe Biphas (LO LOESTRIN FE PO) Take by mouth.    . Nutritional Supplements (DHEA PO) Take by mouth.    . spironolactone (ALDACTONE) 25 MG tablet  Take 25 mg by mouth daily.    . SUMAtriptan (IMITREX) 100 MG tablet Take 1 at onset of  Migraine headache   and can repeat in 2 hours . 9 tablet 1  . traMADol (ULTRAM) 50 MG tablet Take 1 tablet (50 mg total) by mouth every 8 (eight) hours as needed. 30 tablet 0  . valACYclovir (VALTREX) 1000 MG tablet TAKE 1 TABLET (1,000 MG TOTAL) BY MOUTH 2 (TWO) TIMES DAILY. 30 tablet 1  . Vitamin D, Ergocalciferol, (DRISDOL) 50000 units CAPS capsule TAKE 1 CAPSULE EVERY 7 DAYS 12 capsule 0   No facility-administered medications prior to visit.      EXAM:  There were no vitals taken for this visit.  There is no height or weight on file to calculate BMI.  GENERAL: vitals reviewed and listed above, alert, oriented, appears well hydrated and in no acute distress HEENT: atraumatic, conjunctiva  clear, no obvious abnormalities on inspection of external nose and ears OP : no lesion edema or exudate  NECK: no obvious masses on inspection palpation  LUNGS: clear to auscultation bilaterally, no wheezes, rales or  rhonchi, good air movement CV: HRRR, no clubbing cyanosis or  peripheral edema nl cap refill  MS: moves all extremities without noticeable focal  abnormality PSYCH: pleasant and cooperative, no obvious depression or anxiety Lab Results  Component Value Date   GLUCOSE 103 (H) 07/12/2015   ALT 20 07/12/2015   AST 20 07/12/2015   NA 137 07/12/2015   K 3.7 07/12/2015   CL 105 07/12/2015   CREATININE 0.99 07/12/2015   BUN 14 07/12/2015   CO2 22 07/12/2015   BP Readings from Last 3 Encounters:  01/31/18 120/72  01/01/18 132/74  12/04/17 98/72    ASSESSMENT AND PLAN:  Discussed the following assessment and plan:  Medication management  Anxiety and depression  Attention deficit hyperactivity disorder (ADHD), unspecified ADHD type  -Patient advised to return or notify health care team  if  new concerns arise.  There are no Patient Instructions on file for this visit.   Neta MendsWanda K. Rainee Sweatt M.D.

## 2018-02-12 ENCOUNTER — Ambulatory Visit: Payer: BLUE CROSS/BLUE SHIELD | Admitting: Internal Medicine

## 2018-02-19 ENCOUNTER — Ambulatory Visit: Payer: Self-pay | Admitting: Psychiatry

## 2018-02-21 ENCOUNTER — Ambulatory Visit: Payer: Self-pay | Admitting: Psychiatry

## 2018-02-25 ENCOUNTER — Other Ambulatory Visit: Payer: Self-pay | Admitting: Internal Medicine

## 2018-02-25 NOTE — Telephone Encounter (Signed)
Copied from CRM [id number]. Topic: Quick Communication - Rx Refill/Question >> Feb 25, 2018  1:23 PM Jaquita Rector A wrote: Medication: methylphenidate (CONCERTA) 36 MG PO CR tablet  Has the patient contacted their pharmacy? Yes (Agent: If no, request that the patient contact the pharmacy for the refill.) (Agent: If yes, when and what did the pharmacy advise?)  Preferred Pharmacy (with phone number or street name): [id number] - [place], [place] - [address] AT [address] [facility] [address] [phone] (Phone) [phone] (Fax)    Agent: Please be advised that RX refills may take up to 3 business days. We ask that you follow-up with your pharmacy.

## 2018-02-26 ENCOUNTER — Ambulatory Visit: Payer: Self-pay | Admitting: Psychiatry

## 2018-02-26 ENCOUNTER — Encounter: Payer: Self-pay | Admitting: Psychiatry

## 2018-02-26 ENCOUNTER — Ambulatory Visit: Payer: BLUE CROSS/BLUE SHIELD | Admitting: Psychiatry

## 2018-02-26 DIAGNOSIS — F4322 Adjustment disorder with anxiety: Secondary | ICD-10-CM | POA: Diagnosis not present

## 2018-02-26 MED ORDER — METHYLPHENIDATE HCL ER (OSM) 36 MG PO TBCR: 36.0000 mg | EXTENDED_RELEASE_TABLET | Freq: Every day | ORAL | 0 refills | Status: DC

## 2018-02-26 NOTE — Telephone Encounter (Signed)
Last filled : 01/24/2018 Last ov: 08/08/17 Upcoming appt:03/11/2018

## 2018-02-26 NOTE — Progress Notes (Signed)
      [facility] Counselor/Therapist Progress Note  Patient ID: Terri [name], MRN: [id number],    Date: 02/26/2018  Time Spent: 50 minutes   Treatment Type: Individual Therapy  Reported Symptoms: Anxious Mood  Mental Status Exam:  Appearance:   Casual and Well Groomed     Behavior:  Appropriate  Motor:  Normal  Speech/Language:   Clear and Coherent  Affect:  Appropriate  Mood:  anxious  Thought process:  normal  Thought content:    WNL  Sensory/Perceptual disturbances:    WNL  Orientation:  oriented to person, place, time/date and situation  Attention:  Good  Concentration:  Good  Memory:  WNL  Fund of knowledge:   Good  Insight:    Good  Judgment:   Good  Impulse Control:  Good   Risk Assessment: Danger to Self:  No Self-injurious Behavior: No Danger to Others: No Duty to Warn:no Physical Aggression / Violence:No  Access to Firearms a concern: No  Gang Involvement:No   Subjective: The client describes this ongoing low level anxiety.  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  She recently hit her 1 year mark.  She has a very steady clientele and her financial stress has been reduced.  She recently met with a new accountant who was able to do her return which resulted in a very minimal payment to the IRS. Today using E MDR we focused on the clients anxiety.  Her subjective units of distress was a 7+.  She noticed it mostly in her stomach.  As we worked through it for the client was able to identify that she was feeling loss.  She is unsure why.  She does not think it is about her family.  So she will continue to try to journal to figure out what is going on.  I asked the client to not give the anxiety more power than it has.  But I do believe that we can continue to work it out.  The client agrees.  Interventions: Solution-Oriented/Positive Psychology, Eye Movement Desensitization and Reprocessing (EMDR) and  Insight-Oriented  Diagnosis:   ICD-10-CM   1. Adjustment disorder with anxiety F43.22     Plan: Journaling, self-care, positive self talk.  Albertina Parr Mahlon Gabri[place], [place]

## 2018-03-06 ENCOUNTER — Ambulatory Visit: Payer: BLUE CROSS/BLUE SHIELD | Admitting: Family Medicine

## 2018-03-08 NOTE — Progress Notes (Signed)
Chief Complaint  Patient presents with  . Follow-up    Pt is here for follow up on med check. Pt states medication is going well pt states she is trying to ween off concerta     HPI: Terri [name] 53 y.o. come in for med management  ADHD  Counseling for anxiety  wellbutrin  Doing   300 mg to help.     Adhd: issues with concerta and was off ofr about 10 days  And.   Felt funny off of it.   But still helps focussing   And performance but want to try to wean and less use  Sleep issues  .    On  Spironolactone  For  Rash face.  Ocd  tendencies.   In counseling  Reports not worse on   and seems better  .    To see gyne about menopause  [poss adding sx  Bruised left middle finger .  Noted this am  Used ice .  ROS: See pertinent positives and negatives per HPI.  Past Medical History:  Diagnosis Date  . Anxiety   . Depression   . Environmental allergies   . GERD (gastroesophageal reflux disease)   . Headache(784.0)   . Recurrent sinusitis    ent and pulm eval in past.    Family History  Problem Relation Age of Onset  . Heart disease Unknown        fhx    Social History   Socioeconomic History  . Marital status: Single    Spouse name: Not on file  . Number of children: Not on file  . Years of education: Not on file  . Highest education level: Not on file  Occupational History  . Not on file  Social Needs  . Financial resource strain: Not on file  . Food insecurity:    Worry: Not on file    Inability: Not on file  . Transportation needs:    Medical: Not on file    Non-medical: Not on file  Tobacco Use  . Smoking status: Never Smoker  . Smokeless tobacco: Never Used  Substance and Sexual Activity  . Alcohol use: Yes    Comment: Occasional use  . Drug use: No  . Sexual activity: Yes    Birth control/protection: Pill  Lifestyle  . Physical activity:    Days per week: Not on file    Minutes per session: Not on file  . Stress: Not on file  Relationships  .  Social connections:    Talks on phone: Not on file    Gets together: Not on file    Attends religious service: Not on file    Active member of club or organization: Not on file    Attends meetings of clubs or organizations: Not on file    Relationship status: Not on file  Other Topics Concern  . 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 ALPRAZolam (XANAX) 0.25 MG tablet Take 1 tablet (0.25 mg total) by mouth 3 (three) times daily as needed for anxiety (panic). 20 tablet 0  . buPROPion (WELLBUTRIN XL) 300 MG 24 hr tablet TAKE 1 TABLET BY MOUTH EVERY DAY 30 tablet 6  . fish oil-omega-3 fatty acids 1000 MG capsule Take 1 g by mouth daily.      . fluconazole (  DIFLUCAN) 150 MG tablet 1 pill today then repeat in 4 days 2 tablet 0  . fluticasone (FLONASE) 50 MCG/ACT nasal spray SPRAY 2 SPRAYS INTO EACH NOSTRIL EVERY DAY 16 g 1  . gabapentin (NEURONTIN) 100 MG capsule TAKE 2 CAPSULES BY MOUTH AT BEDTIME 60 capsule 3  . LYSINE PO Take 1,000 mg by mouth daily. for fever blisters.    . methylphenidate (CONCERTA) 36 MG PO CR tablet Take 1 tablet (36 mg total) by mouth daily. 30 tablet 0  . Norethin-Eth Estrad-Fe Biphas (LO LOESTRIN FE PO) Take by mouth.    . Nutritional Supplements (DHEA PO) Take by mouth.    . spironolactone (ALDACTONE) 25 MG tablet Take 25 mg by mouth daily.    . SUMAtriptan (IMITREX) 100 MG tablet Take 1 at onset of  Migraine headache   and can repeat in 2 hours . 9 tablet 1  . valACYclovir (VALTREX) 1000 MG tablet TAKE 1 TABLET (1,000 MG TOTAL) BY MOUTH 2 (TWO) TIMES DAILY. 30 tablet 1  . Vitamin D, Ergocalciferol, (DRISDOL) 50000 units CAPS capsule TAKE 1 CAPSULE EVERY 7 DAYS 12 capsule 0  . azithromycin (ZITHROMAX) 500 MG tablet Take 1 tablet (500 mg total) by mouth daily. 3 tablet 0  . traMADol (ULTRAM) 50 MG tablet Take 1 tablet (50 mg total) by mouth every 8  (eight) hours as needed. 30 tablet 0   No facility-administered medications prior to visit.      EXAM:  BP 120/68 (BP Location: Right Arm, Patient Position: Sitting, Cuff Size: Normal)   Pulse 94   Temp 98.2 F (36.8 C) (Oral)   Wt 121 lb 6.4 oz (55.1 kg)   LMP 07/19/2017 (Approximate)   BMI 20.84 kg/m   Body mass index is 20.84 kg/m.  GENERAL: vitals reviewed and listed above, alert, oriented, appears well hydrated and in no acute distress HEENT: atraumatic, conjunctiva  clear, no obvious abnormalities on inspection of external nose and ears MS: moves all extremities without noticeable focal  Abnormality left middle finger with ventral bruising at  Pip  PSYCH: pleasant and cooperative, no obvious depression or anxiety Skin clear   BP Readings from Last 3 Encounters:  03/11/18 120/68  01/31/18 120/72  01/01/18 132/74    ASSESSMENT AND PLAN:  Discussed the following assessment and plan:  Attention deficit hyperactivity disorder (ADHD), unspecified ADHD type  Medication management  Injury of finger of left hand, initial encounter  Anxiety and depression - counseling and med seems to help   Menopausal state Risk benefit of medication discussed.  and options  Rare use of alprazolam  Seems to  Have a good result on Wellbutrin  Total visit > 50% spent counseling and coordinating care as indicated in above note and in instructions to patient .  -Patient advised to return or notify health care team  if  new concerns arise.  Patient Instructions  Ok to remain on wellbutrin .  Can use the concerta   Off and on   .  As discussed contact  Use cold on finger and can do buddy tape.  Let  us know if  persistent or progressive.  ? Due for  colon cancer screening .  Contact us  When getting   Need for refill med  Concerta.   Otherwise  6 months  OV  Or no later   med check visit .      [name]. Catie Chiao M.D.

## 2018-03-10 ENCOUNTER — Other Ambulatory Visit: Payer: Self-pay | Admitting: Internal Medicine

## 2018-03-11 ENCOUNTER — Encounter: Payer: Self-pay | Admitting: Internal Medicine

## 2018-03-11 ENCOUNTER — Ambulatory Visit (INDEPENDENT_AMBULATORY_CARE_PROVIDER_SITE_OTHER): Payer: BLUE CROSS/BLUE SHIELD | Admitting: Internal Medicine

## 2018-03-11 VITALS — BP 120/68 | HR 94 | Temp 98.2°F | Wt 121.4 lb

## 2018-03-11 DIAGNOSIS — S6992XA Unspecified injury of left wrist, hand and finger(s), initial encounter: Secondary | ICD-10-CM

## 2018-03-11 DIAGNOSIS — F32A Depression, unspecified: Secondary | ICD-10-CM

## 2018-03-11 DIAGNOSIS — Z79899 Other long term (current) drug therapy: Secondary | ICD-10-CM

## 2018-03-11 DIAGNOSIS — N951 Menopausal and female climacteric states: Secondary | ICD-10-CM

## 2018-03-11 DIAGNOSIS — F909 Attention-deficit hyperactivity disorder, unspecified type: Secondary | ICD-10-CM | POA: Diagnosis not present

## 2018-03-11 DIAGNOSIS — F419 Anxiety disorder, unspecified: Secondary | ICD-10-CM

## 2018-03-11 DIAGNOSIS — F329 Major depressive disorder, single episode, unspecified: Secondary | ICD-10-CM

## 2018-03-11 NOTE — Patient Instructions (Addendum)
Ok to remain on wellbutrin .  Can use the concerta   Off and on   .  As discussed contact  Use cold on finger and can do buddy tape.  Let  us know if  persistent or progressive.  ? Due for  colon cancer screening .  Contact us  When getting   Need for refill med  Concerta.   Otherwise  6 months  OV  Or no later   med check visit .

## 2018-03-14 ENCOUNTER — Ambulatory Visit: Payer: Self-pay | Admitting: Psychiatry

## 2018-03-25 ENCOUNTER — Ambulatory Visit: Payer: BLUE CROSS/BLUE SHIELD | Admitting: Family Medicine

## 2018-03-28 DIAGNOSIS — M5386 Other specified dorsopathies, lumbar region: Secondary | ICD-10-CM | POA: Diagnosis not present

## 2018-03-28 DIAGNOSIS — M9908 Segmental and somatic dysfunction of rib cage: Secondary | ICD-10-CM | POA: Diagnosis not present

## 2018-03-28 DIAGNOSIS — M9903 Segmental and somatic dysfunction of lumbar region: Secondary | ICD-10-CM | POA: Diagnosis not present

## 2018-03-28 DIAGNOSIS — M9902 Segmental and somatic dysfunction of thoracic region: Secondary | ICD-10-CM | POA: Diagnosis not present

## 2018-04-01 ENCOUNTER — Telehealth: Payer: Self-pay | Admitting: Internal Medicine

## 2018-04-01 NOTE — Telephone Encounter (Signed)
Copied from CRM [id number]. Topic: Quick Communication - Rx Refill/Question >> Apr 01, 2018  3:16 PM Maia Petties wrote: Medication: methylphenidate (CONCERTA) 36 MG PO CR tablet - pt called stating she has 2 pills left - takes 1/day  Last OV 03/11/2018 Last filled 02/26/2018 #30 0 refills  Has the patient contacted their pharmacy? Yes - told to contact [facility] Preferred Pharm[facility] (with phone number or street name): [id number] - [place], [place] - [address] AT [facility] [address] [address] [phone] (Phone) [phone] (Fax)

## 2018-04-02 MED ORDER — METHYLPHENIDATE HCL ER (OSM) 36 MG PO TBCR: 36.0000 mg | EXTENDED_RELEASE_TABLET | Freq: Every day | ORAL | 0 refills | Status: DC

## 2018-04-02 NOTE — Telephone Encounter (Signed)
Sent in electronically .  

## 2018-04-03 ENCOUNTER — Encounter: Payer: Self-pay | Admitting: Family Medicine

## 2018-04-03 ENCOUNTER — Other Ambulatory Visit: Payer: Self-pay

## 2018-04-03 ENCOUNTER — Ambulatory Visit: Payer: BLUE CROSS/BLUE SHIELD | Admitting: Family Medicine

## 2018-04-03 VITALS — BP 122/80 | HR 88 | Ht 64.0 in | Wt 124.0 lb

## 2018-04-03 DIAGNOSIS — M940 Chondrocostal junction syndrome [Tietze]: Secondary | ICD-10-CM | POA: Diagnosis not present

## 2018-04-03 DIAGNOSIS — M999 Biomechanical lesion, unspecified: Secondary | ICD-10-CM | POA: Diagnosis not present

## 2018-04-03 NOTE — Progress Notes (Signed)
[name] [address] [place], [place] [address] Phone: [phone] Subjective:   Terri [name], am serving as a scribe for Dr. Antoine Primas.    CC: Back pain follow-up  [id number]  Terri [name] is a 53 y.o. female coming in with complaint of back, foot and elbow pain. Feels that her pain is improving in all areas. Has started pilates.  Patient feels that the Pilates has been very beneficial.  Patient has not been working out as frequently as well and thinks that this is helped with recovery.  Not having as much muscle soreness.     Past Medical History:  Diagnosis Date  . Anxiety   . Depression   . Environmental allergies   . GERD (gastroesophageal reflux disease)   . Headache(784.0)   . Recurrent sinusitis    ent and pulm eval in past.   Past Surgical History:  Procedure Laterality Date  . HIP ARTHROSCOPY W/ LABRAL DEBRIDEMENT    . left hip surgery     [facility]   Social History   Socioeconomic History  . Marital status: Single    Spouse name: Not on file  . Number of children: Not on file  . Years of education: Not on file  . Highest education level: Not on file  Occupational History  . Not on file  Social Needs  . Financial resource strain: Not on file  . Food insecurity:    Worry: Not on file    Inability: Not on file  . Transportation needs:    Medical: Not on file    Non-medical: Not on file  Tobacco Use  . Smoking status: Never Smoker  . Smokeless tobacco: Never Used  Substance and Sexual Activity  . Alcohol use: Yes    Comment: Occasional use  . Drug use: No  . Sexual activity: Yes    Birth control/protection: Pill  Lifestyle  . Physical activity:    Days per week: Not on file    Minutes per session: Not on file  . Stress: Not on file  Relationships  . Social connections:    Talks on phone: Not on file    Gets together: Not on file    Attends religious service: Not on file    Active member of club or  organization: Not on file    Attends meetings of clubs or organizations: Not on file    Relationship status: Not on file  Other Topics Concern  . 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 Augmentin [Amoxicillin-Pot Clavulanate] Nausea Only  . Celecoxib     REACTION: unspecified  . Sulfonamide Derivatives     REACTION: hives, throat swelling  . Monistat [Miconazole] Other (See Comments)    Sensitive to topical vaginal  Azoles No side effects with oral Diflucan   Family History  Problem Relation Age of Onset  . Heart disease Unknown        fhx    Current Outpatient Medications (Endocrine & Metabolic):  [name]  Norethin-Eth Estrad-Fe Biphas (LO LOESTRIN FE PO), Take by mouth.  Current Outpatient Medications (Cardiovascular):  .  spironolactone (ALDACTONE) 25 MG tablet, Take 25 mg by mouth daily.  Current Outpatient Medications (Respiratory):  .  fluticasone (FLONASE) 50 MCG/ACT nasal spray, SPRAY 2 SPRAYS INTO EACH NOSTRIL EVERY DAY  Current Outpatient Medications (Analgesics):  [name]  SUMAtriptan (  IMITREX) 100 MG tablet, Take 1 at onset of  Migraine headache   and can repeat in 2 hours .   Current Outpatient Medications (Other):  [name]  ALPRAZolam (XANAX) 0.25 MG tablet, Take 1 tablet (0.25 mg total) by mouth 3 (three) times daily as needed for anxiety (panic). [name]  buPROPion (WELLBUTRIN XL) 300 MG 24 hr tablet, TAKE 1 TABLET BY MOUTH EVERY DAY .  fish oil-omega-3 fatty acids 1000 MG capsule, Take 1 g by mouth daily.   .  fluconazole (DIFLUCAN) 150 MG tablet, 1 pill today then repeat in 4 days .  gabapentin (NEURONTIN) 100 MG capsule, TAKE 2 CAPSULES BY MOUTH AT BEDTIME .  LYSINE PO, Take 1,000 mg by mouth daily. for fever blisters. .  methylphenidate (CONCERTA) 36 MG PO CR tablet, Take 1 tablet (36 mg total) by mouth daily. .  Nutritional Supplements (DHEA PO)*, Take by mouth. .  valACYclovir (VALTREX) 1000 MG  tablet, TAKE 1 TABLET (1,000 MG TOTAL) BY MOUTH 2 (TWO) TIMES DAILY. [name]  Vitamin D, Ergocalciferol, (DRISDOL) 50000 units CAPS capsule, TAKE 1 CAPSULE EVERY 7 DAYS * These medications belong to multiple therapeutic classes and are listed under each applicable group.    Past medical history, social, surgical and family history all reviewed in electronic medical record.  No pertanent information unless stated regarding to the chief complaint.   Review of Systems:  No headache, visual changes, nausea, vomiting, diarrhea, constipation, dizziness, abdominal pain, skin rash, fevers, chills, night sweats, weight loss, swollen lymph nodes, body aches, joint swelling,  chest pain, shortness of breath, mood changes.  Muscle aches  Objective  Blood pressure 122/80, pulse 88, height 5\' 4"  (1.626 m), weight 124 lb (56.2 kg), last menstrual period 07/19/2017, SpO2 98 %.    General: No apparent distress alert and oriented x3 mood and affect normal, dressed appropriately.  HEENT: Pupils equal, extraocular movements intact  Respiratory: Patient's speak in full sentences and does not appear short of breath  Cardiovascular: No lower extremity edema, non tender, no erythema  Skin: Warm dry intact with no signs of infection or rash on extremities or on axial skeleton.  Abdomen: Soft nontender  Neuro: Cranial nerves II through XII are intact, neurovascularly intact in all extremities with 2+ DTRs and 2+ pulses.  Lymph: No lymphadenopathy of posterior or anterior cervical chain or axillae bilaterally.  Gait normal with good balance and coordination.  MSK:  Non tender with full range of motion and good stability and symmetric strength and tone of shoulders, elbows, wrist, hip, knee and ankles bilaterally.  Hypermobility noted no swelling of any of the joints today. Exam has some loss of lordosis.  Some tightness in flexion lacking the last 5 degrees.  Patient does have tightness of the [facility] test.  Patient does have  hypermobility multiple other joints.  Negative straight leg test.  Continue reflexes intact.  Osteopathic findings  C7 flexed rotated and side bent left T3 extended rotated and side bent right inhaled third rib T11 extended rotated and side bent left L3 flexed rotated and side bent left  Sacrum right on right Left-sided pelvic shear     Impression and Recommendations:     This case required medical decision making of moderate complexity. The above documentation has been reviewed and is accurate and complete Terri Saa, DO       Note: This dictation was prepared with Dragon dictation along with smaller phrase technology. Any transcriptional errors that result from this process are  unintentional.

## 2018-04-03 NOTE — Patient Instructions (Signed)
You are doing great  Don't change a thing  See me again in 6 weeks now that I am affordable  KEEP IT UP!

## 2018-04-03 NOTE — Assessment & Plan Note (Signed)
Decision today to treat with OMT was based on Physical Exam  After verbal consent patient was treated with HVLA, ME, FPR techniques in cervical, thoracic, lumbar and sacral areas  Patient tolerated the procedure well with improvement in symptoms  Patient given exercises, stretches and lifestyle modifications  See medications in patient instructions if given  Patient will follow up in 6-8 weeks 

## 2018-04-03 NOTE — Assessment & Plan Note (Signed)
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  Has responded fairly well to osteopathic manipulation.  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  Patient is still not willing to change management on that.  As long as patient is well follow-up in 6 weeks

## 2018-04-04 ENCOUNTER — Ambulatory Visit: Payer: Self-pay | Admitting: Psychiatry

## 2018-04-08 ENCOUNTER — Ambulatory Visit: Payer: Self-pay | Admitting: Psychiatry

## 2018-04-30 ENCOUNTER — Ambulatory Visit: Payer: Self-pay | Admitting: Psychiatry

## 2018-04-30 ENCOUNTER — Encounter

## 2018-05-13 ENCOUNTER — Other Ambulatory Visit: Payer: Self-pay | Admitting: Internal Medicine

## 2018-05-13 MED ORDER — METHYLPHENIDATE HCL ER (OSM) 36 MG PO TBCR: 36.0000 mg | EXTENDED_RELEASE_TABLET | Freq: Every day | ORAL | 0 refills | Status: DC

## 2018-05-13 NOTE — Telephone Encounter (Signed)
Please advise 

## 2018-05-13 NOTE — Telephone Encounter (Signed)
Sent in electronically .  

## 2018-05-14 NOTE — Progress Notes (Signed)
[name], [place] [address] Phone: [phone] Subjective:   I Ronelle NighKana Thompson am serving as a Neurosurgeonscribe for Dr. Antoine PrimasZachary Holbert Caples.   CC: Back and neck pain follow-up  IEP:[id number]:Subjective  Terri NewtonJulie [name] is a 53 y.o. female coming in with complaint of back pain. States that her foot is getting better. Right elbow is having some issues.  Patient states that some mild increased pain in the back of the neck.  Has not been working.  Has been trying to workout more regularly and did buy a bike.    Past Medical History:  Diagnosis Date  . Anxiety   . Depression   . Environmental allergies   . GERD (gastroesophageal reflux disease)   . Headache(784.0)   . Recurrent sinusitis    ent and pulm eval in past.   Past Surgical History:  Procedure Laterality Date  . HIP ARTHROSCOPY W/ LABRAL DEBRIDEMENT    . left hip surgery     [facility]   Social History   Socioeconomic History  . Marital status: Single    Spouse name: Not on file  . Number of children: Not on file  . Years of education: Not on file  . Highest education level: Not on file  Occupational History  . Not on file  Social Needs  . Financial resource strain: Not on file  . Food insecurity:    Worry: Not on file    Inability: Not on file  . Transportation needs:    Medical: Not on file    Non-medical: Not on file  Tobacco Use  . Smoking status: Never Smoker  . Smokeless tobacco: Never Used  Substance and Sexual Activity  . Alcohol use: Yes    Comment: Occasional use  . Drug use: No  . Sexual activity: Yes    Birth control/protection: Pill  Lifestyle  . Physical activity:    Days per week: Not on file    Minutes per session: Not on file  . Stress: Not on file  Relationships  . Social connections:    Talks on phone: Not on file    Gets together: Not on file    Attends religious service: Not on file    Active member of club or organization: Not on file    Attends meetings  of clubs or organizations: Not on file    Relationship status: Not on file  Other Topics Concern  . 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 Augmentin [Amoxicillin-Pot Clavulanate] Nausea Only  . Celecoxib     REACTION: unspecified  . Sulfonamide Derivatives     REACTION: hives, throat swelling  . Monistat [Miconazole] Other (See Comments)    Sensitive to topical vaginal  Azoles No side effects with oral Diflucan   Family History  Problem Relation Age of Onset  . Heart disease Unknown        fhx    Current Outpatient Medications (Endocrine & Metabolic):  [name].  Norethin-Eth Estrad-Fe Biphas (LO LOESTRIN FE PO), Take by mouth.  Current Outpatient Medications (Cardiovascular):  .  spironolactone (ALDACTONE) 25 MG tablet, Take 25 mg by mouth daily.  Current Outpatient Medications (Respiratory):  .  fluticasone (FLONASE) 50 MCG/ACT nasal spray, SPRAY 2 SPRAYS INTO EACH NOSTRIL EVERY DAY  Current Outpatient Medications (Analgesics):  [name].  SUMAtriptan (IMITREX) 100 MG tablet, Take 1  at onset of  Migraine headache   and can repeat in 2 hours .   Current Outpatient Medications (Other):  [name]  ALPRAZolam (XANAX) 0.25 MG tablet, Take 1 tablet (0.25 mg total) by mouth 3 (three) times daily as needed for anxiety (panic). [name]  buPROPion (WELLBUTRIN XL) 300 MG 24 hr tablet, TAKE 1 TABLET BY MOUTH EVERY DAY .  fish oil-omega-3 fatty acids 1000 MG capsule, Take 1 g by mouth daily.   .  fluconazole (DIFLUCAN) 150 MG tablet, 1 pill today then repeat in 4 days .  gabapentin (NEURONTIN) 100 MG capsule, TAKE 2 CAPSULES BY MOUTH AT BEDTIME .  LYSINE PO, Take 1,000 mg by mouth daily. for fever blisters. .  methylphenidate (CONCERTA) 36 MG PO CR tablet, Take 1 tablet (36 mg total) by mouth daily. .  Nutritional Supplements (DHEA PO)*, Take by mouth. .  valACYclovir (VALTREX) 1000 MG tablet, TAKE 1 TABLET (1,000 MG TOTAL) BY MOUTH 2  (TWO) TIMES DAILY. [name]  Vitamin D, Ergocalciferol, (DRISDOL) 50000 units CAPS capsule, TAKE 1 CAPSULE EVERY 7 DAYS * These medications belong to multiple therapeutic classes and are listed under each applicable group.    Past medical history, social, surgical and family history all reviewed in electronic medical record.  No pertanent information unless stated regarding to the chief complaint.   Review of Systems:  No headache, visual changes, nausea, vomiting, diarrhea, constipation, dizziness, abdominal pain, skin rash, fevers, chills, night sweats, weight loss, swollen lymph nodes, body aches, joint swelling, , chest pain, shortness of breath, mood changes.  Positive muscle aches  Objective  Last menstrual period 07/19/2017.   General: No apparent distress alert and oriented x3 mood and affect normal, dressed appropriately.  HEENT: Pupils equal, extraocular movements intact  Respiratory: Patient's speak in full sentences and does not appear short of breath  Cardiovascular: No lower extremity edema, non tender, no erythema  Skin: Warm dry intact with no signs of infection or rash on extremities or on axial skeleton.  Abdomen: Soft nontender  Neuro: Cranial nerves II through XII are intact, neurovascularly intact in all extremities with 2+ DTRs and 2+ pulses.  Lymph: No lymphadenopathy of posterior or anterior cervical chain or axillae bilaterally.  Gait normal with good balance and coordination.  MSK:  tender with full range of motion and good stability and symmetric strength and tone of shoulders, elbows, wrist, hip, knee and ankles bilaterally.  Hypermobility noted  Neck exam has some mild loss of lordosis.  Otherwise full range of motion.  No crepitus noted.  Tenderness to palpation over the paraspinal musculature of the cervical and thoracic spine on the right side  Osteopathic findings C2 flexed rotated and side bent right C6 flexed rotated and side bent left T3 extended rotated and  side bent right inhaled third rib T9 extended rotated and side bent left L2 flexed rotated and side bent right Sacrum right on right Pelvic shear noted   Impression and Recommendations:     This case required medical decision making of moderate complexity. The above documentation has been reviewed and is accurate and complete Terri Saa, DO       Note: This dictation was prepared with Dragon dictation along with smaller phrase technology. Any transcriptional errors that result from this process are unintentional.

## 2018-05-15 ENCOUNTER — Other Ambulatory Visit: Payer: Self-pay | Admitting: Internal Medicine

## 2018-05-15 ENCOUNTER — Encounter: Payer: Self-pay | Admitting: Family Medicine

## 2018-05-15 ENCOUNTER — Ambulatory Visit: Payer: BLUE CROSS/BLUE SHIELD | Admitting: Family Medicine

## 2018-05-15 ENCOUNTER — Other Ambulatory Visit: Payer: Self-pay

## 2018-05-15 VITALS — BP 122/74 | HR 94 | Ht 64.0 in | Wt 124.0 lb

## 2018-05-15 DIAGNOSIS — M999 Biomechanical lesion, unspecified: Secondary | ICD-10-CM | POA: Diagnosis not present

## 2018-05-15 DIAGNOSIS — L309 Dermatitis, unspecified: Secondary | ICD-10-CM | POA: Diagnosis not present

## 2018-05-15 DIAGNOSIS — M542 Cervicalgia: Secondary | ICD-10-CM | POA: Diagnosis not present

## 2018-05-15 DIAGNOSIS — L821 Other seborrheic keratosis: Secondary | ICD-10-CM | POA: Diagnosis not present

## 2018-05-15 NOTE — Assessment & Plan Note (Signed)
Decision today to treat with OMT was based on Physical Exam  After verbal consent patient was treated with HVLA, ME, FPR techniques in cervical, thoracic, rib, lumbar and sacral areas  Patient tolerated the procedure well with improvement in symptoms  Patient given exercises, stretches and lifestyle modifications  See medications in patient instructions if given  Patient will follow up in 4-6 weeks 

## 2018-05-15 NOTE — Assessment & Plan Note (Signed)
More likely postural and ergonomics.  We discussed with patient in great length.  We will continue to monitor.  Has responded well to manipulation.  Discussed follow-up again 4 weeks.  No significant changes in management.

## 2018-05-21 ENCOUNTER — Ambulatory Visit: Payer: BLUE CROSS/BLUE SHIELD | Admitting: Psychiatry

## 2018-05-21 ENCOUNTER — Encounter: Payer: Self-pay | Admitting: Psychiatry

## 2018-05-21 ENCOUNTER — Other Ambulatory Visit: Payer: Self-pay

## 2018-05-21 ENCOUNTER — Ambulatory Visit (INDEPENDENT_AMBULATORY_CARE_PROVIDER_SITE_OTHER): Payer: BLUE CROSS/BLUE SHIELD | Admitting: Psychiatry

## 2018-05-21 DIAGNOSIS — F4322 Adjustment disorder with anxiety: Secondary | ICD-10-CM

## 2018-05-21 NOTE — Progress Notes (Signed)
      [facility] Counselor/Therapist Progress Note  Patient ID: Terri [name], MRN: [id number],    Date: 05/21/2018  Time Spent: 50 minutes   Treatment Type: Individual Therapy  Reported Symptoms: anxiety, stress  Mental Status Exam:  Appearance:   Casual and Well Groomed     Behavior:  Appropriate  Motor:  Normal  Speech/Language:   Clear and Coherent  Affect:  Appropriate  Mood:  anxious  Thought process:  normal  Thought content:    WNL  Sensory/Perceptual disturbances:    WNL  Orientation:  oriented to person, place, time/date and situation  Attention:  Good  Concentration:  Good  Memory:  WNL  Fund of knowledge:   Good  Insight:    Good  Judgment:   Good  Impulse Control:  Good   Risk Assessment: Danger to Self:  No Self-injurious Behavior: No Danger to Others: No Duty to Warn:no Physical Aggression / Violence:No  Access to Firearms a concern: No  Gang Involvement:No   Subjective: I connected with patient by a video enabled telemedicine application or telephone, with their informed consent, and verified patient privacy and that I am speaking with the correct person using two identifiers.  I was located at [facility] and patient at home. "I have been out of work since March 25.  I am not eligible for any government assistance."  The client has been sheltering in place since the governor announced the general lockdown.  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  She has been living off of her savings hoping that the reopening of the economy phase 2 will include her in the next month. Client has been very worried about her 55 year old mother who lives in [place], [place].  She went down and made sure she had groceries and was staying safe during the sheltering place order.  Her older sister who has an autoimmune disorder is currently in remission from leukemia.  Client is worried about  her exposure as well.  One positive aspect has been her niece relocating to [place]. The client has recently been able to get a bicycle and has found that it is better for her than walking.  She has been trying to get out into the sun as much as possible.  She has some appropriate interaction with people in her condominium complex.  The client is focusing on being mindful and trying not to go too far into the future.  She is also being more assertive in contacting friends and keeping up with them via video chats.  She will continue her self-care and engagement in pleasurable activities.    Interventions: Mindfulness Meditation, Solution-Oriented/Positive Psychology and Insight-Oriented  Diagnosis:   ICD-10-CM   1. Adjustment disorder with anxiety F43.22     Plan: Exercise, mindfulness, boundaries, positive self talk.  This record has been created using [employer].  Chart creation errors have been sought, but Terri [name] not always have been located and corrected. Such creation errors do not reflect on the standard of medical care.  Terri [name], [facility]

## 2018-05-31 DIAGNOSIS — M9903 Segmental and somatic dysfunction of lumbar region: Secondary | ICD-10-CM | POA: Diagnosis not present

## 2018-05-31 DIAGNOSIS — M5386 Other specified dorsopathies, lumbar region: Secondary | ICD-10-CM | POA: Diagnosis not present

## 2018-05-31 DIAGNOSIS — M9908 Segmental and somatic dysfunction of rib cage: Secondary | ICD-10-CM | POA: Diagnosis not present

## 2018-05-31 DIAGNOSIS — M9902 Segmental and somatic dysfunction of thoracic region: Secondary | ICD-10-CM | POA: Diagnosis not present

## 2018-06-10 ENCOUNTER — Ambulatory Visit: Payer: BLUE CROSS/BLUE SHIELD | Admitting: Family Medicine

## 2018-06-11 ENCOUNTER — Ambulatory Visit: Payer: BLUE CROSS/BLUE SHIELD | Admitting: Psychiatry

## 2018-06-13 ENCOUNTER — Other Ambulatory Visit: Payer: Self-pay | Admitting: Family Medicine

## 2018-06-19 ENCOUNTER — Ambulatory Visit: Payer: BLUE CROSS/BLUE SHIELD | Admitting: Family Medicine

## 2018-06-24 ENCOUNTER — Telehealth: Payer: Self-pay | Admitting: Internal Medicine

## 2018-06-24 NOTE — Telephone Encounter (Signed)
Copied from CRM [id number]. Topic: Quick Communication - Rx Refill/Question >> Jun 24, 2018  2:05 PM Randol Kern wrote: Medication: methylphenidate (CONCERTA) 36 MG PO CR tablet [[id number]]  Has the patient contacted their pharmacy? Yes  (Agent: If no, request that the patient contact the pharmacy for the refill.) (Agent: If yes, when and what did the pharmacy advise?)  Preferred Pharmacy (with phone number or street name): [id number] [place], [place] - [address] AT [address] [facility] [address] [place] [address] Phone: [phone] Fax: [phone]    Agent: Please be advised that RX refills may take up to 3 business days. We ask that you follow-up with your pharmacy.

## 2018-06-25 MED ORDER — METHYLPHENIDATE HCL ER (OSM) 36 MG PO TBCR: 36.0000 mg | EXTENDED_RELEASE_TABLET | Freq: Every day | ORAL | 0 refills | Status: DC

## 2018-06-25 NOTE — Telephone Encounter (Signed)
Done

## 2018-07-04 DIAGNOSIS — M5386 Other specified dorsopathies, lumbar region: Secondary | ICD-10-CM | POA: Diagnosis not present

## 2018-07-04 DIAGNOSIS — M9902 Segmental and somatic dysfunction of thoracic region: Secondary | ICD-10-CM | POA: Diagnosis not present

## 2018-07-04 DIAGNOSIS — M9903 Segmental and somatic dysfunction of lumbar region: Secondary | ICD-10-CM | POA: Diagnosis not present

## 2018-07-04 DIAGNOSIS — M9908 Segmental and somatic dysfunction of rib cage: Secondary | ICD-10-CM | POA: Diagnosis not present

## 2018-07-05 DIAGNOSIS — N390 Urinary tract infection, site not specified: Secondary | ICD-10-CM | POA: Diagnosis not present

## 2018-07-05 DIAGNOSIS — Z6822 Body mass index (BMI) 22.0-22.9, adult: Secondary | ICD-10-CM | POA: Diagnosis not present

## 2018-07-05 DIAGNOSIS — R399 Unspecified symptoms and signs involving the genitourinary system: Secondary | ICD-10-CM | POA: Diagnosis not present

## 2018-07-05 DIAGNOSIS — Z01419 Encounter for gynecological examination (general) (routine) without abnormal findings: Secondary | ICD-10-CM | POA: Diagnosis not present

## 2018-07-08 DIAGNOSIS — M9908 Segmental and somatic dysfunction of rib cage: Secondary | ICD-10-CM | POA: Diagnosis not present

## 2018-07-08 DIAGNOSIS — M5386 Other specified dorsopathies, lumbar region: Secondary | ICD-10-CM | POA: Diagnosis not present

## 2018-07-08 DIAGNOSIS — M9902 Segmental and somatic dysfunction of thoracic region: Secondary | ICD-10-CM | POA: Diagnosis not present

## 2018-07-08 DIAGNOSIS — M9903 Segmental and somatic dysfunction of lumbar region: Secondary | ICD-10-CM | POA: Diagnosis not present

## 2018-07-09 DIAGNOSIS — H04123 Dry eye syndrome of bilateral lacrimal glands: Secondary | ICD-10-CM | POA: Diagnosis not present

## 2018-07-11 DIAGNOSIS — M9908 Segmental and somatic dysfunction of rib cage: Secondary | ICD-10-CM | POA: Diagnosis not present

## 2018-07-11 DIAGNOSIS — M9903 Segmental and somatic dysfunction of lumbar region: Secondary | ICD-10-CM | POA: Diagnosis not present

## 2018-07-11 DIAGNOSIS — M5386 Other specified dorsopathies, lumbar region: Secondary | ICD-10-CM | POA: Diagnosis not present

## 2018-07-11 DIAGNOSIS — M9902 Segmental and somatic dysfunction of thoracic region: Secondary | ICD-10-CM | POA: Diagnosis not present

## 2018-07-15 DIAGNOSIS — M5386 Other specified dorsopathies, lumbar region: Secondary | ICD-10-CM | POA: Diagnosis not present

## 2018-07-15 DIAGNOSIS — M9902 Segmental and somatic dysfunction of thoracic region: Secondary | ICD-10-CM | POA: Diagnosis not present

## 2018-07-15 DIAGNOSIS — M9903 Segmental and somatic dysfunction of lumbar region: Secondary | ICD-10-CM | POA: Diagnosis not present

## 2018-07-15 DIAGNOSIS — M9908 Segmental and somatic dysfunction of rib cage: Secondary | ICD-10-CM | POA: Diagnosis not present

## 2018-07-18 DIAGNOSIS — M9908 Segmental and somatic dysfunction of rib cage: Secondary | ICD-10-CM | POA: Diagnosis not present

## 2018-07-18 DIAGNOSIS — M9902 Segmental and somatic dysfunction of thoracic region: Secondary | ICD-10-CM | POA: Diagnosis not present

## 2018-07-18 DIAGNOSIS — M5386 Other specified dorsopathies, lumbar region: Secondary | ICD-10-CM | POA: Diagnosis not present

## 2018-07-18 DIAGNOSIS — M9903 Segmental and somatic dysfunction of lumbar region: Secondary | ICD-10-CM | POA: Diagnosis not present

## 2018-07-25 ENCOUNTER — Telehealth: Payer: Self-pay | Admitting: Internal Medicine

## 2018-07-25 NOTE — Telephone Encounter (Signed)
Medication Refill - Medication: methylphenidate (CONCERTA) 36 MG PO CR tablet    Has the patient contacted their pharmacy? Yes.   (Agent: If no, request that the patient contact the pharmacy for the refill.) (Agent: If yes, when and what did the pharmacy advise?)  Preferred Pharmacy (with phone number or street name): [id number] [place], [place]  Agent: Please be advised that RX refills may take up to 3 business days. We ask that you follow-up with your pharmacy.

## 2018-07-26 MED ORDER — METHYLPHENIDATE HCL ER (OSM) 36 MG PO TBCR: 36.0000 mg | EXTENDED_RELEASE_TABLET | Freq: Every day | ORAL | 0 refills | Status: DC

## 2018-07-26 NOTE — Telephone Encounter (Signed)
Sent in electronically . I corrected the pharmacy

## 2018-08-06 DIAGNOSIS — M5386 Other specified dorsopathies, lumbar region: Secondary | ICD-10-CM | POA: Diagnosis not present

## 2018-08-06 DIAGNOSIS — M9902 Segmental and somatic dysfunction of thoracic region: Secondary | ICD-10-CM | POA: Diagnosis not present

## 2018-08-06 DIAGNOSIS — M9903 Segmental and somatic dysfunction of lumbar region: Secondary | ICD-10-CM | POA: Diagnosis not present

## 2018-08-06 DIAGNOSIS — M9908 Segmental and somatic dysfunction of rib cage: Secondary | ICD-10-CM | POA: Diagnosis not present

## 2018-08-08 DIAGNOSIS — M5386 Other specified dorsopathies, lumbar region: Secondary | ICD-10-CM | POA: Diagnosis not present

## 2018-08-08 DIAGNOSIS — M9908 Segmental and somatic dysfunction of rib cage: Secondary | ICD-10-CM | POA: Diagnosis not present

## 2018-08-08 DIAGNOSIS — M9902 Segmental and somatic dysfunction of thoracic region: Secondary | ICD-10-CM | POA: Diagnosis not present

## 2018-08-08 DIAGNOSIS — M9903 Segmental and somatic dysfunction of lumbar region: Secondary | ICD-10-CM | POA: Diagnosis not present

## 2018-08-13 DIAGNOSIS — M9903 Segmental and somatic dysfunction of lumbar region: Secondary | ICD-10-CM | POA: Diagnosis not present

## 2018-08-13 DIAGNOSIS — M9902 Segmental and somatic dysfunction of thoracic region: Secondary | ICD-10-CM | POA: Diagnosis not present

## 2018-08-13 DIAGNOSIS — M5386 Other specified dorsopathies, lumbar region: Secondary | ICD-10-CM | POA: Diagnosis not present

## 2018-08-13 DIAGNOSIS — M9908 Segmental and somatic dysfunction of rib cage: Secondary | ICD-10-CM | POA: Diagnosis not present

## 2018-08-14 DIAGNOSIS — M5386 Other specified dorsopathies, lumbar region: Secondary | ICD-10-CM | POA: Diagnosis not present

## 2018-08-14 DIAGNOSIS — M9902 Segmental and somatic dysfunction of thoracic region: Secondary | ICD-10-CM | POA: Diagnosis not present

## 2018-08-14 DIAGNOSIS — M9903 Segmental and somatic dysfunction of lumbar region: Secondary | ICD-10-CM | POA: Diagnosis not present

## 2018-08-14 DIAGNOSIS — M9908 Segmental and somatic dysfunction of rib cage: Secondary | ICD-10-CM | POA: Diagnosis not present

## 2018-08-20 DIAGNOSIS — M5386 Other specified dorsopathies, lumbar region: Secondary | ICD-10-CM | POA: Diagnosis not present

## 2018-08-20 DIAGNOSIS — M9903 Segmental and somatic dysfunction of lumbar region: Secondary | ICD-10-CM | POA: Diagnosis not present

## 2018-08-20 DIAGNOSIS — M9908 Segmental and somatic dysfunction of rib cage: Secondary | ICD-10-CM | POA: Diagnosis not present

## 2018-08-20 DIAGNOSIS — M9902 Segmental and somatic dysfunction of thoracic region: Secondary | ICD-10-CM | POA: Diagnosis not present

## 2018-08-26 ENCOUNTER — Encounter: Payer: Self-pay | Admitting: Psychiatry

## 2018-08-26 ENCOUNTER — Ambulatory Visit (INDEPENDENT_AMBULATORY_CARE_PROVIDER_SITE_OTHER): Payer: BC Managed Care – PPO | Admitting: Psychiatry

## 2018-08-26 ENCOUNTER — Other Ambulatory Visit: Payer: Self-pay

## 2018-08-26 DIAGNOSIS — M5386 Other specified dorsopathies, lumbar region: Secondary | ICD-10-CM | POA: Diagnosis not present

## 2018-08-26 DIAGNOSIS — F4323 Adjustment disorder with mixed anxiety and depressed mood: Secondary | ICD-10-CM | POA: Diagnosis not present

## 2018-08-26 DIAGNOSIS — M9908 Segmental and somatic dysfunction of rib cage: Secondary | ICD-10-CM | POA: Diagnosis not present

## 2018-08-26 DIAGNOSIS — M9902 Segmental and somatic dysfunction of thoracic region: Secondary | ICD-10-CM | POA: Diagnosis not present

## 2018-08-26 DIAGNOSIS — M9903 Segmental and somatic dysfunction of lumbar region: Secondary | ICD-10-CM | POA: Diagnosis not present

## 2018-08-26 NOTE — Progress Notes (Signed)
      [facility] Counselor/Therapist Progress Note  Patient ID: Terri [name], MRN: [id number],    Date: 08/26/2018  Time Spent: 50 minutes   Treatment Type: Individual Therapy  Reported Symptoms: sad, anxious.  Mental Status Exam:  Appearance:   Casual     Behavior:  Appropriate  Motor:  Normal  Speech/Language:   Clear and Coherent  Affect:  Appropriate  Mood:  anxious and sad  Thought process:  normal  Thought content:    WNL  Sensory/Perceptual disturbances:    WNL  Orientation:  oriented to person, place, time/date and situation  Attention:  Good  Concentration:  Good  Memory:  WNL  Fund of knowledge:   Good  Insight:    Good  Judgment:   Good  Impulse Control:  Good   Risk Assessment: Danger to Self:  No Self-injurious Behavior: No Danger to Others: No Duty to Warn:no Physical Aggression / Violence:No  Access to Firearms a concern: No  Gang Involvement:No   Subjective: The client states that she has a lot of underlying stress.  She has not been sleeping well.  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 The client has tried to keep herself insulated from others so that she does not transmit the COVID virus to her mother who is 44.  She periodically sees her mother to help her out and does not want to transmit the virus to her. "I find myself more depressed."  The client typically walks 5 miles a day and has been unable to do so due to the pandemic and to the Morton's neuroma on her foot.  We discussed other options with what she could do.  She has a bicycle but has found it to be too hot to bike outside.  She had visited [place] for a friend's birthday and ended up getting heatstroke.  "This is made me very cautious of the heat."  I suggested to the client that she purchase a bicycle trainer that she  can place her bicycle on to work out in her condominium.  Client thought this was a great idea and will look into it.  I asked her to exercise at least 3 times a week but she told me she would do it daily.  We discussed that this could help her sleep significantly.  The client will talk to her primary care provider about medications to help with sleep for the time being.  Interventions: Motivational Interviewing, Solution-Oriented/Positive Psychology, [facility] Group Desensitization and Reprocessing (EMDR) and Insight-Oriented  Diagnosis:   ICD-10-CM   1. Adjustment disorder with mixed anxiety and depressed mood  F43.23     Plan: Control and instrumentation engineer, meet with PCP, self-care, positive self talk.  This record has been created using [employer].  Chart creation errors have been sought, but Wylie Coon not always have been located and corrected. Such creation errors do not reflect on the standard of medical care.  Leannah Guse, [facility]

## 2018-08-30 DIAGNOSIS — H1045 Other chronic allergic conjunctivitis: Secondary | ICD-10-CM | POA: Diagnosis not present

## 2018-09-03 DIAGNOSIS — M9902 Segmental and somatic dysfunction of thoracic region: Secondary | ICD-10-CM | POA: Diagnosis not present

## 2018-09-03 DIAGNOSIS — M9903 Segmental and somatic dysfunction of lumbar region: Secondary | ICD-10-CM | POA: Diagnosis not present

## 2018-09-03 DIAGNOSIS — M5386 Other specified dorsopathies, lumbar region: Secondary | ICD-10-CM | POA: Diagnosis not present

## 2018-09-03 DIAGNOSIS — M9908 Segmental and somatic dysfunction of rib cage: Secondary | ICD-10-CM | POA: Diagnosis not present

## 2018-09-04 ENCOUNTER — Telehealth: Payer: Self-pay | Admitting: Internal Medicine

## 2018-09-04 MED ORDER — METHYLPHENIDATE HCL ER (OSM) 36 MG PO TBCR: 36.0000 mg | EXTENDED_RELEASE_TABLET | Freq: Every day | ORAL | 0 refills | Status: DC

## 2018-09-04 NOTE — Telephone Encounter (Signed)
Sent in electronically . Needs OV or virtual visit med  Check before further refills

## 2018-09-04 NOTE — Telephone Encounter (Signed)
See note

## 2018-09-04 NOTE — Telephone Encounter (Signed)
Patient requesting methylphenidate (CONCERTA) 36 MG PO CR tablet, informed please allow 48 to 72 hour turn around time  [id number] [place], [place] - [address] [phone] (Phone) [phone] (Fax)

## 2018-09-04 NOTE — Telephone Encounter (Signed)
Last ov:03/11/2018 Last refilled:07/26/2018

## 2018-09-09 NOTE — Telephone Encounter (Signed)
Patient would like methylphenidate (CONCERTA) 36 MG PO CR tablet, please send to   [id number] - [place], [place] - [address]. [address] [phone] (Phone) [phone] (Fax)   *patient states please don't send any medication to  [id number] - [place], [place]

## 2018-09-10 DIAGNOSIS — M5386 Other specified dorsopathies, lumbar region: Secondary | ICD-10-CM | POA: Diagnosis not present

## 2018-09-10 DIAGNOSIS — M9908 Segmental and somatic dysfunction of rib cage: Secondary | ICD-10-CM | POA: Diagnosis not present

## 2018-09-10 DIAGNOSIS — M9903 Segmental and somatic dysfunction of lumbar region: Secondary | ICD-10-CM | POA: Diagnosis not present

## 2018-09-10 DIAGNOSIS — M9902 Segmental and somatic dysfunction of thoracic region: Secondary | ICD-10-CM | POA: Diagnosis not present

## 2018-09-10 MED ORDER — METHYLPHENIDATE HCL ER (OSM) 36 MG PO TBCR: 36.0000 mg | EXTENDED_RELEASE_TABLET | Freq: Every day | ORAL | 0 refills | Status: DC

## 2018-09-10 NOTE — Telephone Encounter (Signed)
Done

## 2018-09-24 NOTE — Progress Notes (Signed)
[name] [address] [place], [place] [address] Phone: [phone] Subjective:   I Terri [name] am serving as a Neurosurgeon for Dr. Antoine Primas.   CC: Pain, hip pain, foot pain  URK:[id number]  Terri [name] is a 53 y.o. female coming in with complaint of back pain. States that she feels like her neuroma is not getting any better. Better than before.  Patient has been working on a more regular basis.  Has been doing the home exercises intermittently.  Has been getting manipulation by chiropractor.  Patient is going to be fitted for new orthotics soon for her foot pain.  Noticing more left hip pain.  Seems to be worsening.  Past medical history significant for labral pathology previously that did need surgical intervention.       Past Medical History:  Diagnosis Date  . Anxiety   . Depression   . Environmental allergies   . GERD (gastroesophageal reflux disease)   . Headache(784.0)   . Recurrent sinusitis    ent and pulm eval in past.   Past Surgical History:  Procedure Laterality Date  . HIP ARTHROSCOPY W/ LABRAL DEBRIDEMENT    . left hip surgery     [facility]   Social History   Socioeconomic History  . Marital status: Single    Spouse name: Not on file  . Number of children: Not on file  . Years of education: Not on file  . Highest education level: Not on file  Occupational History  . Not on file  Social Needs  . Financial resource strain: Not on file  . Food insecurity    Worry: Not on file    Inability: Not on file  . Transportation needs    Medical: Not on file    Non-medical: Not on file  Tobacco Use  . Smoking status: Never Smoker  . Smokeless tobacco: Never Used  Substance and Sexual Activity  . Alcohol use: Yes    Comment: Occasional use  . Drug use: No  . Sexual activity: Yes    Birth control/protection: Pill  Lifestyle  . Physical activity    Days per week: Not on file    Minutes per session: Not on file  . Stress:  Not on file  Relationships  . Social Musician on phone: Not on file    Gets together: Not on file    Attends religious service: Not on file    Active member of club or organization: Not on file    Attends meetings of clubs or organizations: Not on file    Relationship status: Not on file  Other Topics Concern  . 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 Augmentin [Amoxicillin-Pot Clavulanate] Nausea Only  . Celecoxib     REACTION: unspecified  . Sulfonamide Derivatives     REACTION: hives, throat swelling  . Monistat [Miconazole] Other (See Comments)    Sensitive to topical vaginal  Azoles No side effects with oral Diflucan   Family History  Problem Relation Age of Onset  . Heart disease Unknown        fhx    Current Outpatient Medications (Endocrine & Metabolic):  [name]  Norethin-Eth Estrad-Fe Biphas (LO LOESTRIN FE PO), Take by mouth.  Current Outpatient Medications (Cardiovascular):  .  spironolactone (ALDACTONE) 25 MG tablet, Take 25 mg by mouth  daily.  Current Outpatient Medications (Respiratory):  .  fluticasone (FLONASE) 50 MCG/ACT nasal spray, SPRAY 2 SPRAYS INTO EACH NOSTRIL EVERY DAY  Current Outpatient Medications (Analgesics):  [name]  SUMAtriptan (IMITREX) 100 MG tablet, Take 1 at onset of  Migraine headache   and can repeat in 2 hours .   Current Outpatient Medications (Other):  [name]  ALPRAZolam (XANAX) 0.25 MG tablet, Take 1 tablet (0.25 mg total) by mouth 3 (three) times daily as needed for anxiety (panic). [name]  buPROPion (WELLBUTRIN XL) 150 MG 24 hr tablet, TAKE 2 TABLETS (300 MG TOTAL) BY MOUTH DAILY. [name]  buPROPion (WELLBUTRIN XL) 300 MG 24 hr tablet, TAKE 1 TABLET BY MOUTH EVERY DAY .  fish oil-omega-3 fatty acids 1000 MG capsule, Take 1 g by mouth daily.   .  fluconazole (DIFLUCAN) 150 MG tablet, 1 pill today then repeat in 4 days .  gabapentin (NEURONTIN) 100 MG capsule, TAKE 2  CAPSULES BY MOUTH AT BEDTIME .  LYSINE PO, Take 1,000 mg by mouth daily. for fever blisters. .  methylphenidate (CONCERTA) 36 MG PO CR tablet, Take 1 tablet (36 mg total) by mouth daily. .  Nutritional Supplements (DHEA PO)*, Take by mouth. .  valACYclovir (VALTREX) 1000 MG tablet, TAKE 1 TABLET (1,000 MG TOTAL) BY MOUTH 2 (TWO) TIMES DAILY. [name]  Vitamin D, Ergocalciferol, (DRISDOL) 50000 units CAPS capsule, TAKE 1 CAPSULE EVERY 7 DAYS * These medications belong to multiple therapeutic classes and are listed under each applicable group.    Past medical history, social, surgical and family history all reviewed in electronic medical record.  No pertanent information unless stated regarding to the chief complaint.   Review of Systems:  No headache, visual changes, nausea, vomiting, diarrhea, constipation, dizziness, abdominal pain, skin rash, fevers, chills, night sweats, weight loss, swollen lymph nodes, body aches, joint swelling,  chest pain, shortness of breath, mood changes.  Hypermobility of multiple joints  Objective  Blood pressure 124/72, pulse 97, height 5\' 4"  (1.626 m), weight 123 lb (55.8 kg), last menstrual period 07/19/2017, SpO2 98 %.   General: No apparent distress alert and oriented x3 mood and affect normal, dressed appropriately.  HEENT: Pupils equal, extraocular movements intact  Respiratory: Patient's speak in full sentences and does not appear short of breath  Cardiovascular: No lower extremity edema, non tender, no erythema  Skin: Warm dry intact with no signs of infection or rash on extremities or on axial skeleton.  Abdomen: Soft nontender  Neuro: Cranial nerves II through XII are intact, neurovascularly intact in all extremities with 2+ DTRs and 2+ pulses.  Lymph: No lymphadenopathy of posterior or anterior cervical chain or axillae bilaterally.  Gait normal with good balance and coordination.  MSK:  tender with full range of motion and good stability and symmetric  strength and tone of shoulders, elbows, wrist,  knee and ankles bilaterally.  Very mild synovitis of this MCP in the MTPs bilaterally Left hip exam shows some decreased range of motion. Positive grind test noted.  Weakness of hip abductor compared to the contralateral side.  He weakness of the hip flexor as well   Foot exam shows the patient is a breakdown of the transverse arch bilaterally right greater than left  Positive squeeze test noted on the right.  Limited musculoskeletal ultrasound was performed and interpreted by Lyndal Pulley  Limited ultrasound of patient's right foot shows the patient does have a neuroma.  Osteopathic findings C4 flexed rotated and side bent left C6 flexed  rotated and side bent left T3 extended rotated and side bent right inhaled third rib T7 extended rotated and side bent left L2 flexed rotated and side bent right Sacrum right on right Pelvic shear left     Impression and Recommendations:     This case required medical decision making of moderate complexity. The above documentation has been reviewed and is accurate and complete Judi SaaZachary M Smith, DO       Note: This dictation was prepared with Dragon dictation along with smaller phrase technology. Any transcriptional errors that result from this process are unintentional.

## 2018-09-25 ENCOUNTER — Ambulatory Visit: Payer: Self-pay

## 2018-09-25 ENCOUNTER — Encounter: Payer: Self-pay | Admitting: Family Medicine

## 2018-09-25 ENCOUNTER — Ambulatory Visit (INDEPENDENT_AMBULATORY_CARE_PROVIDER_SITE_OTHER): Payer: BLUE CROSS/BLUE SHIELD | Admitting: Family Medicine

## 2018-09-25 VITALS — BP 124/72 | HR 97 | Ht 64.0 in | Wt 123.0 lb

## 2018-09-25 DIAGNOSIS — M79671 Pain in right foot: Secondary | ICD-10-CM

## 2018-09-25 DIAGNOSIS — M1610 Unilateral primary osteoarthritis, unspecified hip: Secondary | ICD-10-CM

## 2018-09-25 DIAGNOSIS — R768 Other specified abnormal immunological findings in serum: Secondary | ICD-10-CM

## 2018-09-25 DIAGNOSIS — M999 Biomechanical lesion, unspecified: Secondary | ICD-10-CM

## 2018-09-25 DIAGNOSIS — M533 Sacrococcygeal disorders, not elsewhere classified: Secondary | ICD-10-CM

## 2018-09-25 DIAGNOSIS — M25552 Pain in left hip: Secondary | ICD-10-CM

## 2018-09-25 NOTE — Patient Instructions (Addendum)
Good to see you Xray of left hip Consider injection but lets see what orthotics do See me again in 6 weeks

## 2018-09-25 NOTE — Assessment & Plan Note (Addendum)
Decision today to treat with OMT was based on Physical Exam  After verbal consent patient was treated with HVLA, ME, FPR techniques in cervical, thoracic, lumbar and sacral and pelvic areas  Patient tolerated the procedure well with improvement in symptoms   Patient given exercises, stretches and lifestyle modifications  See medications in patient instructions if given  Patient will follow up in 4-8 weeks

## 2018-09-25 NOTE — Assessment & Plan Note (Signed)
Concern the patient may have another labral pathology of the head.  Has had weakness.  Likely some arthritic changes.  X-rays ordered today as well and may need advanced imaging

## 2018-09-25 NOTE — Assessment & Plan Note (Signed)
Patient does have a posterior laminectomy only discussed with patient again about this.  Having worsening pain in the left hip.  Has had a history of a labral tear.  Concerned that patient may have another 1.  We will consider 1 further evaluation.  Patient will get x-rays today and then consider the possibility of a MR arthrogram.  Follow-up again in 4 to 8 weeks

## 2018-09-25 NOTE — Assessment & Plan Note (Signed)
Patient is a positive titer and is still declining seeing rheumatology at this moment.  I do believe that this could be contributing to some of her aches and pains.  No change in management from my perspective then.

## 2018-09-26 ENCOUNTER — Ambulatory Visit (INDEPENDENT_AMBULATORY_CARE_PROVIDER_SITE_OTHER): Payer: BC Managed Care – PPO | Admitting: Psychiatry

## 2018-09-26 ENCOUNTER — Other Ambulatory Visit: Payer: Self-pay

## 2018-09-26 ENCOUNTER — Encounter: Payer: Self-pay | Admitting: Psychiatry

## 2018-09-26 DIAGNOSIS — F4323 Adjustment disorder with mixed anxiety and depressed mood: Secondary | ICD-10-CM | POA: Diagnosis not present

## 2018-09-26 NOTE — Progress Notes (Signed)
      [facility] Counselor/Therapist Progress Note  Patient ID: Dhwani Venkatesh, MRN: [id number],    Date: 09/26/2018  Time Spent: 49 minutres   Treatment Type: Individual Therapy  Reported Symptoms: anxious, irritable  Mental Status Exam:  Appearance:   Well Groomed     Behavior:  Appropriate  Motor:  Normal  Speech/Language:   Clear and Coherent  Affect:  Appropriate  Mood:  anxious and irritable  Thought process:  normal  Thought content:    WNL  Sensory/Perceptual disturbances:    WNL  Orientation:  oriented to person, place, time/date and situation  Attention:  Good  Concentration:  Good  Memory:  WNL  Fund of knowledge:   Good  Insight:    Good  Judgment:   Good  Impulse Control:  Good   Risk Assessment: Danger to Self:  No Self-injurious Behavior: No Danger to Others: No Duty to Warn:no Physical Aggression / Violence:No  Access to Firearms a concern: No  Gang Involvement:No   Subjective: The client states it has been a rough week for her.  She said she had a meltdown this past Friday where she got excessively angry with the person in the parking lot who was trying to back into a space.  The client was surprised at herself because she does not usually get that angry. She met with her physician who informed her that one of the tendons connected to her hip is torn and she will most likely have to have surgery.  She is already had surgery on this once before and is disappointed that she Oluwatosin Higginson have to again.  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  She has had some orthotics made that have helped her foot feel better when she walks. On the advice of her friends the client joined the dating app "hinge".  She has met a number of men and has had positive interactions with them at least by text or phone calls.  She does have one date coming up this next week.  She feels much more positive concerning her love life. We discussed the things the client needed to do to  help reduce her irritability and anxiety.  The client has anxiety mostly related to the uncertainty of the [place] pandemic.  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  She is her sole support so that always causes her some anxiety.  The client is going to continue to exercise.  She is trying to expand her social network during the time when socializing is not encouraged.  She will also increase her self-care such as reading and getting a massage. I used the bilateral stimulation hand paddles with the client.  Mostly focusing on her overall anxiety and irritability that she feels today.  Her subjective units of distress started at a 5+ and we were able to reduce it to less than 2 at the end of the session.  Interventions: Assertiveness/Communication, Motivational Interviewing, Solution-Oriented/Positive Psychology, [facility] Group Desensitization and Reprocessing (EMDR) and Insight-Oriented  Diagnosis:   ICD-10-CM   1. Adjustment disorder with mixed anxiety and depressed mood  F43.23     Plan: Assertiveness, boundaries, self-care, positive self talk, exercise.  Jina Olenick, [facility]

## 2018-10-02 ENCOUNTER — Other Ambulatory Visit: Payer: Self-pay

## 2018-10-02 ENCOUNTER — Ambulatory Visit (INDEPENDENT_AMBULATORY_CARE_PROVIDER_SITE_OTHER)
Admission: RE | Admit: 2018-10-02 | Discharge: 2018-10-02 | Disposition: A | Discharge status: Home / Self Care | Payer: Self-pay | Source: Ambulatory Visit | Attending: Family Medicine | Admitting: Family Medicine

## 2018-10-02 DIAGNOSIS — M25552 Pain in left hip: Secondary | ICD-10-CM

## 2018-10-07 ENCOUNTER — Encounter: Payer: Self-pay | Admitting: Psychiatry

## 2018-10-07 ENCOUNTER — Other Ambulatory Visit: Payer: Self-pay

## 2018-10-07 ENCOUNTER — Ambulatory Visit (INDEPENDENT_AMBULATORY_CARE_PROVIDER_SITE_OTHER): Payer: BC Managed Care – PPO | Admitting: Psychiatry

## 2018-10-07 DIAGNOSIS — F4323 Adjustment disorder with mixed anxiety and depressed mood: Secondary | ICD-10-CM | POA: Diagnosis not present

## 2018-10-07 NOTE — Progress Notes (Signed)
      [facility] Counselor/Therapist Progress Note  Patient ID: Terri [name], MRN: [id number],    Date: 10/07/2018  Time Spent: 50 minutes  Treatment Type: Individual Therapy  Reported Symptoms: Anxiety, sadness  Mental Status Exam:  Appearance:   Well Groomed     Behavior:  Appropriate  Motor:  Normal  Speech/Language:   Clear and Coherent  Affect:  Appropriate  Mood:  anxious and sad  Thought process:  normal  Thought content:    WNL  Sensory/Perceptual disturbances:    WNL  Orientation:  oriented to person, place, time/date and situation  Attention:  Good  Concentration:  Good  Memory:  WNL  Fund of knowledge:   Good  Insight:    Good  Judgment:   Good  Impulse Control:  Good   Risk Assessment: Danger to Self:  No Self-injurious Behavior: No Danger to Others: No Duty to Warn:no Physical Aggression / Violence:No  Access to Firearms a concern: No  Gang Involvement:No   Subjective: The client has recently started dating which she states has gone well.  This has been a positive development in her life.  She describes her friends life as, "blowing up."  Her biggest concern has been her mother. The client's mother is 29 years old.  The client is concerned that when her mother dies she will not be able to handle it.  She became quite tearful during the course of the session as she discussed it.  I used the bilateral stimulation hand paddles with the client to help her decrease her subjective units of distress from a 7+ to less than 3 at the end of the session.  I posited to the client that she might be pre-grieving her mother's death.  She does not feel like she has any unresolved issues with her mom.  Although she is very sad that her oldest sister has no communication with her mom at all.  That sister says if their mom died she would be okay.  As the client described more of her older sisters dynamics with the mom it is clear that that will not be the case.  We discussed how  the client would need to be there for her older sister which seemed to give her some solace.  The client will look into reading about grief.  Interventions: Assertiveness/Communication, Motivational Interviewing, Solution-Oriented/Positive Psychology, [employer] Group Desensitization and Reprocessing (EMDR) and Insight-Oriented  Diagnosis:   ICD-10-CM   1. Adjustment disorder with mixed anxiety and depressed mood  F43.23     Plan: Read about grief, self-care, exercise, consult with her orthopedist about her hip.  Severin Bou, [facility]

## 2018-10-10 ENCOUNTER — Telehealth: Payer: Self-pay | Admitting: Internal Medicine

## 2018-10-10 NOTE — Telephone Encounter (Signed)
Requested medication (s) are due for refill today: yes  Requested medication (s) are on the active medication list: yes  Last refill:  09/10/2018  Future visit scheduled: yes  Notes to clinic: refill cannot be delegated   Requested Prescriptions  Pending Prescriptions Disp Refills   methylphenidate (CONCERTA) 36 MG PO CR tablet 30 tablet 0    Sig: Take 1 tablet (36 mg total) by mouth daily.     Not Delegated - Psychiatry:  Stimulants/ADHD Failed - 10/10/2018 12:22 PM      Failed - This refill cannot be delegated      Failed - Urine Drug Screen completed in last 360 days.      Passed - Valid encounter within last 3 months    Recent Outpatient Visits          2 weeks ago Right foot pain   [place], [place], DO   4 months ago Nonallopathic lesion of sacral region   [place], [place], DO   6 months ago Nonallopathic lesion of rib cage   [place], [place], DO   7 months ago Attention deficit hyperactivity disorder (ADHD), unspecified ADHD type   Therapist, music at [employer], Standley Brooking, MD   8 months ago Nonallopathic lesion of rib cage   [place], [place], DO      Future Appointments            In 1 week Lyndal Pulley, [place], [place]   In 3 weeks Lyndal Pulley, [place], [facility]

## 2018-10-10 NOTE — Telephone Encounter (Signed)
Medication Refill - Medication: methylphenidate (CONCERTA) 36 MG PO CR tablet   Has the patient contacted their pharmacy? Yes.   (Agent: If no, request that the patient contact the pharmacy for the refill.) (Agent: If yes, when and what did the pharmacy advise?)  Preferred Pharmacy (with phone number or street name):  [id number] - [place], [place]. [address] [place] [address]  Phone: [phone] Fax: [phone]   Please do not send to [facility] [place]. Please use listed pharmacy  Agent: Please be advised that RX refills may take up to 3 business days. We ask that you follow-up with your pharmacy.

## 2018-10-14 ENCOUNTER — Telehealth: Payer: Self-pay

## 2018-10-14 ENCOUNTER — Other Ambulatory Visit: Payer: Self-pay

## 2018-10-14 DIAGNOSIS — Z20822 Contact with and (suspected) exposure to covid-19: Secondary | ICD-10-CM

## 2018-10-14 DIAGNOSIS — R6889 Other general symptoms and signs: Secondary | ICD-10-CM | POA: Diagnosis not present

## 2018-10-14 NOTE — Telephone Encounter (Signed)
Spoke with patient who inquired about pelvic MRI as she wants image to show ischial tuberosity area. Per a verbal from Dr. Tamala Julian the hip order should encompass that area. Patient notified.

## 2018-10-15 LAB — NOVEL CORONAVIRUS, NAA: SARS-CoV-2, NAA: NOT DETECTED

## 2018-10-15 MED ORDER — METHYLPHENIDATE HCL ER (OSM) 36 MG PO TBCR: 36.0000 mg | EXTENDED_RELEASE_TABLET | Freq: Every day | ORAL | 0 refills | Status: DC

## 2018-10-15 NOTE — Telephone Encounter (Signed)
Sent in electronically . Tell patient  I never received this request  Until the day    Apologies for our system delay .

## 2018-10-15 NOTE — Telephone Encounter (Signed)
Pt calling for status of her medication and wantt to know why was it denied. Please call pt b/c she is on her last pill.

## 2018-10-15 NOTE — Telephone Encounter (Signed)
Do not see where we denied medication Saw pt was due for follow up pt has been scheduled for Friday but would like for Medication to be sent in since today was her last one and she requested medication last week. Please advise

## 2018-10-15 NOTE — Telephone Encounter (Signed)
Pt has been notified that it has been sent in  

## 2018-10-15 NOTE — Addendum Note (Signed)
Addended byShanon Ace K on: 10/15/2018 03:19 PM   Modules accepted: Orders

## 2018-10-16 NOTE — Progress Notes (Signed)
Virtual Visit via Video Note  I connected with@ on 10/18/18 at  3:00 PM EDT by a video enabled telemedicine application and verified that I am speaking with the correct person using two identifiers. Location patient: work [employer] treasure[employer] provider: office Persons participating in the virtual visit: patient, provider  [place]th national recommendations  regarding COVID 19 pandemic   video visit is advised over in office visit for this patient.  Patient aware  of the limitations of evaluation and management by telemedicine and  availability of in person appointments. and agreed to proceed.   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  Taking low loestrin and sprionolacton 25 mg  For acne   Per derm . Not sure if had blood test in psat year .  To have some son with gyne.  Taking Wellbutrin qd with help   At this time    No sig se  Hip and various  Ms issues to get hip mri soon.  Taking gaba only prn nerve pain  HAs not much  Sinus flonase and saline helping keep sinus at bay  ROS: See pertinent positives and negatives per HPI.  Past Medical History:  Diagnosis Date  . Anxiety   . Depression   . Environmental allergies   . GERD (gastroesophageal reflux disease)   . Headache(784.0)   . Recurrent sinusitis    ent and pulm eval in past.    Past Surgical History:  Procedure Laterality Date  . HIP ARTHROSCOPY W/ LABRAL DEBRIDEMENT    . left hip surgery     [facility]    Family History  Problem Relation Age of Onset  . Heart disease Unknown        fhx    Social History   Tobacco Use  . Smoking status: Never Smoker  . Smokeless tobacco: Never Used  Substance Use Topics  . Alcohol use: Yes    Comment: Occasional use  . Drug use: No      Current Outpatient Medications:  .  ALPRAZolam (XANAX) 0.25 MG tablet, Take 1  tablet (0.25 mg total) by mouth 3 (three) times daily as needed for anxiety (panic)., Disp: 20 tablet, Rfl: 0 .  buPROPion (WELLBUTRIN XL) 150 MG 24 hr tablet, TAKE 2 TABLETS (300 MG TOTAL) BY MOUTH DAILY., Disp: 60 tablet, Rfl: 1 .  buPROPion (WELLBUTRIN XL) 300 MG 24 hr tablet, TAKE 1 TABLET BY MOUTH EVERY DAY, Disp: 30 tablet, Rfl: 6 .  fish oil-omega-3 fatty acids 1000 MG capsule, Take 1 g by mouth daily.  , Disp: , Rfl:  .  fluconazole (DIFLUCAN) 150 MG tablet, 1 pill today then repeat in 4 days, Disp: 2 tablet, Rfl: 0 .  fluticasone (FLONASE) 50 MCG/ACT nasal spray, SPRAY 2 SPRAYS INTO EACH NOSTRIL EVERY DAY, Disp: 16 g, Rfl: 11 .  gabapentin (NEURONTIN) 100 MG capsule, TAKE 2 CAPSULES BY MOUTH AT BEDTIME, Disp: 60 capsule, Rfl: 3 .  LYSINE PO, Take 1,000 mg by mouth daily. for fever blisters., Disp: , Rfl:  .  methylphenidate (CONCERTA) 36 MG PO CR tablet, Take 1 tablet (36 mg total) by mouth daily., Disp: 30 tablet, Rfl: 0 .  Norethin-Eth Estrad-Fe Biphas (LO LOESTRIN FE PO), Take by mouth., Disp: , Rfl:  .  Nutritional Supplements (DHEA PO), Take by mouth.,  Disp: , Rfl:  .  spironolactone (ALDACTONE) 25 MG tablet, Take 25 mg by mouth daily., Disp: , Rfl:  .  SUMAtriptan (IMITREX) 100 MG tablet, Take 1 at onset of  Migraine headache   and can repeat in 2 hours ., Disp: 9 tablet, Rfl: 1 .  valACYclovir (VALTREX) 1000 MG tablet, TAKE 1 TABLET (1,000 MG TOTAL) BY MOUTH 2 (TWO) TIMES DAILY., Disp: 30 tablet, Rfl: 1 .  Vitamin D, Ergocalciferol, (DRISDOL) 50000 units CAPS capsule, TAKE 1 CAPSULE EVERY 7 DAYS, Disp: 12 capsule, Rfl: 0  EXAM: BP Readings from Last 3 Encounters:  09/25/18 124/72  05/15/18 122/74  04/03/18 122/80    VITALS per patient if applicable:  GENERAL: alert, oriented, appears well and in no acute distress HEENT: atraumatic, conjunttiva clear, no obvious abnormalities on inspection of external nose and ears NECK: normal movements of the head and neck LUNGS: on  inspection no signs of respiratory distress, breathing rate appears normal, no obvious gross SOB, gasping or wheezing CV: no obvious cyanosis MS: moves all visible extremities without noticeable abnormality PSYCH/NEURO: pleasant and cooperative, no obvious depression or anxiety, speech and thought processing grossly intact Lab Results  Component Value Date   GLUCOSE 103 (H) 07/12/2015   ALT 20 07/12/2015   AST 20 07/12/2015   NA 137 07/12/2015   K 3.7 07/12/2015   CL 105 07/12/2015   CREATININE 0.99 07/12/2015   BUN 14 07/12/2015   CO2 22 07/12/2015    ASSESSMENT AND PLAN:  Discussed the following assessment and plan:    ICD-10-CM   1. Attention deficit hyperactivity disorder (ADHD), unspecified ADHD type  F90.9   2. Medication management  Z79.899   3. Chronic sinus complaints  R09.89    controlled flonase and saline   Benefit more than risk of medications  to continue.  Counseled.   Expectant management and discussion of plan and treatment with opportunity to ask questions and all were answered. The patient agreed with the plan and demonstrated an understanding of the instructions.  plan cpx or in person visit in about 6 mos  Get appt for flu vaccine also .  Same med  At this time Can make sure getting chem panel yearly since on spironolactone  Gals her sinus hygiene with flonase and saline is working  Advised to call back or seek an in-person evaluation if worsening  or having  further concerns .     Shanon Ace, MD

## 2018-10-18 ENCOUNTER — Encounter: Payer: Self-pay | Admitting: Internal Medicine

## 2018-10-18 ENCOUNTER — Telehealth (INDEPENDENT_AMBULATORY_CARE_PROVIDER_SITE_OTHER): Payer: Self-pay | Admitting: Internal Medicine

## 2018-10-18 ENCOUNTER — Other Ambulatory Visit: Payer: Self-pay

## 2018-10-18 DIAGNOSIS — R0989 Other specified symptoms and signs involving the circulatory and respiratory systems: Secondary | ICD-10-CM

## 2018-10-18 DIAGNOSIS — F909 Attention-deficit hyperactivity disorder, unspecified type: Secondary | ICD-10-CM

## 2018-10-18 DIAGNOSIS — Z79899 Other long term (current) drug therapy: Secondary | ICD-10-CM

## 2018-10-18 MED ORDER — FLUTICASONE PROPIONATE 50 MCG/ACT NA SUSP: NASAL | 11 refills | Status: DC

## 2018-10-22 ENCOUNTER — Encounter: Payer: Self-pay | Admitting: Family Medicine

## 2018-10-22 ENCOUNTER — Other Ambulatory Visit: Payer: Self-pay

## 2018-10-22 ENCOUNTER — Ambulatory Visit (INDEPENDENT_AMBULATORY_CARE_PROVIDER_SITE_OTHER): Payer: Self-pay | Admitting: Family Medicine

## 2018-10-22 DIAGNOSIS — R269 Unspecified abnormalities of gait and mobility: Secondary | ICD-10-CM

## 2018-10-22 MED ORDER — DIAZEPAM 5 MG PO TABS: ORAL_TABLET | ORAL | 0 refills | Status: DC

## 2018-10-22 NOTE — Progress Notes (Signed)
Procedure Note   Patient was fitted for a : standard, cushioned, semi-rigid orthotic. The orthotic was heated and afterward the patient patient seated position and molded The patient was positioned in subtalar neutral position and 10 degrees of ankle dorsiflexion in a weight bearing stance. After completion of molding, patient did have orthotic management The blank was ground to a stable position for weight bearing. Size: 7 Base: Carbon fiber Additional Posting and Padding: Medial 270/90 250/70 Transverse 200/50 left and right  The patient ambulated these, and they were very comfortable.

## 2018-10-22 NOTE — Assessment & Plan Note (Signed)
Orthotics made today.  Tolerated the procedure well.  We discussed investments.  Necessary over the next 2 weeks.  Discussed with patient about icing regimen and home exercises.  Discussed intake exam.  Patient will follow-up again in 4 weeks if necessary.

## 2018-10-23 ENCOUNTER — Ambulatory Visit (INDEPENDENT_AMBULATORY_CARE_PROVIDER_SITE_OTHER): Payer: BC Managed Care – PPO | Admitting: Psychiatry

## 2018-10-23 ENCOUNTER — Other Ambulatory Visit: Payer: Self-pay

## 2018-10-23 ENCOUNTER — Ambulatory Visit
Admission: RE | Admit: 2018-10-23 | Discharge: 2018-10-23 | Disposition: A | Discharge status: Home / Self Care | Payer: Self-pay | Source: Ambulatory Visit | Attending: Family Medicine | Admitting: Family Medicine

## 2018-10-23 ENCOUNTER — Encounter: Payer: Self-pay | Admitting: Psychiatry

## 2018-10-23 DIAGNOSIS — M25552 Pain in left hip: Secondary | ICD-10-CM | POA: Diagnosis not present

## 2018-10-23 DIAGNOSIS — F4322 Adjustment disorder with anxiety: Secondary | ICD-10-CM | POA: Diagnosis not present

## 2018-10-23 MED ORDER — IOPAMIDOL (ISOVUE-M 200) INJECTION 41%
11.0000 mL | Freq: Once | INTRAMUSCULAR | Status: AC
  Administered 2018-10-23: 11 mL via INTRA_ARTICULAR

## 2018-10-23 NOTE — Progress Notes (Signed)
      [facility] Counselor/Therapist Progress Note  Patient ID: Terri [name], MRN: [id number],    Date: 10/23/2018  Time Spent: 56 minutes   Treatment Type: Individual Therapy  Reported Symptoms: anxious  Mental Status Exam:  Appearance:   Well Groomed     Behavior:  Appropriate  Motor:  Normal  Speech/Language:   Clear and Coherent  Affect:  Appropriate  Mood:  anxious  Thought process:  normal  Thought content:    WNL  Sensory/Perceptual disturbances:    WNL  Orientation:  oriented to person, place, time/date and situation  Attention:  Good  Concentration:  Good  Memory:  WNL  Fund of knowledge:   Good  Insight:    Good  Judgment:   Good  Impulse Control:  Good   Risk Assessment: Danger to Self:  No Self-injurious Behavior: No Danger to Others: No Duty to Warn:no Physical Aggression / Violence:No  Access to Firearms a concern: No  Gang Involvement:No   Subjective: The client states she has to have an MRI on her hip today with contrast.  They will have to inject the contrast into her joint.  The client is very phobic about needles and today is exceptionally anxious.  Her subjective units of distress is a 10+.  I used eye-movement with the client focusing on the shot she is going to receive today.  As we processed the client was able to see her level of anxiety reduce to at least a 5.  I pointed out to the client that her physician had given her two 5 mg tablets of Valium to take before the procedure.  The client agreed that this would help although she has never taken Valium before.  As we continue to process, memories of her childhood came up when she was in the doctor's office getting a vaccination.  She remembered getting so afraid she ran out of the office and down the road to their house. The client has someone driving her to the appointment.  She agrees she should be able to manage this.  She continued using the bilateral stimulation hand paddles through the course  of the session.  Her subjective units of distress at the end of the session was less than 5.  She knows that she does not like to have a shot but she will use radical acceptance to manage it.  Interventions: Motivational Interviewing, Solution-Oriented/Positive Psychology, [employer] Group Desensitization and Reprocessing (EMDR) and Insight-Oriented  Diagnosis:   ICD-10-CM   1. Adjustment disorder with anxiety  F43.22     Plan: Positive self talk, boundaries, radical acceptance.  Elvira Langston, [facility]

## 2018-10-27 ENCOUNTER — Other Ambulatory Visit: Payer: Self-pay | Admitting: Internal Medicine

## 2018-10-27 ENCOUNTER — Other Ambulatory Visit: Payer: Self-pay | Admitting: Family Medicine

## 2018-10-29 ENCOUNTER — Other Ambulatory Visit: Payer: Self-pay

## 2018-10-29 ENCOUNTER — Ambulatory Visit (INDEPENDENT_AMBULATORY_CARE_PROVIDER_SITE_OTHER): Payer: BC Managed Care – PPO | Admitting: Psychiatry

## 2018-10-29 ENCOUNTER — Encounter: Payer: Self-pay | Admitting: Psychiatry

## 2018-10-29 DIAGNOSIS — F4322 Adjustment disorder with anxiety: Secondary | ICD-10-CM

## 2018-10-29 NOTE — Progress Notes (Signed)
      [facility] Counselor/Therapist Progress Note  Patient ID: Terri [name], MRN: [id number],    Date: 10/29/2018  Time Spent: 59 minutes   Treatment Type: Individual Therapy  Reported Symptoms: anxiety  Mental Status Exam:  Appearance:   Well Groomed     Behavior:  Appropriate  Motor:  Normal  Speech/Language:   Clear and Coherent  Affect:  Appropriate  Mood:  anxious  Thought process:  normal  Thought content:    WNL  Sensory/Perceptual disturbances:    WNL  Orientation:  oriented to person, place, time/date and situation  Attention:  Good  Concentration:  Good  Memory:  WNL  Fund of knowledge:   Good  Insight:    Good  Judgment:   Good  Impulse Control:  Good   Risk Assessment: Danger to Self:  No Self-injurious Behavior: No Danger to Others: No Duty to Warn:no Physical Aggression / Violence:No  Access to Firearms a concern: No  Gang Involvement:No   Subjective: The client states that she was able to successfully get her arthrogram in her hip.  Her MRI really showed no tear or damage to the socket.  They are still unsure as to the source of her pain.  She thinks there Terri [name] be an impingement in her hip contributing to the pain.  This continues to cause her anxiety since she is really unable to exercise the way she is used to.  She has gained weight which distresses her as well. The client states that socially she has done okay.  She has briefly dated 2 different men.  Nothing seems to have worked out though.  She continues to pursue relationship with her girlfriends.  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  Since working for herself she has been able to navigate the [place] pandemic more effectively than if she had been at another shop. The client discussed that she has been feeling blue.  I used the bilateral stimulation hand paddles with the client.  On October 4 is the anniversary of her father's death which occurred in 1999-05-22.  She states this always  weighs her down.  She worries about her mother who is 52.  She feels if her mom dies she will not be able to handle it.  As we continue to discuss it the client became more circumspect.  She knows that it will be difficult but she has done everything she can for her mom.  She is a little distressed that her mom has no relationship with the client's older sister.  She knows she is powerless to change this.  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  Her subjective units of distress went from a 5+ to less than 3 at the end of the session.  Interventions: Assertiveness/Communication, Motivational Interviewing, Solution-Oriented/Positive Psychology, [employer] Group Desensitization and Reprocessing (EMDR) and Insight-Oriented  Diagnosis:   ICD-10-CM   1. Adjustment disorder with anxiety  F43.22     Plan: Positive self talk, radical acceptance, boundaries, assertiveness, mild exercise.  Terri [name], [facility]

## 2018-11-04 NOTE — Assessment & Plan Note (Signed)
Benign hypermobility syndrome.  Discussed posture and ergonomics, discussed which activities to do which wants to avoid.  Patient is to increase activity slowly.  Patient will continue to increase activity as tolerated.  Follow-up with me again in 4 to 8 weeks

## 2018-11-04 NOTE — Assessment & Plan Note (Signed)
Decision today to treat with OMT was based on Physical Exam  After verbal consent patient was treated with HVLA, ME, FPR techniques in  thoracic, lumbar and sacral rib and pelvis areas  Patient tolerated the procedure well with improvement in symptoms  Patient given exercises, stretches and lifestyle modifications  See medications in patient instructions if given  Patient will follow up in 4-8 weeks

## 2018-11-04 NOTE — Progress Notes (Signed)
Tawana ScaleZach Dee Paden D.O. [name] [address] [place], [place] [address] Phone: [phone] Subjective:   I Terri NighKana [name] am serving as a Neurosurgeonscribe for Dr. Antoine PrimasZachary Kaymen Adrian.  I'm seeing this patient by the request  of:    CC: Left hip pain follow-up  OZH:[id number]:Subjective   10/22/2018 Orthotics made today.  Tolerated the procedure well.  We discussed investments.  Necessary over the next 2 weeks.  Discussed with patient about icing regimen and home exercises.  Discussed intake exam.  Patient will follow-up again in 4 weeks if necessary.  11/06/2018 Charlotta NewtonJulie Vigen is a 53 y.o. female coming in with complaint of abnormality of gait. States she does not like the orthotics. Pain in her feet. Has tried readjustments and still can't find any comfort. Does not wear them at all.     Patient did have an MRI of the left hip done October 23, 2018.  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  Past Medical History:  Diagnosis Date  . Anxiety   . Depression   . Environmental allergies   . GERD (gastroesophageal reflux disease)   . Headache(784.0)   . Recurrent sinusitis    ent and pulm eval in past.   Past Surgical History:  Procedure Laterality Date  . HIP ARTHROSCOPY W/ LABRAL DEBRIDEMENT    . left hip surgery     [facility]   Social History   Socioeconomic History  . Marital status: Single    Spouse name: Not on file  . Number of children: Not on file  . Years of education: Not on file  . Highest education level: Not on file  Occupational History  . Not on file  Social Needs  . Financial resource strain: Not on file  . Food insecurity    Worry: Not on file    Inability: Not on file  . Transportation needs    Medical: Not on file    Non-medical: Not on file  Tobacco Use  . Smoking status: Never Smoker  . Smokeless tobacco: Never Used  Substance and Sexual Activity  . Alcohol use: Yes    Comment:  Occasional use  . Drug use: No  . Sexual activity: Yes    Birth control/protection: Pill  Lifestyle  . Physical activity    Days per week: Not on file    Minutes per session: Not on file  . Stress: Not on file  Relationships  . Social Musicianconnections    Talks on phone: Not on file    Gets together: Not on file    Attends religious service: Not on file    Active member of club or organization: Not on file    Attends meetings of clubs or organizations: Not on file    Relationship status: Not on file  Other Topics Concern  . 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 Augmentin [Amoxicillin-Pot Clavulanate] Nausea Only  . Celecoxib     REACTION: unspecified  . Sulfonamide Derivatives     REACTION: hives, throat swelling  . Monistat [Miconazole] Other (See Comments)    Sensitive to topical vaginal  Azoles No side effects with oral Diflucan   Family History  Problem Relation Age of Onset  . Heart disease Unknown        fhx    Current Outpatient  Medications (Endocrine & Metabolic):  [name]  Norethin-Eth Estrad-Fe Biphas (LO LOESTRIN FE PO), Take by mouth.  Current Outpatient Medications (Cardiovascular):  .  spironolactone (ALDACTONE) 25 MG tablet, Take 25 mg by mouth daily.  Current Outpatient Medications (Respiratory):  .  fluticasone (FLONASE) 50 MCG/ACT nasal spray, SPRAY 2 SPRAYS INTO EACH NOSTRIL EVERY DAY  Current Outpatient Medications (Analgesics):  [name]  SUMAtriptan (IMITREX) 100 MG tablet, Take 1 at onset of  Migraine headache   and can repeat in 2 hours .   Current Outpatient Medications (Other):  [name]  ALPRAZolam (XANAX) 0.25 MG tablet, Take 1 tablet (0.25 mg total) by mouth 3 (three) times daily as needed for anxiety (panic). [name]  buPROPion (WELLBUTRIN XL) 150 MG 24 hr tablet, TAKE 2 TABLETS (300 MG TOTAL) BY MOUTH DAILY. [name]  buPROPion (WELLBUTRIN XL) 300 MG 24 hr tablet, TAKE 1 TABLET BY MOUTH EVERY DAY  .  diazepam (VALIUM) 5 MG tablet, One tab by mouth, 2 hours before procedure. .  fish oil-omega-3 fatty acids 1000 MG capsule, Take 1 g by mouth daily.   .  fluconazole (DIFLUCAN) 150 MG tablet, 1 pill today then repeat in 4 days .  gabapentin (NEURONTIN) 100 MG capsule, TAKE 2 CAPSULES BY MOUTH EVERY DAY AT BEDTIME .  LYSINE PO, Take 1,000 mg by mouth daily. for fever blisters. .  methylphenidate (CONCERTA) 36 MG PO CR tablet, Take 1 tablet (36 mg total) by mouth daily. .  Nutritional Supplements (DHEA PO)*, Take by mouth. .  valACYclovir (VALTREX) 1000 MG tablet, TAKE 1 TABLET (1,000 MG TOTAL) BY MOUTH 2 (TWO) TIMES DAILY. [name]  Vitamin D, Ergocalciferol, (DRISDOL) 50000 units CAPS capsule, TAKE 1 CAPSULE EVERY 7 DAYS * These medications belong to multiple therapeutic classes and are listed under each applicable group.    Past medical history, social, surgical and family history all reviewed in electronic medical record.  No pertanent information unless stated regarding to the chief complaint.   Review of Systems:  No headache, visual changes, nausea, vomiting, diarrhea, constipation, dizziness, abdominal pain, skin rash, fevers, chills, night sweats, weight loss, swollen lymph nodes, body aches, joint swelling, muscle aches, chest pain, shortness of breath, mood changes.   Objective  Blood pressure 114/80, pulse (!) 103, height 5\' 4"  (1.626 m), weight 125 lb (56.7 kg), last menstrual period 07/19/2017, SpO2 98 %.   General: No apparent distress alert and oriented x3 mood and affect normal, dressed appropriately.  HEENT: Pupils equal, extraocular movements intact  Respiratory: Patient's speak in full sentences and does not appear short of breath  Cardiovascular: No lower extremity edema, non tender, no erythema  Skin: Warm dry intact with no signs of infection or rash on extremities or on axial skeleton.  Abdomen: Soft nontender  Neuro: Cranial nerves II through XII are intact,  neurovascularly intact in all extremities with 2+ DTRs and 2+ pulses.  Lymph: No lymphadenopathy of posterior or anterior cervical chain or axillae bilaterally.  Gait normal with good balance and coordination.  MSK:  tender with limited range of motion and good stability and symmetric strength and tone of shoulders, elbows, wrist, hip, knee and ankles bilaterally.  Back Exam:  Inspection: Unremarkable  Motion: Flexion 45 deg, Extension 25 deg, Side Bending to 35 deg bilaterally,  Rotation to 45 deg bilaterally  SLR laying: Negative  XSLR laying: Negative  Palpable tenderness: Tender to palpation.07/21/2017 FABER: Tightness bilaterally. Sensory change: Gross sensation intact to all lumbar and sacral dermatomes.  Reflexes: 2+ at  both patellar tendons, 2+ at achilles tendons, Babinski's downgoing.  Strength at foot  Plantar-flexion: 5/5 Dorsi-flexion: 5/5 Eversion: 5/5 Inversion: 5/5  Leg strength  Quad: 5/5 Hamstring: 5/5 Hip flexor: 5/5 Hip abductors: 5/5  Gait unremarkable.   Osteopathic findings  T3 extended rotated and side bent right inhaled third rib T9 extended rotated and side bent left L2 flexed rotated and side bent right Sacrum right on right Pelvic shear left   Impression and Recommendations:     This case required medical decision making of moderate complexity. The above documentation has been reviewed and is accurate and complete Lyndal Pulley, DO       Note: This dictation was prepared with Dragon dictation along with smaller phrase technology. Any transcriptional errors that result from this process are unintentional.

## 2018-11-06 ENCOUNTER — Other Ambulatory Visit: Payer: Self-pay

## 2018-11-06 ENCOUNTER — Ambulatory Visit (INDEPENDENT_AMBULATORY_CARE_PROVIDER_SITE_OTHER): Payer: BC Managed Care – PPO | Admitting: Family Medicine

## 2018-11-06 ENCOUNTER — Encounter: Payer: Self-pay | Admitting: Family Medicine

## 2018-11-06 VITALS — BP 114/80 | HR 103 | Ht 64.0 in | Wt 125.0 lb

## 2018-11-06 DIAGNOSIS — M357 Hypermobility syndrome: Secondary | ICD-10-CM

## 2018-11-06 DIAGNOSIS — M999 Biomechanical lesion, unspecified: Secondary | ICD-10-CM | POA: Diagnosis not present

## 2018-11-06 DIAGNOSIS — M1610 Unilateral primary osteoarthritis, unspecified hip: Secondary | ICD-10-CM | POA: Diagnosis not present

## 2018-11-06 NOTE — Patient Instructions (Signed)
Try orthotics again Vitamin C with all your iron containing meals

## 2018-11-06 NOTE — Assessment & Plan Note (Signed)
Has had difficulty for some time.  Labral pathology is noted of the left hip.  Discussed the possibility though of also with the small tearing of the hamstring tendon about the possibility of injection.  Patient will consider this.  We will continue with the home exercises and the icing regimen, discussed topical anti-inflammatories.  Follow-up again in 4 to 8 weeks

## 2018-11-11 ENCOUNTER — Ambulatory Visit: Payer: BC Managed Care – PPO | Admitting: Psychiatry

## 2018-11-21 DIAGNOSIS — M9902 Segmental and somatic dysfunction of thoracic region: Secondary | ICD-10-CM | POA: Diagnosis not present

## 2018-11-21 DIAGNOSIS — M5386 Other specified dorsopathies, lumbar region: Secondary | ICD-10-CM | POA: Diagnosis not present

## 2018-11-21 DIAGNOSIS — M9903 Segmental and somatic dysfunction of lumbar region: Secondary | ICD-10-CM | POA: Diagnosis not present

## 2018-11-21 DIAGNOSIS — M9908 Segmental and somatic dysfunction of rib cage: Secondary | ICD-10-CM | POA: Diagnosis not present

## 2018-11-22 ENCOUNTER — Telehealth: Payer: Self-pay | Admitting: Internal Medicine

## 2018-11-22 MED ORDER — METHYLPHENIDATE HCL ER (OSM) 36 MG PO TBCR: 36.0000 mg | EXTENDED_RELEASE_TABLET | Freq: Every day | ORAL | 0 refills | Status: DC

## 2018-11-22 NOTE — Telephone Encounter (Signed)
Medication Refill - Medication: methylphenidate (CONCERTA) 36 MG PO CR tablet   Has the patient contacted their pharmacy? Yes.   (Agent: If no, request that the patient contact the pharmacy for the refill.) (Agent: If yes, when and what did the pharmacy advise?)  Preferred Pharmacy (with phone number or street name):  [id number] [place], [place]. AT [address] [phone] (Phone) [phone] (Fax)     Agent: Please be advised that RX refills may take up to 3 business days. We ask that you follow-up with your pharmacy.

## 2018-11-22 NOTE — Telephone Encounter (Signed)
Requested medication (s) are due for refill today: yes  Requested medication (s) are on the active medication list: yes  Last refill:  10/15/2018  Future visit scheduled: yes  Notes to clinic:  Refill cannot be delegated    Requested Prescriptions  Pending Prescriptions Disp Refills   methylphenidate (CONCERTA) 36 MG PO CR tablet 30 tablet 0    Sig: Take 1 tablet (36 mg total) by mouth daily.     Not Delegated - Psychiatry:  Stimulants/ADHD Failed - 11/22/2018  9:37 AM      Failed - This refill cannot be delegated      Failed - Urine Drug Screen completed in last 360 days.      Passed - Valid encounter within last 3 months    Recent Outpatient Visits          2 weeks ago Nonallopathic lesion of pelvic region   [place], [place], DO   1 month ago Abnormality of gait   [place], [place], DO   1 month ago Attention deficit hyperactivity disorder (ADHD), unspecified ADHD type   Therapist, music at [employer], Standley Brooking, MD   1 month ago Right foot pain   [place], [place], DO   6 months ago Nonallopathic lesion of sacral region   [place], [place], DO      Future Appointments            In 2 weeks Lyndal Pulley, DO [place], [place]   In 5 months Panosh, Standley Brooking, MD [employer] at [place], [facility]

## 2018-11-22 NOTE — Telephone Encounter (Signed)
Last ov:10/18/2018 Last filled:10/15/2018

## 2018-11-25 ENCOUNTER — Encounter: Payer: Self-pay | Admitting: Psychiatry

## 2018-11-25 ENCOUNTER — Other Ambulatory Visit: Payer: Self-pay

## 2018-11-25 ENCOUNTER — Ambulatory Visit (INDEPENDENT_AMBULATORY_CARE_PROVIDER_SITE_OTHER): Payer: BC Managed Care – PPO | Admitting: Psychiatry

## 2018-11-25 DIAGNOSIS — F4322 Adjustment disorder with anxiety: Secondary | ICD-10-CM | POA: Diagnosis not present

## 2018-11-25 NOTE — Progress Notes (Signed)
[facility] Counselor/Therapist Progress Note  Patient ID: Terri [name], MRN: [id number],    Date: 11/25/2018  Time Spent: 56 minutes   Treatment Type: Individual Therapy  Reported Symptoms: anxiety  Mental Status Exam:  Appearance:   Casual     Behavior:  Appropriate  Motor:  Normal  Speech/Language:   Clear and Coherent  Affect:  Appropriate  Mood:  anxious  Thought process:  normal  Thought content:    WNL  Sensory/Perceptual disturbances:    WNL  Orientation:  oriented to person, place, time/date and situation  Attention:  Good  Concentration:  Good  Memory:  WNL  Fund of knowledge:   Good  Insight:    Good  Judgment:   Good  Impulse Control:  Good   Risk Assessment: Danger to Self:  No Self-injurious Behavior: No Danger to Others: No Duty to Warn:no Physical Aggression / Violence:No  Access to Firearms a concern: No  Gang Involvement:No   Subjective: The client is stressed out with the upcoming election.  She finds that in her [place] she has to be very careful about what she says.  She has found that she has inadvertently lost clients because they believed she held the wrong political beliefs.  The client is concerned about the polarization in the country and of the vilification of people for the beliefs that they hold. I used the bilateral stimulation hand paddles with the client to process her anxiety around this.  Her subjective units of distress started at a 6 and ended at less and 2.  She was able to let go of the clients that she lost.  She notes that her standard role is to be very neutral.  She will be more diligent with that.  Her positive cognition was, "I can be neutral." The client has also been dating a man she has met on [email or web address].  He lives in [place], [place].  They have connected well.  She is surprised that he is as supportive and kind as he is.  He is well employed with his own Psychologist, counselling.  He has offices  across the world.  She states they have a lot in common.  She is thankful that he pursues her and that she does not have to initiate everything.  This has been a very positive development for the client and has increased her confidence.  It also helps to reduce her overall anxiety.  We discussed continuing to approach this relationship in a pragmatic way.  It is really hard to get a good sense of her relationship until after the romantic period is over which is about 18 months.  The client agrees and will continue to be cautiously optimistic.  Interventions: Assertiveness/Communication, Motivational Interviewing, Solution-Oriented/Positive Psychology, [employer] Group Desensitization and Reprocessing (EMDR) and Insight-Oriented  Diagnosis:   ICD-10-CM   1. Adjustment disorder with anxiety  F43.22     Plan: Positive self talk, neutral stance, assertiveness, boundaries, self-care, exercise.  Terri [name], [facility]

## 2018-11-26 DIAGNOSIS — M9908 Segmental and somatic dysfunction of rib cage: Secondary | ICD-10-CM | POA: Diagnosis not present

## 2018-11-26 DIAGNOSIS — M9903 Segmental and somatic dysfunction of lumbar region: Secondary | ICD-10-CM | POA: Diagnosis not present

## 2018-11-26 DIAGNOSIS — M9902 Segmental and somatic dysfunction of thoracic region: Secondary | ICD-10-CM | POA: Diagnosis not present

## 2018-11-26 DIAGNOSIS — M5386 Other specified dorsopathies, lumbar region: Secondary | ICD-10-CM | POA: Diagnosis not present

## 2018-12-09 ENCOUNTER — Ambulatory Visit: Payer: BC Managed Care – PPO | Admitting: Family Medicine

## 2018-12-13 NOTE — Telephone Encounter (Signed)
Patient states generic methylphenidate (CONCERTA) 36 MG PO CR tablet is causing a burning sensation in her stomach and making her feel  Nausea. Patient spoke with insurance company and they stated PCP can clear you for the brand if noted the side effects from the generic. Patient would like a follow up call regarding new brand rx   [id number] [place], [place] [address] AT [address]

## 2018-12-13 NOTE — Telephone Encounter (Signed)
Please advise 

## 2018-12-17 MED ORDER — CONCERTA 36 MG PO TBCR: 36.0000 mg | EXTENDED_RELEASE_TABLET | Freq: Every day | ORAL | 0 refills | Status: DC

## 2018-12-17 NOTE — Telephone Encounter (Signed)
Tell me what  I am to do Does thois mean I put this  Information  On a new  prescription  And send it in as daw?

## 2018-12-17 NOTE — Telephone Encounter (Signed)
Please send in new rx

## 2018-12-17 NOTE — Telephone Encounter (Signed)
Pt called stating she still has not received an answer for this. Please advise.

## 2018-12-17 NOTE — Telephone Encounter (Signed)
Sent in refill as DAW

## 2018-12-17 NOTE — Telephone Encounter (Signed)
Left detailed message that we sent in refill for brand name only not generic crm created okay to disclose

## 2018-12-17 NOTE — Telephone Encounter (Signed)
Yes DAW on new prescrption

## 2018-12-23 ENCOUNTER — Ambulatory Visit (INDEPENDENT_AMBULATORY_CARE_PROVIDER_SITE_OTHER): Payer: BC Managed Care – PPO | Admitting: Psychiatry

## 2018-12-23 ENCOUNTER — Encounter: Payer: Self-pay | Admitting: Psychiatry

## 2018-12-23 ENCOUNTER — Ambulatory Visit: Payer: BC Managed Care – PPO | Admitting: Psychiatry

## 2018-12-23 ENCOUNTER — Other Ambulatory Visit: Payer: Self-pay

## 2018-12-23 DIAGNOSIS — F4322 Adjustment disorder with anxiety: Secondary | ICD-10-CM

## 2018-12-23 NOTE — Progress Notes (Signed)
      [facility] Counselor/Therapist Progress Note  Patient ID: Terri [name], MRN: [id number],    Date: 12/23/2018  Time Spent: 56 minutes   Treatment Type: Individual Therapy  Reported Symptoms: anxious  Mental Status Exam:  Appearance:   Casual     Behavior:  Appropriate  Motor:  Normal  Speech/Language:   Clear and Coherent  Affect:  Appropriate  Mood:  anxious  Thought process:  normal  Thought content:    WNL  Sensory/Perceptual disturbances:    WNL  Orientation:  oriented to person, place, time/date and situation  Attention:  Good  Concentration:  Good  Memory:  WNL  Fund of knowledge:   Good  Insight:    Good  Judgment:   Good  Impulse Control:  Good   Risk Assessment: Danger to Self:  No Self-injurious Behavior: No Danger to Others: No Duty to Warn:no Physical Aggression / Violence:No  Access to Firearms a concern: No  Gang Involvement:No   Subjective: The client states that her Thanksgiving was "weird".  She spent it with her sister and her extended family in [place], [place].  She states her oldest sister and her nephew's wife were fighting.  "It made things very tense."  There was a conflict over the lack of discipline of the client's oldest nephew who was 4.  "He is off the chain."  The client states it was painful to watch how her nephews wife would completely undermine any effort by her nephew to discipline his son. The client's current relationship has continued to go well.  The client and her new boyfriend seem to get along very well.  He is taking her on a birthday vacation to [place], [place].  The client is very pleased and is surprised at how much attention he gives her. The client has some anxiety about the upcoming holidays.  Her mother and her niece will be spending Christmas with her as well as her new boyfriend.  Typically her mother in the past could be very critical and condescending especially towards her boyfriend.  She is  guarded about how this will go but optimistic at the same time.  We discussed appropriate boundaries and making sure she takes care of her self.  The client continues to exercise regularly and uses positive self talk.  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  Interventions: Assertiveness/Communication, Motivational Interviewing, Solution-Oriented/Positive Psychology and Insight-Oriented  Diagnosis:   ICD-10-CM   1. Adjustment disorder with anxiety  F43.22     Plan: Positive self talk, exercise, self-care, assertiveness, boundaries.  Terri [name], [facility]

## 2019-01-01 ENCOUNTER — Telehealth: Payer: Self-pay | Admitting: Internal Medicine

## 2019-01-01 NOTE — Telephone Encounter (Signed)
Pt states she is on CONCERTA 36 MG CR tablet and the pharmacy gave her generic that is making her sick.  Pt needs to know if PCP will fill out a form for her insurance that states she needs name brand only. Pt can be reached at [phone].

## 2019-01-01 NOTE — Telephone Encounter (Signed)
See note

## 2019-01-13 DIAGNOSIS — M5386 Other specified dorsopathies, lumbar region: Secondary | ICD-10-CM | POA: Diagnosis not present

## 2019-01-13 DIAGNOSIS — M9902 Segmental and somatic dysfunction of thoracic region: Secondary | ICD-10-CM | POA: Diagnosis not present

## 2019-01-13 DIAGNOSIS — M9903 Segmental and somatic dysfunction of lumbar region: Secondary | ICD-10-CM | POA: Diagnosis not present

## 2019-01-13 DIAGNOSIS — M9908 Segmental and somatic dysfunction of rib cage: Secondary | ICD-10-CM | POA: Diagnosis not present

## 2019-01-20 ENCOUNTER — Ambulatory Visit (INDEPENDENT_AMBULATORY_CARE_PROVIDER_SITE_OTHER): Payer: BC Managed Care – PPO | Admitting: Psychiatry

## 2019-01-20 ENCOUNTER — Other Ambulatory Visit: Payer: Self-pay

## 2019-01-20 ENCOUNTER — Encounter: Payer: Self-pay | Admitting: Psychiatry

## 2019-01-20 DIAGNOSIS — F4322 Adjustment disorder with anxiety: Secondary | ICD-10-CM | POA: Diagnosis not present

## 2019-01-20 NOTE — Progress Notes (Signed)
      [facility] Counselor/Therapist Progress Note  Patient ID: Terri [name], MRN: [id number],    Date: 01/20/2019  Time Spent: 57 minutes   Treatment Type: Individual Therapy  Reported Symptoms: anxious  Mental Status Exam:  Appearance:   Well Groomed     Behavior:  Appropriate  Motor:  Normal  Speech/Language:   Clear and Coherent  Affect:  Appropriate  Mood:  anxious  Thought process:  normal  Thought content:    WNL  Sensory/Perceptual disturbances:    WNL  Orientation:  oriented to person, place, time/date and situation  Attention:  Good  Concentration:  Good  Memory:  WNL  Fund of knowledge:   Good  Insight:    Good  Judgment:   Good  Impulse Control:  Good   Risk Assessment: Danger to Self:  No Self-injurious Behavior: No Danger to Others: No Duty to Warn:no Physical Aggression / Violence:No  Access to Firearms a concern: No  Gang Involvement:No   Subjective: The client stated that her Christmas holiday was great but very anxiety provoking.  The client had her mother, her niece and her sister along with her boyfriend over through the Christmas holiday.  It was especially momentous that her sister and her mother were in the same room.  The client has been worried about the break in the relationship between her sister and her mother.  This holiday they seemed to be able to manage reconciliation with each other for which the client was very grateful. The client's relationship with her new boyfriend continues to go very well.  She was very anxious about what his expectations might be in terms of food and her very small condominium.  He is very wealthy but does not project that at all.  She agreed. that he was very down-to-earth which was helpful for her. I used the bilateral stimulation hand paddles with the client to help reduce her anxiety from subjective units of distress of 4+ to less than 1 at the end of the session.  She came to the conclusion that she was enough  and that all was well.  She could radically accept the circumstances as they were.  Interventions: Assertiveness/Communication, Motivational Interviewing, Solution-Oriented/Positive Psychology, [facility] Group Desensitization and Reprocessing (EMDR) and Insight-Oriented  Diagnosis:   ICD-10-CM   1. Adjustment disorder with anxiety  F43.22     Plan: Positive self talk, self-care, assertiveness, boundaries, radical acceptance.  Chistian Kasler, [facility]

## 2019-01-22 DIAGNOSIS — M545 Low back pain: Secondary | ICD-10-CM | POA: Diagnosis not present

## 2019-01-23 ENCOUNTER — Other Ambulatory Visit: Payer: Self-pay | Admitting: Orthopaedic Surgery

## 2019-01-23 ENCOUNTER — Telehealth: Payer: Self-pay | Admitting: Internal Medicine

## 2019-01-23 DIAGNOSIS — M545 Low back pain, unspecified: Secondary | ICD-10-CM

## 2019-01-23 NOTE — Telephone Encounter (Signed)
Pt came in and dropped off a Physiological scientist for waiver of brand drug Additional Fee Form to be completed by the provider.  Upon completion pt would like for it to be faxed to [phone].  Placed in provider folder for completion.

## 2019-01-27 DIAGNOSIS — M9903 Segmental and somatic dysfunction of lumbar region: Secondary | ICD-10-CM | POA: Diagnosis not present

## 2019-01-27 DIAGNOSIS — M9902 Segmental and somatic dysfunction of thoracic region: Secondary | ICD-10-CM | POA: Diagnosis not present

## 2019-01-27 DIAGNOSIS — M5386 Other specified dorsopathies, lumbar region: Secondary | ICD-10-CM | POA: Diagnosis not present

## 2019-01-27 DIAGNOSIS — M9908 Segmental and somatic dysfunction of rib cage: Secondary | ICD-10-CM | POA: Diagnosis not present

## 2019-01-27 NOTE — Telephone Encounter (Signed)
On cover my meds

## 2019-01-27 NOTE — Telephone Encounter (Signed)
Tried to start claim in [facility]bs

## 2019-01-29 NOTE — Telephone Encounter (Signed)
Form has been filled out and faxed.

## 2019-02-07 ENCOUNTER — Other Ambulatory Visit: Payer: Self-pay | Admitting: Internal Medicine

## 2019-02-07 MED ORDER — CONCERTA 36 MG PO TBCR: 36.0000 mg | EXTENDED_RELEASE_TABLET | Freq: Every day | ORAL | 0 refills | Status: DC

## 2019-02-07 NOTE — Telephone Encounter (Signed)
Last ov:10/18/2018 Last filled:12/17/2018 Upcoming :04/21/2019

## 2019-02-07 NOTE — Telephone Encounter (Signed)
   Notes to clinic:  Medication is not able to be delegated.    Requested Prescriptions  Pending Prescriptions Disp Refills   CONCERTA 36 MG CR tablet 30 tablet 0    Sig: Take 1 tablet (36 mg total) by mouth daily.      Not Delegated - Psychiatry:  Stimulants/ADHD Failed - 02/07/2019  9:30 AM      Failed - This refill cannot be delegated      Failed - Urine Drug Screen completed in last 360 days.      Failed - Valid encounter within last 3 months    Recent Outpatient Visits           3 months ago Nonallopathic lesion of pelvic region   [name] Judi [place], DO   3 months ago Abnormality of gait   [name] Antoine Primas M, DO   3 months ago Attention deficit hyperactivity disorder (ADHD), unspecified ADHD type   Nature conservation officer at [employer], [name], MD   4 months ago Right foot pain   [facility] -[place] Schneiders, DO   8 months ago Nonallopathic lesion of sacral region   [facility] -[facility] Schneiders, DO       Future Appointments             In 2 months Panosh, [name], MD [facility] [place], [facility]

## 2019-02-07 NOTE — Telephone Encounter (Signed)
Medication: CONCERTA 36 MG CR tablet [[id number]]  ENDED  Has the patient contacted their pharmacy? Yes  (Agent: If no, request that the patient contact the pharmacy for the refill.) (Agent: If yes, when and what did the pharmacy advise?)  Preferred Pharmacy (with phone number or street name): [id number] [place], [place] - [address] AT [address] [facility] [address] [address]  Phone:  [phone] Fax:  [phone]     Agent: Please be advised that RX refills may take up to 3 business days. We ask that you follow-up with your pharmacy.

## 2019-02-12 NOTE — Telephone Encounter (Signed)
Refaxed as requested. 

## 2019-02-12 NOTE — Telephone Encounter (Signed)
Patient states form was not received. She is requesting it be re-sent today to number below.

## 2019-02-13 ENCOUNTER — Encounter: Payer: Self-pay | Admitting: Psychiatry

## 2019-02-13 ENCOUNTER — Other Ambulatory Visit: Payer: Self-pay

## 2019-02-13 ENCOUNTER — Ambulatory Visit (INDEPENDENT_AMBULATORY_CARE_PROVIDER_SITE_OTHER): Payer: BC Managed Care – PPO | Admitting: Psychiatry

## 2019-02-13 ENCOUNTER — Telehealth: Payer: Self-pay | Admitting: Internal Medicine

## 2019-02-13 DIAGNOSIS — F4322 Adjustment disorder with anxiety: Secondary | ICD-10-CM

## 2019-02-13 NOTE — Progress Notes (Signed)
      [facility] Counselor/Therapist Progress Note  Patient ID: Fatoumata Albaugh, MRN: [id number],    Date: 02/13/2019  Time Spent: 55 minutes   Treatment Type: Individual Therapy  Reported Symptoms: anxiety  Mental Status Exam:  Appearance:   Casual     Behavior:  Appropriate  Motor:  Normal  Speech/Language:   Clear and Coherent  Affect:  Appropriate  Mood:  anxious  Thought process:  normal  Thought content:    WNL  Sensory/Perceptual disturbances:    WNL  Orientation:  oriented to person, place, time/date and situation  Attention:  Good  Concentration:  Good  Memory:  WNL  Fund of knowledge:   Good  Insight:    Good  Judgment:   Good  Impulse Control:  Good   Risk Assessment: Danger to Self:  No Self-injurious Behavior: No Danger to Others: No Duty to Warn:no Physical Aggression / Violence:No  Access to Firearms a concern: No  Gang Involvement:No   Subjective: The client was anxiously agitated today.  She has been unable to get her Concerta filled because [employer] [facility] [employer] will not cover it.  Uncovered it costs almost $500.  The client does not have the money to be able to do that.  As a result she is experiencing some withdrawal symptoms and wooziness connected to it.  She is hopeful that her appeal will go through and she will be able to get it tomorrow. The client states that things are going well with her new boyfriend.  She finds that he can be in her space a little bit too much.  She was trying to figure out how to set the boundary to get some time by herself.  We discussed that the client's sister and mother are also calling her all the time.  She might have a conversation with the boyfriend pointing this out and expressing her need for time by herself.  She might even ask him for help in getting that.  She thought this would be helpful and she Nasier Thumm approach him tonight about that. Otherwise the client is very pleased with how her relationship is going.  She  feels he is very appropriate, supportive and loving.  She seems to be excited being with him as he does with her.  I discussed with the client to make sure that she goes slowly and does not suddenly commit herself to something she is not quite prepared for.  She agreed.  Interventions: Assertiveness/Communication, Motivational Interviewing, Solution-Oriented/Positive Psychology and Insight-Oriented  Diagnosis:   ICD-10-CM   1. Adjustment disorder with anxiety  F43.22     Plan: Boundaries, self-care, assertiveness, positive self talk, follow-up on appeal with [facility], talk to physician about other options besides Concerta.  [name] Jaylenne Hamelin, [facility]

## 2019-02-13 NOTE — Telephone Encounter (Signed)
Pt called in stated she is trying to get her medication pre-authorized and her insurance Herbalist) had faxed over a paper regarding that medication (Concerta) needing that form filled out and faxed back to the insurance explaining why generic brand will not work. (form is in [employer] and if she can mark it urgent when faxing back). Pt is also wondering if it is going to take several days if she can be provided samples? Pt can be contacted at [phone] if necessary

## 2019-02-14 NOTE — Telephone Encounter (Signed)
Form has already been filled out and faxed back we do not have samples of this since it is a controlled substance left detailed message on pts voicemail

## 2019-02-17 ENCOUNTER — Other Ambulatory Visit: Payer: Self-pay | Admitting: Internal Medicine

## 2019-02-17 ENCOUNTER — Telehealth: Payer: Self-pay | Admitting: Internal Medicine

## 2019-02-17 MED ORDER — METHYLPHENIDATE HCL ER (OSM) 36 MG PO TBCR: 36.0000 mg | EXTENDED_RELEASE_TABLET | Freq: Every day | ORAL | 0 refills | Status: DC

## 2019-02-17 NOTE — Telephone Encounter (Signed)
Last ov:10/18/2018 Last filled:02/07/2019 pharmacy wont fill cause it says DAW

## 2019-02-17 NOTE — Telephone Encounter (Signed)
Terri [name], [facility] [place], PT: medication request is denied, Medication: Concerta. Alternative medications have not been tried

## 2019-02-17 NOTE — Telephone Encounter (Signed)
Pt call and need generic for concerta  Because insurance co denied the original .

## 2019-02-17 NOTE — Telephone Encounter (Signed)
Pt needs generic of concerta

## 2019-02-17 NOTE — Telephone Encounter (Signed)
Sent in today 

## 2019-02-17 NOTE — Telephone Encounter (Signed)
Lvm letting pt know this was sent in

## 2019-02-17 NOTE — Telephone Encounter (Signed)
Patient states pharmacy told her that she would need another prescription called in for the generic brand for Concerta. Patient has been out of medication for 4 days.  [facility]

## 2019-02-19 ENCOUNTER — Other Ambulatory Visit (INDEPENDENT_AMBULATORY_CARE_PROVIDER_SITE_OTHER): Payer: BC Managed Care – PPO

## 2019-02-19 ENCOUNTER — Encounter: Payer: Self-pay | Admitting: Internal Medicine

## 2019-02-19 ENCOUNTER — Ambulatory Visit (INDEPENDENT_AMBULATORY_CARE_PROVIDER_SITE_OTHER): Payer: BC Managed Care – PPO | Admitting: Internal Medicine

## 2019-02-19 ENCOUNTER — Other Ambulatory Visit: Payer: Self-pay

## 2019-02-19 VITALS — BP 118/64 | HR 100 | Temp 97.8°F | Wt 122.4 lb

## 2019-02-19 DIAGNOSIS — R3989 Other symptoms and signs involving the genitourinary system: Secondary | ICD-10-CM

## 2019-02-19 DIAGNOSIS — F909 Attention-deficit hyperactivity disorder, unspecified type: Secondary | ICD-10-CM

## 2019-02-19 DIAGNOSIS — Z79899 Other long term (current) drug therapy: Secondary | ICD-10-CM

## 2019-02-19 LAB — URINALYSIS, ROUTINE W REFLEX MICROSCOPIC
Bilirubin Urine: NEGATIVE
Ketones, ur: 15 — AB
Nitrite: NEGATIVE
Specific Gravity, Urine: 1.025 (ref 1.000–1.030)
Total Protein, Urine: NEGATIVE
Urine Glucose: NEGATIVE
Urobilinogen, UA: 0.2 (ref 0.0–1.0)
pH: 6 (ref 5.0–8.0)

## 2019-02-19 MED ORDER — NITROFURANTOIN MONOHYD MACRO 100 MG PO CAPS: 100.0000 mg | ORAL_CAPSULE | Freq: Two times a day (BID) | ORAL | 0 refills | Status: AC

## 2019-02-19 MED ORDER — FLUCONAZOLE 150 MG PO TABS: 150.0000 mg | ORAL_TABLET | Freq: Once | ORAL | 0 refills | Status: AC

## 2019-02-19 NOTE — Progress Notes (Signed)
This visit occurred during the SARS-CoV-2 public health emergency.  Safety protocols were in place, including screening questions prior to the visit, additional usage of staff PPE, and extensive cleaning of exam room while observing appropriate contact time as indicated for disinfecting solutions.    Chief Complaint  Patient presents with  . Urinary Tract Infection    HPI: Terri [name] 54 y.o. come in for   Possible UTI    Changed soap and fragrances and then 4-5 days ago .     Lower abd like fire   And worse . Using urostat  And he;ped some and continued.  Urgency frequency   Dysuria  And urgency and never had uti  No vag sx  But has had Bv im past    Has gyne  .  In regard toa dhd  Problems with constipation and  Nausea with the generic but for now trying again cause of cost  Will get [place] copy of other meds  To try  On insurance issues   ROS: See pertinent positives and negatives per HPI. Gets yeast vag easy and is senisitieve to the topical conazoles  Nausea and constipation   On  Brand   And tried generic again.  Past Medical History:  Diagnosis Date  . Anxiety   . Depression   . Environmental allergies   . GERD (gastroesophageal reflux disease)   . Headache(784.0)   . Recurrent sinusitis    ent and pulm eval in past.    Family History  Problem Relation Age of Onset  . Heart disease Unknown        fhx    Social History   Socioeconomic History  . Marital status: Single    Spouse name: Not on file  . Number of children: Not on file  . Years of education: Not on file  . Highest education level: Not on file  Occupational History  . Not on file  Tobacco Use  . Smoking status: Never Smoker  . Smokeless tobacco: Never Used  Substance and Sexual Activity  . Alcohol use: Yes    Comment: Occasional use  . Drug use: No  . Sexual activity: Yes    Birth control/protection: Pill  Other Topics Concern  . 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 Difficulty of Paying Living Expenses: Not on file  Food Insecurity:   . Worried About Charity fundraiser in the Last Year: Not on file  . Ran Out of Food in the Last Year: Not on file  Transportation Needs:   . Lack of Transportation (Medical): Not on file  . Lack of Transportation (Non-Medical): Not on file  Physical Activity:   . Days of Exercise per Week: Not on file  . Minutes of Exercise per Session: Not on file  Stress:   . Feeling of Stress : Not on file  Social Connections:   . Frequency of Communication with Friends and Family: Not on file  . Frequency of Social Gatherings with Friends and Family: Not on file  . Attends Religious Services: Not on file  . Active Member of Clubs or Organizations: Not on file  . Attends Archivist Meetings: Not on file  . Marital Status: Not on file    Outpatient Medications Prior to Visit  Medication Sig Dispense Refill  . ALPRAZolam Duanne Moron)  0.25 MG tablet Take 1 tablet (0.25 mg total) by mouth 3 (three) times daily as needed for anxiety (panic). 20 tablet 0  . buPROPion (WELLBUTRIN XL) 150 MG 24 hr tablet TAKE 2 TABLETS (300 MG TOTAL) BY MOUTH DAILY. 60 tablet 1  . buPROPion (WELLBUTRIN XL) 300 MG 24 hr tablet TAKE 1 TABLET BY MOUTH EVERY DAY 30 tablet 6  . diazepam (VALIUM) 5 MG tablet One tab by mouth, 2 hours before procedure. 2 tablet 0  . fish oil-omega-3 fatty acids 1000 MG capsule Take 1 g by mouth daily.      . fluconazole (DIFLUCAN) 150 MG tablet 1 pill today then repeat in 4 days 2 tablet 0  . fluticasone (FLONASE) 50 MCG/ACT nasal spray SPRAY 2 SPRAYS INTO EACH NOSTRIL EVERY DAY 16 g 11  . gabapentin (NEURONTIN) 100 MG capsule TAKE 2 CAPSULES BY MOUTH EVERY DAY AT BEDTIME 60 capsule 3  . LYSINE PO Take 1,000 mg by mouth daily. for fever blisters.    . methylphenidate (CONCERTA) 36 MG PO CR tablet Take 1 tablet (36 mg total) by mouth daily.  Generic 30 tablet 0  . Norethin-Eth Estrad-Fe Biphas (LO LOESTRIN FE PO) Take by mouth.    . Nutritional Supplements (DHEA PO) Take by mouth.    . spironolactone (ALDACTONE) 25 MG tablet Take 25 mg by mouth daily.    . SUMAtriptan (IMITREX) 100 MG tablet Take 1 at onset of  Migraine headache   and can repeat in 2 hours . 9 tablet 1  . valACYclovir (VALTREX) 1000 MG tablet TAKE 1 TABLET (1,000 MG TOTAL) BY MOUTH 2 (TWO) TIMES DAILY. 30 tablet 1  . Vitamin D, Ergocalciferol, (DRISDOL) 50000 units CAPS capsule TAKE 1 CAPSULE EVERY 7 DAYS 12 capsule 0   No facility-administered medications prior to visit.     EXAM:  BP 118/64 (BP Location: Right Arm, Patient Position: Sitting, Cuff Size: Normal)   Pulse 100   Temp 97.8 F (36.6 C) (Temporal)   Wt 122 lb 6.4 oz (55.5 kg)   LMP 07/19/2017 (Approximate)   SpO2 98%   BMI 21.01 kg/m   Body mass index is 21.01 kg/m.  GENERAL: vitals reviewed and listed above, alert, oriented, appears well hydrated and in no acute distress HEENT: atraumatic, conjunctiva  clear, no obvious abnormalities on inspection of external nose and ears OP : masked  NECK: no obvious masses on inspection palpation  LUNGS: clear to auscultation bilaterally, no wheezes, rales or rhonchi, good air movement CV: HRRR, no clubbing cyanosis or  peripheral edema nl cap refill  abd soft  Mild tendersnsss  spra pubic area  No g or r  MS: moves all extremities without noticeable focal  abnormality PSYCH: pleasant and cooperative, no obvious depression or anxiety Lab Results  Component Value Date   GLUCOSE 103 (H) 07/12/2015   ALT 20 07/12/2015   AST 20 07/12/2015   NA 137 07/12/2015   K 3.7 07/12/2015   CL 105 07/12/2015   CREATININE 0.99 07/12/2015   BUN 14 07/12/2015   CO2 22 07/12/2015   BP Readings from Last 3 Encounters:  02/19/19 118/64  11/06/18 114/80  09/25/18 124/72  UA pos wbc  Rbc bacteria  u cx pending   ASSESSMENT AND PLAN:  Discussed the following  assessment and plan:  Possible urinary tract infection  Medication management  Attention deficit hyperactivity disorder (ADHD), unspecified ADHD type macrobid and diflucan if needed   Counseled. About utis and   Expectant  management.    Trying generic concerta  again    -Patient advised to return or notify health care team  if  new concerns arise.  Patient Instructions   You have a uti  Begin the antibiotic    Will let you know culture  Is back  ;et [place] know if not getting better .  Results for orders placed or performed in visit on 02/19/19 (from the past 24 hour(s))  Urinalysis with Reflex Microscopic     Status: Abnormal   Collection Time: 02/19/19  2:11 PM  Result Value Ref Range   Color, Urine YELLOW Yellow;Lt. Yellow;Straw;Dark Yellow;Amber;Green;Red;Brown   APPearance Sl Cloudy (A) Clear;Turbid;Slightly Cloudy;Cloudy   Specific Gravity, Urine 1.025 1.000 - 1.030   pH 6.0 5.0 - 8.0   Total Protein, Urine NEGATIVE Negative   Urine Glucose NEGATIVE Negative   Ketones, ur 15 (A) Negative   Bilirubin Urine NEGATIVE Negative   Hgb urine dipstick MODERATE (A) Negative   Urobilinogen, UA 0.2 0.0 - 1.0   Leukocytes,Ua SMALL (A) Negative   Nitrite NEGATIVE Negative   WBC, UA 21-50/hpf (A) 0-2/hpf   RBC / HPF 3-6/hpf (A) 0-2/hpf   Mucus, UA Presence of (A) None   Squamous Epithelial / LPF Few(5-10/hpf) (A) Rare(0-4/hpf)   Bacteria, UA Few(10-50/hpf) (A) None     Urinary Tract Infection, Adult  A urinary tract infection (UTI) is an infection of any part of the urinary tract. The urinary tract includes the kidneys, ureters, bladder, and urethra. These organs make, store, and get rid of urine in the body. Your health care provider may use other names to describe the infection. An upper UTI affects the ureters and kidneys (pyelonephritis). A lower UTI affects the bladder (cystitis) and urethra (urethritis). What are the causes? Most urinary tract infections are caused by bacteria  in your genital area, around the entrance to your urinary tract (urethra). These bacteria grow and cause inflammation of your urinary tract. What increases the risk? You are more likely to develop this condition if:  You have a urinary catheter that stays in place (indwelling).  You are not able to control when you urinate or have a bowel movement (you have incontinence).  You are female and you: ? Use a spermicide or diaphragm for birth control. ? Have low estrogen levels. ? Are pregnant.  You have certain genes that increase your risk (genetics).  You are sexually active.  You take antibiotic medicines.  You have a condition that causes your flow of urine to slow down, such as: ? An enlarged prostate, if you are female. ? Blockage in your urethra (stricture). ? A kidney stone. ? A nerve condition that affects your bladder control (neurogenic bladder). ? Not getting enough to drink, or not urinating often.  You have certain medical conditions, such as: ? Diabetes. ? A weak disease-fighting system (immunesystem). ? Sickle cell disease. ? Gout. ? Spinal cord injury. What are the signs or symptoms? Symptoms of this condition include:  Needing to urinate right away (urgently).  Frequent urination or passing small amounts of urine frequently.  Pain or burning with urination.  Blood in the urine.  Urine that smells bad or unusual.  Trouble urinating.  Cloudy urine.  Vaginal discharge, if you are female.  Pain in the abdomen or the lower back. You may also have:  Vomiting or a decreased appetite.  Confusion.  Irritability or tiredness.  A fever.  Diarrhea. The first symptom in older adults may be confusion.  In some cases, they may not have any symptoms until the infection has worsened. How is this diagnosed? This condition is diagnosed based on your medical history and a physical exam. You may also have other tests, including:  Urine tests.  Blood  tests.  Tests for sexually transmitted infections (STIs). If you have had more than one UTI, a cystoscopy or imaging studies may be done to determine the cause of the infections. How is this treated? Treatment for this condition includes:  Antibiotic medicine.  Over-the-counter medicines to treat discomfort.  Drinking enough water to stay hydrated. If you have frequent infections or have other conditions such as a kidney stone, you may need to see a health care provider who specializes in the urinary tract (urologist). In rare cases, urinary tract infections can cause sepsis. Sepsis is a life-threatening condition that occurs when the body responds to an infection. Sepsis is treated in the hospital with IV antibiotics, fluids, and other medicines. Follow these instructions at home:  Medicines  Take over-the-counter and prescription medicines only as told by your health care provider.  If you were prescribed an antibiotic medicine, take it as told by your health care provider. Do not stop using the antibiotic even if you start to feel better. General instructions  Make sure you: ? Empty your bladder often and completely. Do not hold urine for long periods of time. ? Empty your bladder after sex. ? Wipe from front to back after a bowel movement if you are female. Use each tissue one time when you wipe.  Drink enough fluid to keep your urine pale yellow.  Keep all follow-up visits as told by your health care provider. This is important. Contact a health care provider if:  Your symptoms do not get better after 1-2 days.  Your symptoms go away and then return. Get help right away if you have:  Severe pain in your back or your lower abdomen.  A fever.  Nausea or vomiting. Summary  A urinary tract infection (UTI) is an infection of any part of the urinary tract, which includes the kidneys, ureters, bladder, and urethra.  Most urinary tract infections are caused by bacteria in  your genital area, around the entrance to your urinary tract (urethra).  Treatment for this condition often includes antibiotic medicines.  If you were prescribed an antibiotic medicine, take it as told by your health care provider. Do not stop using the antibiotic even if you start to feel better.  Keep all follow-up visits as told by your health care provider. This is important. This information is not intended to replace advice given to you by your health care provider. Make sure you discuss any questions you have with your health care provider. Document Revised: 12/27/2017 Document Reviewed: 07/19/2017 Elsevier Patient Education  2020 [employer].         Terri [name] M.D.

## 2019-02-19 NOTE — Patient Instructions (Signed)
You have a uti  Begin the antibiotic    Will let you know culture  Is back  ;et [place] know if not getting better .  Results for orders placed or performed in visit on 02/19/19 (from the past 24 hour(s))  Urinalysis with Reflex Microscopic     Status: Abnormal   Collection Time: 02/19/19  2:11 PM  Result Value Ref Range   Color, Urine YELLOW Yellow;Lt. Yellow;Straw;Dark Yellow;Amber;Green;Red;Brown   APPearance Sl Cloudy (A) Clear;Turbid;Slightly Cloudy;Cloudy   Specific Gravity, Urine 1.025 1.000 - 1.030   pH 6.0 5.0 - 8.0   Total Protein, Urine NEGATIVE Negative   Urine Glucose NEGATIVE Negative   Ketones, ur 15 (A) Negative   Bilirubin Urine NEGATIVE Negative   Hgb urine dipstick MODERATE (A) Negative   Urobilinogen, UA 0.2 0.0 - 1.0   Leukocytes,Ua SMALL (A) Negative   Nitrite NEGATIVE Negative   WBC, UA 21-50/hpf (A) 0-2/hpf   RBC / HPF 3-6/hpf (A) 0-2/hpf   Mucus, UA Presence of (A) None   Squamous Epithelial / LPF Few(5-10/hpf) (A) Rare(0-4/hpf)   Bacteria, UA Few(10-50/hpf) (A) None     Urinary Tract Infection, Adult  A urinary tract infection (UTI) is an infection of any part of the urinary tract. The urinary tract includes the kidneys, ureters, bladder, and urethra. These organs make, store, and get rid of urine in the body. Your health care provider may use other names to describe the infection. An upper UTI affects the ureters and kidneys (pyelonephritis). A lower UTI affects the bladder (cystitis) and urethra (urethritis). What are the causes? Most urinary tract infections are caused by bacteria in your genital area, around the entrance to your urinary tract (urethra). These bacteria grow and cause inflammation of your urinary tract. What increases the risk? You are more likely to develop this condition if:  You have a urinary catheter that stays in place (indwelling).  You are not able to control when you urinate or have a bowel movement (you have  incontinence).  You are female and you: ? Use a spermicide or diaphragm for birth control. ? Have low estrogen levels. ? Are pregnant.  You have certain genes that increase your risk (genetics).  You are sexually active.  You take antibiotic medicines.  You have a condition that causes your flow of urine to slow down, such as: ? An enlarged prostate, if you are female. ? Blockage in your urethra (stricture). ? A kidney stone. ? A nerve condition that affects your bladder control (neurogenic bladder). ? Not getting enough to drink, or not urinating often.  You have certain medical conditions, such as: ? Diabetes. ? A weak disease-fighting system (immunesystem). ? Sickle cell disease. ? Gout. ? Spinal cord injury. What are the signs or symptoms? Symptoms of this condition include:  Needing to urinate right away (urgently).  Frequent urination or passing small amounts of urine frequently.  Pain or burning with urination.  Blood in the urine.  Urine that smells bad or unusual.  Trouble urinating.  Cloudy urine.  Vaginal discharge, if you are female.  Pain in the abdomen or the lower back. You may also have:  Vomiting or a decreased appetite.  Confusion.  Irritability or tiredness.  A fever.  Diarrhea. The first symptom in older adults may be confusion. In some cases, they may not have any symptoms until the infection has worsened. How is this diagnosed? This condition is diagnosed based on your medical history and a physical exam. You  may also have other tests, including:  Urine tests.  Blood tests.  Tests for sexually transmitted infections (STIs). If you have had more than one UTI, a cystoscopy or imaging studies may be done to determine the cause of the infections. How is this treated? Treatment for this condition includes:  Antibiotic medicine.  Over-the-counter medicines to treat discomfort.  Drinking enough water to stay hydrated. If you have  frequent infections or have other conditions such as a kidney stone, you may need to see a health care provider who specializes in the urinary tract (urologist). In rare cases, urinary tract infections can cause sepsis. Sepsis is a life-threatening condition that occurs when the body responds to an infection. Sepsis is treated in the hospital with IV antibiotics, fluids, and other medicines. Follow these instructions at home:  Medicines  Take over-the-counter and prescription medicines only as told by your health care provider.  If you were prescribed an antibiotic medicine, take it as told by your health care provider. Do not stop using the antibiotic even if you start to feel better. General instructions  Make sure you: ? Empty your bladder often and completely. Do not hold urine for long periods of time. ? Empty your bladder after sex. ? Wipe from front to back after a bowel movement if you are female. Use each tissue one time when you wipe.  Drink enough fluid to keep your urine pale yellow.  Keep all follow-up visits as told by your health care provider. This is important. Contact a health care provider if:  Your symptoms do not get better after 1-2 days.  Your symptoms go away and then return. Get help right away if you have:  Severe pain in your back or your lower abdomen.  A fever.  Nausea or vomiting. Summary  A urinary tract infection (UTI) is an infection of any part of the urinary tract, which includes the kidneys, ureters, bladder, and urethra.  Most urinary tract infections are caused by bacteria in your genital area, around the entrance to your urinary tract (urethra).  Treatment for this condition often includes antibiotic medicines.  If you were prescribed an antibiotic medicine, take it as told by your health care provider. Do not stop using the antibiotic even if you start to feel better.  Keep all follow-up visits as told by your health care provider. This  is important. This information is not intended to replace advice given to you by your health care provider. Make sure you discuss any questions you have with your health care provider. Document Revised: 12/27/2017 Document Reviewed: 07/19/2017 Elsevier Patient Education  2020 [employer].

## 2019-02-21 ENCOUNTER — Ambulatory Visit: Payer: BC Managed Care – PPO | Attending: Internal Medicine

## 2019-02-21 DIAGNOSIS — Z20822 Contact with and (suspected) exposure to covid-19: Secondary | ICD-10-CM

## 2019-02-21 LAB — URINE CULTURE
MICRO NUMBER:: 10086765
SPECIMEN QUALITY:: ADEQUATE

## 2019-02-21 NOTE — Progress Notes (Signed)
Tell patient that urine culture shows e coli  sensitive to medication given . Defintely a UTI Should resolve with current treatment .FU if not better.

## 2019-02-22 LAB — NOVEL CORONAVIRUS, NAA: SARS-CoV-2, NAA: NOT DETECTED

## 2019-02-24 ENCOUNTER — Telehealth: Payer: Self-pay | Admitting: Internal Medicine

## 2019-02-24 DIAGNOSIS — M9903 Segmental and somatic dysfunction of lumbar region: Secondary | ICD-10-CM | POA: Diagnosis not present

## 2019-02-24 DIAGNOSIS — M9902 Segmental and somatic dysfunction of thoracic region: Secondary | ICD-10-CM | POA: Diagnosis not present

## 2019-02-24 DIAGNOSIS — M5386 Other specified dorsopathies, lumbar region: Secondary | ICD-10-CM | POA: Diagnosis not present

## 2019-02-24 DIAGNOSIS — M9908 Segmental and somatic dysfunction of rib cage: Secondary | ICD-10-CM | POA: Diagnosis not present

## 2019-02-24 NOTE — Telephone Encounter (Signed)
Pt is returning call to [facility] and would like a call back today she stated she is going out of town

## 2019-02-24 NOTE — Telephone Encounter (Signed)
Lvm for pt to call back. 

## 2019-02-24 NOTE — Telephone Encounter (Signed)
Spoke with patient about results pt states she feels medication worked for uti and nothing further needed

## 2019-03-07 ENCOUNTER — Other Ambulatory Visit: Payer: Self-pay

## 2019-03-07 ENCOUNTER — Telehealth: Payer: Self-pay | Admitting: Internal Medicine

## 2019-03-07 MED ORDER — VALACYCLOVIR HCL 1 G PO TABS: ORAL_TABLET | ORAL | 1 refills | Status: DC

## 2019-03-07 NOTE — Telephone Encounter (Signed)
Medication has been sent in 

## 2019-03-07 NOTE — Telephone Encounter (Signed)
Pt has a fever blister and is requesting a refill on valACYclovir (VALTREX) 1000 MG tablet. Pt uses [facility] [address] Thanks

## 2019-03-11 ENCOUNTER — Ambulatory Visit (INDEPENDENT_AMBULATORY_CARE_PROVIDER_SITE_OTHER): Payer: BC Managed Care – PPO | Admitting: Psychiatry

## 2019-03-11 ENCOUNTER — Encounter: Payer: Self-pay | Admitting: Psychiatry

## 2019-03-11 ENCOUNTER — Other Ambulatory Visit: Payer: Self-pay

## 2019-03-11 DIAGNOSIS — F4322 Adjustment disorder with anxiety: Secondary | ICD-10-CM

## 2019-03-11 NOTE — Progress Notes (Signed)
      [facility] Counselor/Therapist Progress Note  Patient ID: Terri [name], MRN: [id number],    Date: 03/11/2019  Time Spent: 50 minutes   Treatment Type: Individual Therapy  Reported Symptoms: anxiety  Mental Status Exam:  Appearance:   Well Groomed     Behavior:  Appropriate  Motor:  Normal  Speech/Language:   Clear and Coherent  Affect:  Appropriate  Mood:  anxious  Thought process:  normal  Thought content:    WNL  Sensory/Perceptual disturbances:    WNL  Orientation:  oriented to person, place, time/date and situation  Attention:  Good  Concentration:  Good  Memory:  WNL  Fund of knowledge:   Good  Insight:    Good  Judgment:   Good  Impulse Control:  Good   Risk Assessment: Danger to Self:  No Self-injurious Behavior: No Danger to Others: No Duty to Warn:no Physical Aggression / Violence:No  Access to Firearms a concern: No  Gang Involvement:No   Subjective: The client has some ongoing anxiety about her new relationship.  She has just returned from a trip to [place] with him.  She said it went very well.  One of her concerns is his own insecurity.  She is surprised that as well established as he is and successful that he would have any insecurity.  She does note that he can be needy which is a concern for her.  We discussed boundaries and how she can continue to take care of herself within this relationship.  She continues to move slowly and not letting herself get ahead of things.  We discussed the timeline to evaluate a relationship as being about 18 months.  The client agrees with this.  She will pay attention to where she feels insecurity and anxiousness.  She will also keep track of her intuitive feeling part as well.  Assertively standing up for herself and asking for what she wants will be important.  The client agrees.  She is reading some books on relationships.  She continues to feel that her boyfriend cares well for her.  I encouraged the client to make  sure that she did not get lost in the midst of the relationship.  She agreed.  Interventions: Assertiveness/Communication, Mindfulness Meditation, Motivational Interviewing, Solution-Oriented/Positive Psychology, Psycho-education/Bibliotherapy and Insight-Oriented  Diagnosis:   ICD-10-CM   1. Adjustment disorder with anxiety  F43.22     Plan: Mood independent behavior, boundaries, assertiveness, positive self talk, self-care, exercise, bibliotherapy.  [name] Berthel Bagnall, [facility]

## 2019-03-14 ENCOUNTER — Other Ambulatory Visit: Payer: Self-pay | Admitting: Internal Medicine

## 2019-03-19 DIAGNOSIS — M5386 Other specified dorsopathies, lumbar region: Secondary | ICD-10-CM | POA: Diagnosis not present

## 2019-03-19 DIAGNOSIS — M9908 Segmental and somatic dysfunction of rib cage: Secondary | ICD-10-CM | POA: Diagnosis not present

## 2019-03-19 DIAGNOSIS — M9903 Segmental and somatic dysfunction of lumbar region: Secondary | ICD-10-CM | POA: Diagnosis not present

## 2019-03-19 DIAGNOSIS — M9902 Segmental and somatic dysfunction of thoracic region: Secondary | ICD-10-CM | POA: Diagnosis not present

## 2019-03-20 ENCOUNTER — Other Ambulatory Visit: Payer: Self-pay | Admitting: Internal Medicine

## 2019-03-20 MED ORDER — METHYLPHENIDATE HCL ER (OSM) 36 MG PO TBCR: 36.0000 mg | EXTENDED_RELEASE_TABLET | Freq: Every day | ORAL | 0 refills | Status: DC

## 2019-03-20 NOTE — Telephone Encounter (Signed)
Patient needs generic version of Concerta called in -- Insurance no longer pays for name brand.  [facility] [address]

## 2019-03-20 NOTE — Telephone Encounter (Signed)
Last ov:02/19/19 Last filled:02/17/19

## 2019-03-21 DIAGNOSIS — M9908 Segmental and somatic dysfunction of rib cage: Secondary | ICD-10-CM | POA: Diagnosis not present

## 2019-03-21 DIAGNOSIS — M5386 Other specified dorsopathies, lumbar region: Secondary | ICD-10-CM | POA: Diagnosis not present

## 2019-03-21 DIAGNOSIS — M9902 Segmental and somatic dysfunction of thoracic region: Secondary | ICD-10-CM | POA: Diagnosis not present

## 2019-03-21 DIAGNOSIS — M9903 Segmental and somatic dysfunction of lumbar region: Secondary | ICD-10-CM | POA: Diagnosis not present

## 2019-03-25 DIAGNOSIS — M9902 Segmental and somatic dysfunction of thoracic region: Secondary | ICD-10-CM | POA: Diagnosis not present

## 2019-03-25 DIAGNOSIS — M9908 Segmental and somatic dysfunction of rib cage: Secondary | ICD-10-CM | POA: Diagnosis not present

## 2019-03-25 DIAGNOSIS — M9903 Segmental and somatic dysfunction of lumbar region: Secondary | ICD-10-CM | POA: Diagnosis not present

## 2019-03-25 DIAGNOSIS — M5386 Other specified dorsopathies, lumbar region: Secondary | ICD-10-CM | POA: Diagnosis not present

## 2019-03-28 DIAGNOSIS — M9903 Segmental and somatic dysfunction of lumbar region: Secondary | ICD-10-CM | POA: Diagnosis not present

## 2019-03-28 DIAGNOSIS — M5386 Other specified dorsopathies, lumbar region: Secondary | ICD-10-CM | POA: Diagnosis not present

## 2019-03-28 DIAGNOSIS — M9908 Segmental and somatic dysfunction of rib cage: Secondary | ICD-10-CM | POA: Diagnosis not present

## 2019-03-28 DIAGNOSIS — M9902 Segmental and somatic dysfunction of thoracic region: Secondary | ICD-10-CM | POA: Diagnosis not present

## 2019-04-01 ENCOUNTER — Encounter: Payer: Self-pay | Admitting: Psychiatry

## 2019-04-01 ENCOUNTER — Other Ambulatory Visit: Payer: Self-pay

## 2019-04-01 ENCOUNTER — Ambulatory Visit (INDEPENDENT_AMBULATORY_CARE_PROVIDER_SITE_OTHER): Payer: BC Managed Care – PPO | Admitting: Psychiatry

## 2019-04-01 ENCOUNTER — Other Ambulatory Visit: Payer: Self-pay | Admitting: Internal Medicine

## 2019-04-01 DIAGNOSIS — F4322 Adjustment disorder with anxiety: Secondary | ICD-10-CM

## 2019-04-01 NOTE — Progress Notes (Signed)
      [facility] Counselor/Therapist Progress Note  Patient ID: Terri [name], MRN: [id number],    Date: 04/01/2019  Time Spent: 60 minutes   Treatment Type: Individual Therapy  Reported Symptoms: anxiety  Mental Status Exam:  Appearance:   Well Groomed     Behavior:  Appropriate  Motor:  Normal  Speech/Language:   Clear and Coherent  Affect:  Appropriate  Mood:  anxious  Thought process:  normal  Thought content:    WNL  Sensory/Perceptual disturbances:    WNL  Orientation:  oriented to person, place, time/date and situation  Attention:  Good  Concentration:  Good  Memory:  WNL  Fund of knowledge:   Good  Insight:    Good  Judgment:   Good  Impulse Control:  Good   Risk Assessment: Danger to Self:  No Self-injurious Behavior: No Danger to Others: No Duty to Warn:no Physical Aggression / Violence:No  Access to Firearms a concern: No  Gang Involvement:No   Subjective: The client states that things are going very well with her boyfriend.  She notices when he is very transparent with her that she starts to feel anxious and fearful.  Today we used eye-movement around this.  Focusing on her boyfriend talking to the client transparently.  Her negative cognition is, "it feels scary". She notices anxiety in her stomach.  As the client processed, she saw a picture of herself in a box with the flaps closing over her.  As we processed that image the client remember times when she was a kid when her parents would get very upset with each other.  It caused her to feel anxious and upset.  One time when she was crying her mother came in and told her to quit crying.  As we continue to unpacked this the client discerned that strong emotion will possibly keep transparency is not acceptable.  She thinks this is what is causing her anxiety.  The client decided that she could handle his transparency and did not have to be afraid.  Her subjective units of distress went from a 7+ to less than 2 at  the end of the session.   Interventions: Assertiveness/Communication, Motivational Interviewing, Solution-Oriented/Positive Psychology, [employer] Desensitization and Reprocessing (EMDR) and Insight-Oriented  Diagnosis:   ICD-10-CM   1. Adjustment disorder with anxiety  F43.22     Plan: Mood independent behavior, self-care, positive self talk, assertiveness, boundaries.  Terri [name], [name]

## 2019-04-10 DIAGNOSIS — M9908 Segmental and somatic dysfunction of rib cage: Secondary | ICD-10-CM | POA: Diagnosis not present

## 2019-04-10 DIAGNOSIS — M9903 Segmental and somatic dysfunction of lumbar region: Secondary | ICD-10-CM | POA: Diagnosis not present

## 2019-04-10 DIAGNOSIS — M5386 Other specified dorsopathies, lumbar region: Secondary | ICD-10-CM | POA: Diagnosis not present

## 2019-04-10 DIAGNOSIS — M9902 Segmental and somatic dysfunction of thoracic region: Secondary | ICD-10-CM | POA: Diagnosis not present

## 2019-04-11 ENCOUNTER — Other Ambulatory Visit: Payer: Self-pay

## 2019-04-11 ENCOUNTER — Encounter: Payer: Self-pay | Admitting: Internal Medicine

## 2019-04-11 ENCOUNTER — Telehealth (INDEPENDENT_AMBULATORY_CARE_PROVIDER_SITE_OTHER): Payer: BC Managed Care – PPO | Admitting: Internal Medicine

## 2019-04-11 VITALS — Ht 63.0 in | Wt 155.0 lb

## 2019-04-11 DIAGNOSIS — R0989 Other specified symptoms and signs involving the circulatory and respiratory systems: Secondary | ICD-10-CM | POA: Diagnosis not present

## 2019-04-11 DIAGNOSIS — R040 Epistaxis: Secondary | ICD-10-CM

## 2019-04-11 DIAGNOSIS — H9203 Otalgia, bilateral: Secondary | ICD-10-CM

## 2019-04-11 DIAGNOSIS — J0100 Acute maxillary sinusitis, unspecified: Secondary | ICD-10-CM

## 2019-04-11 MED ORDER — CEFDINIR 300 MG PO CAPS: 300.0000 mg | ORAL_CAPSULE | Freq: Two times a day (BID) | ORAL | 0 refills | Status: AC

## 2019-04-11 NOTE — Progress Notes (Signed)
Virtual Visit via Video Note  I connected with@ on 04/11/19 at 10:00 AM EDT by a video enabled telemedicine application and verified that I am speaking with the correct person using two identifiers. Location patient: home Location provider:work  office Persons participating in the virtual visit: patient, provider  [place] national recommendations  regarding COVID 19 pandemic   video visit is advised over in office visit for this patient.  Patient aware  of the limitations of evaluation and management by telemedicine and  availability of in person appointments. and agreed to proceed.   HPI: Terri [name] presents for video visit  Ongoing sx since January   And this week  Now ear pain  Assoc. Drainage worse  Right more than left . Allergy sx worse recently and   Barometric pressure also Dizzy feeling at times  Claritin d help sears but too drying   Using saline and nose spray  Nose bleed and some blood right   No fever   Hx of recurrent sinus condition in past  ROS: See pertinent positives and negatives per HPI.  Past Medical History:  Diagnosis Date  . Anxiety   . Depression   . Environmental allergies   . GERD (gastroesophageal reflux disease)   . Headache(784.0)   . Recurrent sinusitis    ent and pulm eval in past.    Past Surgical History:  Procedure Laterality Date  . HIP ARTHROSCOPY W/ LABRAL DEBRIDEMENT    . left hip surgery     [facility]    Family History  Problem Relation Age of Onset  . Heart disease Unknown        fhx    Social History   Tobacco Use  . Smoking status: Never Smoker  . Smokeless tobacco: Never Used  Substance Use Topics  . Alcohol use: Yes    Comment: Occasional use  . Drug use: No      Current Outpatient Medications:  .  ALPRAZolam (XANAX) 0.25 MG tablet, Take 1 tablet (0.25 mg total) by mouth 3 (three) times daily as needed for anxiety (panic)., Disp: 20 tablet, Rfl: 0 .  buPROPion (WELLBUTRIN XL) 150 MG 24 hr tablet, TAKE 2 TABLETS (300  MG TOTAL) BY MOUTH DAILY., Disp: 60 tablet, Rfl: 1 .  buPROPion (WELLBUTRIN XL) 300 MG 24 hr tablet, TAKE 1 TABLET BY MOUTH EVERY DAY, Disp: 30 tablet, Rfl: 6 .  fish oil-omega-3 fatty acids 1000 MG capsule, Take 1 g by mouth daily.  , Disp: , Rfl:  .  fluconazole (DIFLUCAN) 150 MG tablet, 1 pill today then repeat in 4 days, Disp: 2 tablet, Rfl: 0 .  fluticasone (FLONASE) 50 MCG/ACT nasal spray, SPRAY 2 SPRAYS INTO EACH NOSTRIL EVERY DAY, Disp: 16 g, Rfl: 11 .  gabapentin (NEURONTIN) 100 MG capsule, TAKE 2 CAPSULES BY MOUTH EVERY DAY AT BEDTIME, Disp: 60 capsule, Rfl: 3 .  LYSINE PO, Take 1,000 mg by mouth daily. for fever blisters., Disp: , Rfl:  .  methylphenidate (CONCERTA) 36 MG PO CR tablet, Take 1 tablet (36 mg total) by mouth daily. Generic, Disp: 30 tablet, Rfl: 0 .  Norethin-Eth Estrad-Fe Biphas (LO LOESTRIN FE PO), Take by mouth., Disp: , Rfl:  .  Nutritional Supplements (DHEA PO), Take by mouth., Disp: , Rfl:  .  spironolactone (ALDACTONE) 25 MG tablet, Take 25 mg by mouth daily., Disp: , Rfl:  .  SUMAtriptan (IMITREX) 100 MG tablet, Take 1 at onset of  Migraine headache   and can repeat in  2 hours ., Disp: 9 tablet, Rfl: 1 .  Vitamin D, Ergocalciferol, (DRISDOL) 50000 units CAPS capsule, TAKE 1 CAPSULE EVERY 7 DAYS, Disp: 12 capsule, Rfl: 0 .  cefdinir (OMNICEF) 300 MG capsule, Take 1 capsule (300 mg total) by mouth 2 (two) times daily for 7 days., Disp: 14 capsule, Rfl: 0 .  valACYclovir (VALTREX) 1000 MG tablet, TAKE 1 TABLET BY MOUTH TWICE A DAY (Patient not taking: Reported on 04/11/2019), Disp: 30 tablet, Rfl: 1  EXAM: BP Readings from Last 3 Encounters:  02/19/19 118/64  11/06/18 114/80  09/25/18 124/72    VITALS per patient if applicable: nl temp  GENERAL: alert, oriented, appears well and in no acute distress looks quite congested  in head  Nl speech and movement  HEENT: atraumatic, conjunttiva clear, no obvious abnormalities on inspection of external nose and ears NECK:  normal movements of the head and neck LUNGS: on inspection no signs of respiratory distress, breathing rate appears normal, no obvious gross SOB, gasping or wheezing CV: no obvious cyanosis  PSYCH/NEURO: pleasant and cooperative, no obvious depression or anxiety, speech and thought processing grossly intact   ASSESSMENT AND PLAN:  Discussed the following assessment and plan:    ICD-10-CM   1. Subacute maxillary sinusitis  J01.00   2. Chronic sinus complaints  R09.89   3. Ear pain, bilateral  H92.03   4. Epistaxis  R04.0    Acute on chronic  With underlying allergy possible  And environmental changes   Add antibiotic    Expectant management. Suspect ear pain may be referred cause hearing is ok vs AOM   omnicef for 7 days and cont sinus hygiene and saline   Make sure BP ok as in past  Fu   If   persistent or progressive  Counseled.   Expectant management and discussion of plan and treatment with opportunity to ask questions and all were answered. The patient agreed with the plan and demonstrated an understanding of the instructions.  30 minutes record review and counseling and plan   And update  Advised to call back or seek an in-person evaluation if worsening  or having  further concerns . Return if symptoms worsen or fail to improve as expected.    Berniece Andreas, MD

## 2019-04-18 ENCOUNTER — Other Ambulatory Visit: Payer: Self-pay | Admitting: Internal Medicine

## 2019-04-20 NOTE — Progress Notes (Signed)
Virtual Visit via Video Note  I connected with@ on 04/21/19 at  3:30 PM EDT by a video enabled telemedicine application and verified that I am speaking with the correct person using two identifiers. Location patient: home Location provider:work  office Persons participating in the virtual visit: patient, provider  [place] national recommendations  regarding COVID 19 pandemic   video visit is advised over in office visit for this patient.  Patient aware  of the limitations of evaluation and management by telemedicine and  availability of in person appointments. and agreed to proceed.   HPI: Terri [name] presents for video visit for fu and med check  Sinus ears better but sinus about 20% better  No fever epistaxis is improved   ADHD/ADD   concernta ok using fiber to help with se of gi  For now  But brand is better    Will stay on for now.   Mood  wellbutrinXL   300 per day doing well   Sees counselor  Needs refill.   No lab or PV in a hwil x gyne   remote hx of colon years ago    Had first covid vaccine  Vomiting se   ROS: See pertinent positives and negatives per HPI.  Past Medical History:  Diagnosis Date  . Anxiety   . Depression   . Environmental allergies   . GERD (gastroesophageal reflux disease)   . Headache(784.0)   . Recurrent sinusitis    ent and pulm eval in past.    Past Surgical History:  Procedure Laterality Date  . HIP ARTHROSCOPY W/ LABRAL DEBRIDEMENT    . left hip surgery     [facility]    Family History  Problem Relation Age of Onset  . Heart disease Unknown        fhx    Social History   Tobacco Use  . Smoking status: Never Smoker  . Smokeless tobacco: Never Used  Substance Use Topics  . Alcohol use: Yes    Comment: Occasional use  . Drug use: No      Current Outpatient Medications:  .  ALPRAZolam (XANAX) 0.25 MG tablet, Take 1 tablet (0.25 mg total) by mouth 3 (three) times daily as needed for anxiety (panic)., Disp: 20 tablet, Rfl: 0 .   buPROPion (WELLBUTRIN XL) 300 MG 24 hr tablet, Take 1 tablet (300 mg total) by mouth daily., Disp: 90 tablet, Rfl: 1 .  fish oil-omega-3 fatty acids 1000 MG capsule, Take 1 g by mouth daily.  , Disp: , Rfl:  .  fluconazole (DIFLUCAN) 150 MG tablet, 1 pill today then repeat in 4 days, Disp: 2 tablet, Rfl: 0 .  fluticasone (FLONASE) 50 MCG/ACT nasal spray, SPRAY 2 SPRAYS INTO EACH NOSTRIL EVERY DAY, Disp: 16 g, Rfl: 11 .  gabapentin (NEURONTIN) 100 MG capsule, TAKE 2 CAPSULES BY MOUTH EVERY DAY AT BEDTIME, Disp: 60 capsule, Rfl: 3 .  LYSINE PO, Take 1,000 mg by mouth daily. for fever blisters., Disp: , Rfl:  .  Norethin-Eth Estrad-Fe Biphas (LO LOESTRIN FE PO), Take by mouth., Disp: , Rfl:  .  Nutritional Supplements (DHEA PO), Take by mouth., Disp: , Rfl:  .  spironolactone (ALDACTONE) 25 MG tablet, Take 25 mg by mouth daily., Disp: , Rfl:  .  SUMAtriptan (IMITREX) 100 MG tablet, Take 1 at onset of  Migraine headache   and can repeat in 2 hours ., Disp: 9 tablet, Rfl: 1 .  valACYclovir (VALTREX) 1000 MG tablet, TAKE 1 TABLET  BY MOUTH TWICE A DAY, Disp: 30 tablet, Rfl: 1 .  Vitamin D, Ergocalciferol, (DRISDOL) 50000 units CAPS capsule, TAKE 1 CAPSULE EVERY 7 DAYS, Disp: 12 capsule, Rfl: 0 .  methylphenidate (CONCERTA) 36 MG PO CR tablet, Take 1 tablet (36 mg total) by mouth daily. Generic, Disp: 30 tablet, Rfl: 0  EXAM: BP Readings from Last 3 Encounters:  02/19/19 118/64  11/06/18 114/80  09/25/18 124/72    VITALS per patient if applicable: no fever  GENERAL: alert, oriented, appears well and in no acute distress HEENT: atraumatic, conjunttiva clear, no obvious abnormalities on inspection of external nose and ears Mild congestion  Looks will  NECK: normal movements of the head and neck LUNGS: on inspection no signs of respiratory distress, breathing rate appears normal, no obvious gross SOB, gasping or wheezing CV: no obvious cyanosis MS: moves all visible extremities without noticeable  abnormality PSYCH/NEURO: pleasant and cooperative, no obvious depression or anxiety, speech and thought processing grossly intact Lab Results  Component Value Date   GLUCOSE 103 (H) 07/12/2015   ALT 20 07/12/2015   AST 20 07/12/2015   NA 137 07/12/2015   K 3.7 07/12/2015   CL 105 07/12/2015   CREATININE 0.99 07/12/2015   BUN 14 07/12/2015   CO2 22 07/12/2015    ASSESSMENT AND PLAN:  Discussed the following assessment and plan:    ICD-10-CM   1. Attention deficit hyperactivity disorder (ADHD), unspecified ADHD type  F90.9 Basic metabolic panel    CBC with Differential/Platelet    Hepatic function panel    Lipid panel    TSH    T4, free  2. Medication management  Z79.899 Basic metabolic panel    CBC with Differential/Platelet    Hepatic function panel    Lipid panel    TSH    T4, free  3. Anxiety and depression  F41.9 Basic metabolic panel   J62.8 CBC with Differential/Platelet    Hepatic function panel    Lipid panel    TSH    T4, free  4. labs for  preventive health examination  Z00.00 Basic metabolic panel    CBC with Differential/Platelet    Hepatic function panel    Lipid panel    TSH    T4, free  5. Epistaxis  R04.0 Basic metabolic panel    CBC with Differential/Platelet    Hepatic function panel    Lipid panel    TSH    T4, free   improved   6. Chronic sinus complaints  R09.89    Continue meds concerta and wellbutrin  For now  Plan lab [name]   and cpx  In summer  Counseled. Contact us if relapsing sinus problem  This week   Expectant management and discussion of plan and treatment with opportunity to ask questions and all were answered. The patient agreed with the plan and demonstrated an understanding of the instructions.  Advised to call back or seek an in-person evaluation if worsening  or having  further concerns . Return if symptoms worsen or fail to improve, for labs ad [facility]   cpx in summer .  Berniece Andreas, MD

## 2019-04-21 ENCOUNTER — Encounter: Payer: Self-pay | Admitting: Internal Medicine

## 2019-04-21 ENCOUNTER — Telehealth (INDEPENDENT_AMBULATORY_CARE_PROVIDER_SITE_OTHER): Payer: BC Managed Care – PPO | Admitting: Internal Medicine

## 2019-04-21 DIAGNOSIS — F329 Major depressive disorder, single episode, unspecified: Secondary | ICD-10-CM

## 2019-04-21 DIAGNOSIS — F909 Attention-deficit hyperactivity disorder, unspecified type: Secondary | ICD-10-CM | POA: Diagnosis not present

## 2019-04-21 DIAGNOSIS — Z0000 Encounter for general adult medical examination without abnormal findings: Secondary | ICD-10-CM | POA: Diagnosis not present

## 2019-04-21 DIAGNOSIS — R0989 Other specified symptoms and signs involving the circulatory and respiratory systems: Secondary | ICD-10-CM

## 2019-04-21 DIAGNOSIS — F419 Anxiety disorder, unspecified: Secondary | ICD-10-CM

## 2019-04-21 DIAGNOSIS — Z79899 Other long term (current) drug therapy: Secondary | ICD-10-CM

## 2019-04-21 DIAGNOSIS — R040 Epistaxis: Secondary | ICD-10-CM

## 2019-04-21 DIAGNOSIS — F32A Depression, unspecified: Secondary | ICD-10-CM

## 2019-04-21 MED ORDER — METHYLPHENIDATE HCL ER (OSM) 36 MG PO TBCR: 36.0000 mg | EXTENDED_RELEASE_TABLET | Freq: Every day | ORAL | 0 refills | Status: DC

## 2019-04-21 MED ORDER — BUPROPION HCL ER (XL) 300 MG PO TB24: 300.0000 mg | ORAL_TABLET | Freq: Every day | ORAL | 1 refills | Status: DC

## 2019-04-22 ENCOUNTER — Other Ambulatory Visit: Payer: Self-pay

## 2019-04-22 ENCOUNTER — Ambulatory Visit (INDEPENDENT_AMBULATORY_CARE_PROVIDER_SITE_OTHER): Payer: BC Managed Care – PPO | Admitting: Psychiatry

## 2019-04-22 DIAGNOSIS — F411 Generalized anxiety disorder: Secondary | ICD-10-CM | POA: Diagnosis not present

## 2019-04-23 ENCOUNTER — Encounter: Payer: Self-pay | Admitting: Psychiatry
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
## 2019-05-06 ENCOUNTER — Other Ambulatory Visit: Payer: Self-pay | Admitting: Internal Medicine

## 2019-05-06 DIAGNOSIS — J310 Chronic rhinitis: Secondary | ICD-10-CM | POA: Diagnosis not present

## 2019-05-06 DIAGNOSIS — H93293 Other abnormal auditory perceptions, bilateral: Secondary | ICD-10-CM | POA: Diagnosis not present

## 2019-05-06 DIAGNOSIS — J343 Hypertrophy of nasal turbinates: Secondary | ICD-10-CM | POA: Diagnosis not present

## 2019-05-06 DIAGNOSIS — J342 Deviated nasal septum: Secondary | ICD-10-CM | POA: Diagnosis not present

## 2019-05-06 DIAGNOSIS — H6983 Other specified disorders of Eustachian tube, bilateral: Secondary | ICD-10-CM | POA: Diagnosis not present

## 2019-05-07 ENCOUNTER — Other Ambulatory Visit: Payer: Self-pay | Admitting: Otolaryngology

## 2019-05-07 DIAGNOSIS — J329 Chronic sinusitis, unspecified: Secondary | ICD-10-CM

## 2019-05-13 ENCOUNTER — Ambulatory Visit: Payer: BC Managed Care – PPO | Admitting: Psychiatry

## 2019-05-19 ENCOUNTER — Other Ambulatory Visit: Payer: Self-pay | Admitting: Internal Medicine

## 2019-05-20 NOTE — Telephone Encounter (Signed)
Last OV 04/21/2019 Video visit  Last filled 05/06/2019, # 30 with 1 refill  Please see 90 day request, the amount is much larger than what we have been sending.  OK to fill?

## 2019-06-02 DIAGNOSIS — M9908 Segmental and somatic dysfunction of rib cage: Secondary | ICD-10-CM | POA: Diagnosis not present

## 2019-06-02 DIAGNOSIS — M9902 Segmental and somatic dysfunction of thoracic region: Secondary | ICD-10-CM | POA: Diagnosis not present

## 2019-06-02 DIAGNOSIS — M9903 Segmental and somatic dysfunction of lumbar region: Secondary | ICD-10-CM | POA: Diagnosis not present

## 2019-06-02 DIAGNOSIS — M5386 Other specified dorsopathies, lumbar region: Secondary | ICD-10-CM | POA: Diagnosis not present

## 2019-06-03 ENCOUNTER — Encounter: Payer: Self-pay | Admitting: Psychiatry

## 2019-06-03 ENCOUNTER — Ambulatory Visit (INDEPENDENT_AMBULATORY_CARE_PROVIDER_SITE_OTHER): Payer: BC Managed Care – PPO | Admitting: Psychiatry

## 2019-06-03 ENCOUNTER — Other Ambulatory Visit: Payer: Self-pay

## 2019-06-03 DIAGNOSIS — F411 Generalized anxiety disorder: Secondary | ICD-10-CM | POA: Diagnosis not present

## 2019-06-03 NOTE — Progress Notes (Signed)
      [facility] Counselor/Therapist Progress Note  Patient ID: Terri [name], MRN: [id number],    Date: 06/03/2019  Time Spent: 56 minutes   Treatment Type: Individual Therapy  Reported Symptoms: anxiety  Mental Status Exam:  Appearance:   Well Groomed     Behavior:  Appropriate  Motor:  Normal  Speech/Language:   Clear and Coherent  Affect:  Appropriate  Mood:  anxious  Thought process:  normal  Thought content:    WNL  Sensory/Perceptual disturbances:    WNL  Orientation:  oriented to person, place, time/date and situation  Attention:  Good  Concentration:  Good  Memory:  WNL  Fund of knowledge:   Good  Insight:    Good  Judgment:   Good  Impulse Control:  Good   Risk Assessment: Danger to Self:  No Self-injurious Behavior: No Danger to Others: No Duty to Warn:no Physical Aggression / Violence:No  Access to Firearms a concern: No  Gang Involvement:No   Subjective: The client was very apologetic today for missing her last appointment.  The day before her appointment she had her second COVID-19 shot.  She stated she was foggy and out of sorts.  She knew she had the appointment but it completely went off her radar in the loss of her focus and attention.  She stated she was sick for 3 or 4 days. The client's relationship continues to go well.  She has some anxiety about the future.  Things have gone so well that they have actually discussed the possibility of being married.  This would mean the client would be moving to [place].  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  She is very anxious about doing this and is not sure if she would be able to do that.  We discussed the fact that all of this is future.  Staying in the present tense makes the most sense.  If she stays in the future she gets more anxious which ruins her present tense but nothing has happened.  The client agrees.  She will try to be more  mindful. The client's mother is 32.  She recently was diagnosed with a UTI.  The client was very worried that her mother was developing dementia but it turns out it was the bacterial infection.  She has brought her mother up to her condominium to stay with her.  Since her mom has been on the antibiotic she has significantly improved.  The client has breathed a sigh of relief.  She continues to work on her self-care, positive self talk and exercise.  She feels like she has good boundaries with her boyfriend and the longer she is in the relationship the more she sees what a great guy he really is.  Interventions: Assertiveness/Communication, Mindfulness Meditation, Motivational Interviewing, Solution-Oriented/Positive Psychology and Insight-Oriented  Diagnosis:   ICD-10-CM   1. Generalized anxiety disorder  F41.1     Plan: Mood independent behavior, mindfulness, positive self talk, self-care, assertiveness, boundaries, exercise.  Terri [name], [facility]

## 2019-06-06 ENCOUNTER — Other Ambulatory Visit: Payer: Self-pay | Admitting: Internal Medicine

## 2019-06-06 MED ORDER — METHYLPHENIDATE HCL ER (OSM) 36 MG PO TBCR: 36.0000 mg | EXTENDED_RELEASE_TABLET | Freq: Every day | ORAL | 0 refills | Status: DC

## 2019-06-06 NOTE — Telephone Encounter (Signed)
Sent in electronically .  

## 2019-06-06 NOTE — Telephone Encounter (Signed)
Okay for refill? Please advise 

## 2019-06-06 NOTE — Telephone Encounter (Signed)
Pt is calling in needing a refill on her methylphenidate (CONCERTA) 36 MG  Pharm:  [facility] [employer]

## 2019-06-12 DIAGNOSIS — M9902 Segmental and somatic dysfunction of thoracic region: Secondary | ICD-10-CM | POA: Diagnosis not present

## 2019-06-12 DIAGNOSIS — M9903 Segmental and somatic dysfunction of lumbar region: Secondary | ICD-10-CM | POA: Diagnosis not present

## 2019-06-12 DIAGNOSIS — M9908 Segmental and somatic dysfunction of rib cage: Secondary | ICD-10-CM | POA: Diagnosis not present

## 2019-06-12 DIAGNOSIS — M5386 Other specified dorsopathies, lumbar region: Secondary | ICD-10-CM | POA: Diagnosis not present

## 2019-06-16 DIAGNOSIS — M9908 Segmental and somatic dysfunction of rib cage: Secondary | ICD-10-CM | POA: Diagnosis not present

## 2019-06-16 DIAGNOSIS — M9903 Segmental and somatic dysfunction of lumbar region: Secondary | ICD-10-CM | POA: Diagnosis not present

## 2019-06-16 DIAGNOSIS — M9902 Segmental and somatic dysfunction of thoracic region: Secondary | ICD-10-CM | POA: Diagnosis not present

## 2019-06-16 DIAGNOSIS — M5386 Other specified dorsopathies, lumbar region: Secondary | ICD-10-CM | POA: Diagnosis not present

## 2019-07-08 ENCOUNTER — Encounter: Payer: Self-pay | Admitting: Family Medicine

## 2019-07-08 ENCOUNTER — Other Ambulatory Visit: Payer: Self-pay

## 2019-07-08 ENCOUNTER — Ambulatory Visit (INDEPENDENT_AMBULATORY_CARE_PROVIDER_SITE_OTHER): Payer: BC Managed Care – PPO

## 2019-07-08 ENCOUNTER — Ambulatory Visit: Payer: BC Managed Care – PPO | Admitting: Family Medicine

## 2019-07-08 VITALS — BP 102/78 | HR 82 | Ht 63.0 in | Wt 128.0 lb

## 2019-07-08 DIAGNOSIS — M999 Biomechanical lesion, unspecified: Secondary | ICD-10-CM

## 2019-07-08 DIAGNOSIS — M542 Cervicalgia: Secondary | ICD-10-CM

## 2019-07-08 DIAGNOSIS — G8929 Other chronic pain: Secondary | ICD-10-CM | POA: Diagnosis not present

## 2019-07-08 DIAGNOSIS — M546 Pain in thoracic spine: Secondary | ICD-10-CM | POA: Diagnosis not present

## 2019-07-08 NOTE — Progress Notes (Signed)
[name] [address] [place] [address] Phone: [phone] Subjective:   Terri [name], am serving as a scribe for Dr. Antoine Primas. This visit occurred during the SARS-CoV-2 public health emergency.  Safety protocols were in place, including screening questions prior to the visit, additional usage of staff PPE, and extensive cleaning of exam room while observing appropriate contact time as indicated for disinfecting solutions.   I'm seeing this patient by the request  of:  Terri [name], Terri Mends, MD  CC: Neck pain, back pain follow-up  [id number]   10/22/2018 Orthotics fitting  Update 07/08/2019 Terri [name] is a 54 y.o. female coming in with complaint of neck pain. Last OMT 05/15/2018. One month ago had swelling and brusing over C7 vertabrae. Has been having sharp pain into the left scapula. Has been icing area. Difficulty rotation left. Feels like her hips may be off as her left pant legs seem to be hanging lower.  Patient states that it seems to give some mild radiation to the arm but nothing severe.  Has not noticed any swelling in her hands that she has had previously.      Past Medical History:  Diagnosis Date  . Anxiety   . Depression   . Environmental allergies   . GERD (gastroesophageal reflux disease)   . Headache(784.0)   . Recurrent sinusitis    ent and pulm eval in past.   Past Surgical History:  Procedure Laterality Date  . HIP ARTHROSCOPY W/ LABRAL DEBRIDEMENT    . left hip surgery     [facility]   Social History   Socioeconomic History  . Marital status: Single    Spouse name: Not on file  . Number of children: Not on file  . Years of education: Not on file  . Highest education level: Not on file  Occupational History  . Not on file  Tobacco Use  . Smoking status: Never Smoker  . Smokeless tobacco: Never Used  Substance and Sexual Activity  . Alcohol use: Yes    Comment: Occasional use  . Drug use: No  . Sexual  activity: Yes    Birth control/protection: Pill  Other Topics Concern  . 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 Difficulty of Paying Living Expenses:   Food Insecurity:   . Worried About Programme researcher, broadcasting/film/video in the Last Year:   . Barista in the Last Year:   Transportation Needs:   . Freight forwarder (Medical):   [name] Lack of Transportation (Non-Medical):   Physical Activity:   . Days of Exercise per Week:   . Minutes of Exercise per Session:   Stress:   . Feeling of Stress :   Social Connections:   . Frequency of Communication with Friends and Family:   . Frequency of Social Gatherings with Friends and Family:   . Attends Religious Services:   . Active Member of Clubs or Organizations:   . Attends Banker Meetings:   [name] Marital Status:    Allergies  Allergen Reactions  . Augmentin [Amoxicillin-Pot Clavulanate] Nausea Only  . Celecoxib     REACTION: unspecified  . Sulfonamide Derivatives     REACTION: hives, throat swelling  . Monistat [Miconazole] Other (See Comments)    Sensitive to topical vaginal  Azoles No  side effects with oral Diflucan   Family History  Problem Relation Age of Onset  . Heart disease Unknown        fhx    Current Outpatient Medications (Endocrine & Metabolic):  [name]  Norethin-Eth Estrad-Fe Biphas (LO LOESTRIN FE PO), Take by mouth.  Current Outpatient Medications (Cardiovascular):  .  spironolactone (ALDACTONE) 25 MG tablet, Take 25 mg by mouth daily.  Current Outpatient Medications (Respiratory):  .  fluticasone (FLONASE) 50 MCG/ACT nasal spray, SPRAY 2 SPRAYS INTO EACH NOSTRIL EVERY DAY  Current Outpatient Medications (Analgesics):  [name]  SUMAtriptan (IMITREX) 100 MG tablet, Take 1 at onset of  Migraine headache   and can repeat in 2 hours .   Current Outpatient Medications (Other):  [name]  ALPRAZolam (XANAX) 0.25 MG  tablet, Take 1 tablet (0.25 mg total) by mouth 3 (three) times daily as needed for anxiety (panic). [name]  buPROPion (WELLBUTRIN XL) 300 MG 24 hr tablet, Take 1 tablet (300 mg total) by mouth daily. .  fish oil-omega-3 fatty acids 1000 MG capsule, Take 1 g by mouth daily.   .  fluconazole (DIFLUCAN) 150 MG tablet, 1 pill today then repeat in 4 days .  gabapentin (NEURONTIN) 100 MG capsule, TAKE 2 CAPSULES BY MOUTH EVERY DAY AT BEDTIME .  LYSINE PO, Take 1,000 mg by mouth daily. for fever blisters. .  Nutritional Supplements (DHEA PO)*, Take by mouth. .  valACYclovir (VALTREX) 1000 MG tablet, TAKE 1 TABLET BY MOUTH TWICE A DAY .  Vitamin D, Ergocalciferol, (DRISDOL) 50000 units CAPS capsule, TAKE 1 CAPSULE EVERY 7 DAYS .  methylphenidate (CONCERTA) 36 MG PO CR tablet, Take 1 tablet (36 mg total) by mouth daily. Generic * These medications belong to multiple therapeutic classes and are listed under each applicable group.   Reviewed prior external information including notes and imaging from  primary care provider As well as notes that were available from care everywhere and other healthcare systems.  Past medical history, social, surgical and family history all reviewed in electronic medical record.  No pertanent information unless stated regarding to the chief complaint.   Review of Systems:  No headache, visual changes, nausea, vomiting, diarrhea, constipation, dizziness, abdominal pain, skin rash, fevers, chills, night sweats, weight loss, swollen lymph nodes,  joint swelling, chest pain, shortness of breath, mood changes. POSITIVE muscle aches, body aches  Objective  Blood pressure 102/78, pulse 82, height 5\' 3"  (1.6 m), weight 128 lb (58.1 kg), last menstrual period 07/19/2017.   General: No apparent distress alert and oriented x3 mood and affect normal, dressed appropriately.  HEENT: Pupils equal, extraocular movements intact  Respiratory: Patient's speak in full sentences and does not  appear short of breath  Cardiovascular: No lower extremity edema, non tender, no erythema  Neuro: Cranial nerves II through XII are intact, neurovascularly intact in all extremities with 2+ DTRs and 2+ pulses.  Gait normal with good balance and coordination.  MSK:  Non tender with full range of motion and good stability and symmetric strength and tone of shoulders, elbows, wrist, hip, knee and ankles bilaterally.  Neck exam shows the patient does have some mild loss of lordosis.  Tender to palpation in the paraspinal musculature of the left cervical thoracic area.  Patient does have what appears to be a mild lipoma that seems to be overlying the C7 and T1 area.  Negative Spurling's.  Tightness noted in the parascapular region left greater than right  Osteopathic findings  C2 flexed rotated and  side bent right C1 flexed rotated and side bent left T5 extended rotated and side bent left inhaled third rib T9 extended rotated and side bent left L4 flexed rotated and side bent right Sacrum right on right    Impression and Recommendations:     The above documentation has been reviewed and is accurate and complete Lyndal Pulley, DO       Note: This dictation was prepared with Dragon dictation along with smaller phrase technology. Any transcriptional errors that result from this process are unintentional.

## 2019-07-08 NOTE — Assessment & Plan Note (Signed)

## 2019-07-08 NOTE — Assessment & Plan Note (Signed)
Multifactorial.  Does seem to have some potential lipoma noted over the C7-T1 area.  We discussed the potential for other things but we will get x-rays to further evaluate the degenerative disc disease that could be contributing.  Discussed multiple different medications including the gabapentin.  Patient did respond fairly well to manipulation.  Follow-up again in 4 to 8 weeks

## 2019-07-08 NOTE — Patient Instructions (Addendum)
Good to see you Xrays today Yoga wheel See me again in 5-7 weeks

## 2019-07-15 ENCOUNTER — Telehealth: Payer: Self-pay | Admitting: Internal Medicine

## 2019-07-15 NOTE — Telephone Encounter (Signed)
Last OV 04/21/2019  Last filled 06/06/2019, # 30 with 0 refills

## 2019-07-15 NOTE — Telephone Encounter (Signed)
Pt is requesting a refill on Concerta 36 mg. [facility] uses [facility]. Thanks

## 2019-07-16 MED ORDER — METHYLPHENIDATE HCL ER (OSM) 36 MG PO TBCR: 36.0000 mg | EXTENDED_RELEASE_TABLET | Freq: Every day | ORAL | 0 refills | Status: DC

## 2019-07-16 NOTE — Addendum Note (Signed)
Addended byMadelin Headings on: 07/16/2019 05:34 PM   Modules accepted: Orders

## 2019-07-16 NOTE — Telephone Encounter (Signed)
Sent in electronically .  

## 2019-07-17 ENCOUNTER — Other Ambulatory Visit: Payer: Self-pay

## 2019-07-18 ENCOUNTER — Encounter: Payer: Self-pay | Admitting: Internal Medicine

## 2019-07-18 ENCOUNTER — Ambulatory Visit (INDEPENDENT_AMBULATORY_CARE_PROVIDER_SITE_OTHER): Payer: BC Managed Care – PPO | Admitting: Internal Medicine

## 2019-07-18 VITALS — BP 118/72 | HR 91 | Temp 98.2°F | Ht 63.0 in | Wt 122.2 lb

## 2019-07-18 DIAGNOSIS — R221 Localized swelling, mass and lump, neck: Secondary | ICD-10-CM

## 2019-07-18 DIAGNOSIS — M542 Cervicalgia: Secondary | ICD-10-CM

## 2019-07-18 DIAGNOSIS — F909 Attention-deficit hyperactivity disorder, unspecified type: Secondary | ICD-10-CM

## 2019-07-18 DIAGNOSIS — Z79899 Other long term (current) drug therapy: Secondary | ICD-10-CM | POA: Diagnosis not present

## 2019-07-18 NOTE — Patient Instructions (Signed)
Contact  Spine specialist to get in for reasons discussed  ? If imagine study  Acts like pinched nerve.  Let us know if need a referral.   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   swelliing may or not be related   Lab when you can
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
## 2019-07-21 DIAGNOSIS — M545 Low back pain: Secondary | ICD-10-CM | POA: Diagnosis not present

## 2019-07-21 DIAGNOSIS — Z6822 Body mass index (BMI) 22.0-22.9, adult: Secondary | ICD-10-CM | POA: Diagnosis not present

## 2019-07-21 DIAGNOSIS — M542 Cervicalgia: Secondary | ICD-10-CM | POA: Diagnosis not present

## 2019-07-24 ENCOUNTER — Other Ambulatory Visit: Payer: Self-pay

## 2019-07-24 ENCOUNTER — Ambulatory Visit (INDEPENDENT_AMBULATORY_CARE_PROVIDER_SITE_OTHER): Payer: BC Managed Care – PPO | Admitting: Psychiatry

## 2019-07-24 ENCOUNTER — Encounter: Payer: Self-pay | Admitting: Psychiatry

## 2019-07-24 DIAGNOSIS — F411 Generalized anxiety disorder: Secondary | ICD-10-CM

## 2019-07-24 NOTE — Progress Notes (Signed)
      [facility] Counselor/Therapist Progress Note  Patient ID: Terri [name], MRN: [id number],    Date: 07/24/2019  Time Spent: 56 minutes   Treatment Type: Individual Therapy  Reported Symptoms: anxiety, stress  Mental Status Exam:  Appearance:   Casual     Behavior:  Appropriate  Motor:  Normal  Speech/Language:   Clear and Coherent  Affect:  Appropriate  Mood:  anxious  Thought process:  normal  Thought content:    WNL  Sensory/Perceptual disturbances:    WNL  Orientation:  oriented to person, place, time/date and situation  Attention:  Good  Concentration:  Good  Memory:  WNL  Fund of knowledge:   Good  Insight:    Good  Judgment:   Good  Impulse Control:  Good   Risk Assessment: Danger to Self:  No Self-injurious Behavior: No Danger to Others: No Duty to Warn:no Physical Aggression / Violence:No  Access to Firearms a concern: No  Gang Involvement:No   Subjective: The client came in today anxious about some developments that have happened in her life.  Her 56 year old mother had collapsed at her home and ended up in the hospital.  The client is convinced that she had a stroke.  Things have not been going well with her mom and the client is very concerned about her recovery and ongoing health.  The client has also been overwhelmed with her work.  She has been scheduled from 8:00 in the morning to 9:30 at night.  She also has developed a swelling at the base of her neck on her spine that is half the size of a baseball. I use the bilateral stimulation hand paddles with the client as she discussed these very stressful issues.  She realizes she is doing the best that she can trying to care for her mom when her mom is being obstinate.  The client does not have the time during the week to call Medicare and the physicians to get things squared away.  She is hopeful that her older sister would be willing to take that task on.  She had not told her sister a lot because she had  been in the hospital with RSV.  She has now recovered and the client feels she could probably help at this point.  Right now the client's mother is doing okay.  It is mostly system issues. The client is also trying to address her health issues as well.  She is hopeful to have an MRI soon to determine what is going on at the back of her neck.  They have ruled out cancer so the client is not as worried as she would have been.  As the client continue to process she clearly was able to reduce her anxiety from the subjective units of distress of 7 to less than 3.  She is still a bit manic because of the prednisone treatment she is getting.  The client realizes this and is trying to increase her self-care.  We covered radical acceptance, self-care and mood independent behavior.  Interventions: Assertiveness/Communication, Motivational Interviewing, Solution-Oriented/Positive Psychology, [employer] Desensitization and Reprocessing (EMDR) and Insight-Oriented  Diagnosis:   ICD-10-CM   1. Generalized anxiety disorder  F41.1     Plan: Mood independent behavior, self-care, positive self talk, assertiveness, boundaries, radical acceptance.  [name] Cristhian Vanhook, [facility]

## 2019-07-31 ENCOUNTER — Other Ambulatory Visit: Payer: Self-pay | Admitting: Internal Medicine

## 2019-08-05 DIAGNOSIS — M542 Cervicalgia: Secondary | ICD-10-CM | POA: Diagnosis not present

## 2019-08-05 DIAGNOSIS — M545 Low back pain: Secondary | ICD-10-CM | POA: Diagnosis not present

## 2019-08-05 DIAGNOSIS — Z6822 Body mass index (BMI) 22.0-22.9, adult: Secondary | ICD-10-CM | POA: Diagnosis not present

## 2019-08-19 ENCOUNTER — Telehealth: Payer: Self-pay | Admitting: Internal Medicine

## 2019-08-19 DIAGNOSIS — M545 Low back pain: Secondary | ICD-10-CM | POA: Diagnosis not present

## 2019-08-19 DIAGNOSIS — M542 Cervicalgia: Secondary | ICD-10-CM | POA: Diagnosis not present

## 2019-08-19 DIAGNOSIS — Z6822 Body mass index (BMI) 22.0-22.9, adult: Secondary | ICD-10-CM | POA: Diagnosis not present

## 2019-08-19 MED ORDER — METHYLPHENIDATE HCL ER (OSM) 36 MG PO TBCR: 36.0000 mg | EXTENDED_RELEASE_TABLET | Freq: Every day | ORAL | 0 refills | Status: DC

## 2019-08-19 NOTE — Telephone Encounter (Signed)
Sent in electronically .  

## 2019-08-19 NOTE — Telephone Encounter (Signed)
Pt is calling in stating that she needs a refill on her methylphenidate (CONCERTA) 36 MG.  [facility] [address]

## 2019-08-19 NOTE — Addendum Note (Signed)
Addended byMadelin Headings on: 08/19/2019 04:44 PM   Modules accepted: Orders

## 2019-08-19 NOTE — Telephone Encounter (Signed)
Last OV 07/18/2019  Last filled 07/16/2019, # 30 with 0 refills

## 2019-08-20 ENCOUNTER — Ambulatory Visit: Payer: BC Managed Care – PPO | Admitting: Family Medicine

## 2019-08-21 ENCOUNTER — Other Ambulatory Visit: Payer: Self-pay

## 2019-08-21 ENCOUNTER — Ambulatory Visit (INDEPENDENT_AMBULATORY_CARE_PROVIDER_SITE_OTHER): Payer: BC Managed Care – PPO | Admitting: Psychiatry

## 2019-08-21 ENCOUNTER — Encounter: Payer: Self-pay | Admitting: Psychiatry

## 2019-08-21 DIAGNOSIS — F411 Generalized anxiety disorder: Secondary | ICD-10-CM

## 2019-08-21 NOTE — Progress Notes (Signed)
      [facility] Counselor/Therapist Progress Note  Patient ID: Terri [name], MRN: [id number],    Date: 08/21/2019  Time Spent: 60 minutes   Treatment Type: Individual Therapy  Reported Symptoms: anxiety  Mental Status Exam:  Appearance:   Casual     Behavior:  Appropriate  Motor:  Normal  Speech/Language:   Clear and Coherent  Affect:  Appropriate  Mood:  anxious  Thought process:  normal  Thought content:    WNL  Sensory/Perceptual disturbances:    WNL  Orientation:  oriented to person, place, time/date and situation  Attention:  Good  Concentration:  Good  Memory:  WNL  Fund of knowledge:   Good  Insight:    Good  Judgment:   Good  Impulse Control:  Good   Risk Assessment: Danger to Self:  No Self-injurious Behavior: No Danger to Others: No Duty to Warn:no Physical Aggression / Violence:No  Access to Firearms a concern: No  Gang Involvement:No   Subjective: The client is anxious today and uncertain.  Her MRI was denied by the insurance.  She has to have 6 weeks of physical therapy 2 hours/week before they will allow an MRI.  They denied her being able to even pay for it out of pocket.  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  She is uncertain the impact it is having on her.  She is planning to make an appointment with the neurologist to follow-up to see what their evaluation is.  The client has been struggling with this for a few months now.  She is planning to travel to [place] next week and is concerned about how she Terri [name] fare physically. We discussed the concept of radical acceptance with her current circumstances.  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  She finds the work very satisfying but physically grueling.  Her mother is continuing to have some difficulties which worries the client as well.  Since she will be gone for almost 10 days her sister will step him to watch over her mother.  The client is convinced  that her mother has had 2 mini strokes and would like her to be evaluated by neurologist to be able to be put on a blood thinner.  She realizes she will have to have patience in the short-term before that can happen. The client's relationship continues to go well.  She has had some struggles with communication and how they fight relationally.  I suggested to the client that maybe they get some relationship counseling going forward.  I also sent the client a list of EMDR therapist [place] that her boyfriend can follow-up with individually.  Interventions: Assertiveness/Communication, Motivational Interviewing, Solution-Oriented/Positive Psychology, [employer] Desensitization and Reprocessing (EMDR) and Insight-Oriented  Diagnosis:   ICD-10-CM   1. Generalized anxiety disorder  F41.1     Plan: Radical acceptance, self-care, positive self talk, assertiveness, boundaries, relationship counseling.  [name] [name], [facility]

## 2019-08-24 DIAGNOSIS — Z20828 Contact with and (suspected) exposure to other viral communicable diseases: Secondary | ICD-10-CM | POA: Diagnosis not present

## 2019-08-25 DIAGNOSIS — M9903 Segmental and somatic dysfunction of lumbar region: Secondary | ICD-10-CM | POA: Diagnosis not present

## 2019-08-25 DIAGNOSIS — M5386 Other specified dorsopathies, lumbar region: Secondary | ICD-10-CM | POA: Diagnosis not present

## 2019-08-25 DIAGNOSIS — M9902 Segmental and somatic dysfunction of thoracic region: Secondary | ICD-10-CM | POA: Diagnosis not present

## 2019-08-25 DIAGNOSIS — M9908 Segmental and somatic dysfunction of rib cage: Secondary | ICD-10-CM | POA: Diagnosis not present

## 2019-09-09 ENCOUNTER — Encounter: Payer: Self-pay | Admitting: Psychiatry

## 2019-09-09 ENCOUNTER — Other Ambulatory Visit: Payer: Self-pay

## 2019-09-09 ENCOUNTER — Ambulatory Visit (INDEPENDENT_AMBULATORY_CARE_PROVIDER_SITE_OTHER): Payer: BC Managed Care – PPO | Admitting: Psychiatry

## 2019-09-09 DIAGNOSIS — G8929 Other chronic pain: Secondary | ICD-10-CM | POA: Diagnosis not present

## 2019-09-09 DIAGNOSIS — F411 Generalized anxiety disorder: Secondary | ICD-10-CM | POA: Diagnosis not present

## 2019-09-09 DIAGNOSIS — M6281 Muscle weakness (generalized): Secondary | ICD-10-CM | POA: Diagnosis not present

## 2019-09-09 DIAGNOSIS — M542 Cervicalgia: Secondary | ICD-10-CM | POA: Diagnosis not present

## 2019-09-09 DIAGNOSIS — R2689 Other abnormalities of gait and mobility: Secondary | ICD-10-CM | POA: Diagnosis not present

## 2019-09-09 NOTE — Progress Notes (Signed)
      [facility] Counselor/Therapist Progress Note  Patient ID: Yakelin Grenier, MRN: [id number],    Date: 09/09/2019  Time Spent: 70 minutes   Treatment Type: Individual Therapy  Reported Symptoms: anxiety  Mental Status Exam:  Appearance:   Casual     Behavior:  Appropriate  Motor:  Normal  Speech/Language:   Clear and Coherent  Affect:  Appropriate  Mood:  anxious  Thought process:  normal  Thought content:    WNL  Sensory/Perceptual disturbances:    WNL  Orientation:  oriented to person, place, time/date and situation  Attention:  Good  Concentration:  Good  Memory:  WNL  Fund of knowledge:   Good  Insight:    Good  Judgment:   Good  Impulse Control:  Good   Risk Assessment: Danger to Self:  No Self-injurious Behavior: No Danger to Others: No Duty to Warn:no Physical Aggression / Violence:No  Access to Firearms a concern: No  Gang Involvement:No   Subjective: The client had been very anxious before her trip to [place] due to her neck pain.  She stated that she had gone to her chiropractor who identified what the lump was about in her neck.  "He did some manipulation and the pain went away."  Client's anxiety had significantly decreased.  We had talked about the necessity to be mindful and staying in the present tense.  The client agreed that she could get into a catastrophizing mode if she took this whole thing into the future.  She has started physical therapy which is going well.  She realized that she needs to exercise her back muscles to compensate for her shoulders. Today we discussed conflict resolution model of anger.  Client has been having fights with her boyfriend that have been unproductive.  I have referred them to Granville Lewis, LCSW for relationship counseling.  I also went over the thinking errors handout with the client discussing at length how these errors interfere with communication.  I explained how engaging in these errors result in disastrous  consequences and misunderstandings.  The client will review this and share it with her boyfriend as well.  Interventions: Assertiveness/Communication, Mindfulness Meditation, Motivational Interviewing and Insight-Oriented  Diagnosis:   ICD-10-CM   1. Generalized anxiety disorder  F41.1     Plan: Mindfulness, conflict resolution model of anger, review thinking errors handout, mood independent behavior, positive self talk, self-care, assertiveness, boundaries.  [name] Arayla Kruschke, [facility]

## 2019-09-22 DIAGNOSIS — Z20828 Contact with and (suspected) exposure to other viral communicable diseases: Secondary | ICD-10-CM | POA: Diagnosis not present

## 2019-09-23 ENCOUNTER — Telehealth: Payer: Self-pay | Admitting: Internal Medicine

## 2019-09-23 DIAGNOSIS — H53143 Visual discomfort, bilateral: Secondary | ICD-10-CM | POA: Diagnosis not present

## 2019-09-23 DIAGNOSIS — H524 Presbyopia: Secondary | ICD-10-CM | POA: Diagnosis not present

## 2019-09-23 NOTE — Telephone Encounter (Signed)
methylphenidate (CONCERTA) 36 MG PO CR tablet(Expired)  [id number] - [place], [place] - [address] AT [facility] [address] [facility] [address] Phone:  [phone]  Fax:  [phone]

## 2019-09-24 MED ORDER — METHYLPHENIDATE HCL ER (OSM) 36 MG PO TBCR: 36.0000 mg | EXTENDED_RELEASE_TABLET | Freq: Every day | ORAL | 0 refills | Status: DC

## 2019-09-24 NOTE — Telephone Encounter (Signed)
Last OV 07/18/2019  Last filled 08/19/2019, # 30 with 0 refills

## 2019-09-24 NOTE — Telephone Encounter (Signed)
Sent in electronically .  

## 2019-09-24 NOTE — Addendum Note (Signed)
Addended byMadelin Headings on: 09/24/2019 06:42 PM   Modules accepted: Orders

## 2019-09-25 DIAGNOSIS — G8929 Other chronic pain: Secondary | ICD-10-CM | POA: Diagnosis not present

## 2019-09-25 DIAGNOSIS — R2689 Other abnormalities of gait and mobility: Secondary | ICD-10-CM | POA: Diagnosis not present

## 2019-09-25 DIAGNOSIS — M6281 Muscle weakness (generalized): Secondary | ICD-10-CM | POA: Diagnosis not present

## 2019-09-25 DIAGNOSIS — M542 Cervicalgia: Secondary | ICD-10-CM | POA: Diagnosis not present

## 2019-10-01 DIAGNOSIS — G8929 Other chronic pain: Secondary | ICD-10-CM | POA: Diagnosis not present

## 2019-10-01 DIAGNOSIS — R2689 Other abnormalities of gait and mobility: Secondary | ICD-10-CM | POA: Diagnosis not present

## 2019-10-01 DIAGNOSIS — M6281 Muscle weakness (generalized): Secondary | ICD-10-CM | POA: Diagnosis not present

## 2019-10-01 DIAGNOSIS — M542 Cervicalgia: Secondary | ICD-10-CM | POA: Diagnosis not present

## 2019-10-03 DIAGNOSIS — M6281 Muscle weakness (generalized): Secondary | ICD-10-CM | POA: Diagnosis not present

## 2019-10-03 DIAGNOSIS — G8929 Other chronic pain: Secondary | ICD-10-CM | POA: Diagnosis not present

## 2019-10-03 DIAGNOSIS — R2689 Other abnormalities of gait and mobility: Secondary | ICD-10-CM | POA: Diagnosis not present

## 2019-10-03 DIAGNOSIS — M542 Cervicalgia: Secondary | ICD-10-CM | POA: Diagnosis not present

## 2019-10-07 ENCOUNTER — Ambulatory Visit: Payer: BC Managed Care – PPO | Admitting: Psychiatry

## 2019-10-07 DIAGNOSIS — Z6822 Body mass index (BMI) 22.0-22.9, adult: Secondary | ICD-10-CM | POA: Diagnosis not present

## 2019-10-07 DIAGNOSIS — R2689 Other abnormalities of gait and mobility: Secondary | ICD-10-CM | POA: Diagnosis not present

## 2019-10-07 DIAGNOSIS — Z01419 Encounter for gynecological examination (general) (routine) without abnormal findings: Secondary | ICD-10-CM | POA: Diagnosis not present

## 2019-10-07 DIAGNOSIS — G8929 Other chronic pain: Secondary | ICD-10-CM | POA: Diagnosis not present

## 2019-10-07 DIAGNOSIS — M6281 Muscle weakness (generalized): Secondary | ICD-10-CM | POA: Diagnosis not present

## 2019-10-07 DIAGNOSIS — M542 Cervicalgia: Secondary | ICD-10-CM | POA: Diagnosis not present

## 2019-10-09 DIAGNOSIS — G8929 Other chronic pain: Secondary | ICD-10-CM | POA: Diagnosis not present

## 2019-10-09 DIAGNOSIS — M6281 Muscle weakness (generalized): Secondary | ICD-10-CM | POA: Diagnosis not present

## 2019-10-09 DIAGNOSIS — R2689 Other abnormalities of gait and mobility: Secondary | ICD-10-CM | POA: Diagnosis not present

## 2019-10-09 DIAGNOSIS — M542 Cervicalgia: Secondary | ICD-10-CM | POA: Diagnosis not present

## 2019-10-16 ENCOUNTER — Encounter: Payer: Self-pay | Admitting: Psychiatry

## 2019-10-16 ENCOUNTER — Ambulatory Visit (INDEPENDENT_AMBULATORY_CARE_PROVIDER_SITE_OTHER): Payer: BC Managed Care – PPO | Admitting: Psychiatry

## 2019-10-16 ENCOUNTER — Other Ambulatory Visit: Payer: Self-pay

## 2019-10-16 DIAGNOSIS — M6281 Muscle weakness (generalized): Secondary | ICD-10-CM | POA: Diagnosis not present

## 2019-10-16 DIAGNOSIS — G8929 Other chronic pain: Secondary | ICD-10-CM | POA: Diagnosis not present

## 2019-10-16 DIAGNOSIS — F411 Generalized anxiety disorder: Secondary | ICD-10-CM | POA: Diagnosis not present

## 2019-10-16 DIAGNOSIS — R2689 Other abnormalities of gait and mobility: Secondary | ICD-10-CM | POA: Diagnosis not present

## 2019-10-16 DIAGNOSIS — M542 Cervicalgia: Secondary | ICD-10-CM | POA: Diagnosis not present

## 2019-10-16 NOTE — Progress Notes (Signed)
      [facility] Counselor/Therapist Progress Note  Patient ID: Terri [name], MRN: [id number],    Date: 10/16/2019  Time Spent: 62 minutes   Treatment Type: Individual Therapy  Reported Symptoms: anxiety  Mental Status Exam:  Appearance:   Casual     Behavior:  Appropriate  Motor:  Normal  Speech/Language:   Clear and Coherent  Affect:  Appropriate  Mood:  anxious  Thought process:  normal  Thought content:    WNL  Sensory/Perceptual disturbances:    WNL  Orientation:  oriented to person, place, time/date and situation  Attention:  Good  Concentration:  Good  Memory:  WNL  Fund of knowledge:   Good  Insight:    Good  Judgment:   Good  Impulse Control:  Good   Risk Assessment: Danger to Self:  No Self-injurious Behavior: No Danger to Others: No Duty to Warn:no Physical Aggression / Violence:No  Access to Firearms a concern: No  Gang Involvement:No   Subjective: The client has been concerned because her neck continues to bother her. Her anxiety is up today to a subjective units of distress of 7. I used the bilateral stimulation hand paddles with the client as she processed her anxiety around the pain that she is having. I pointed out to the client that she has been pursuing medical help for this and is slowly gaining some traction. She agreed. She also states that her boyfriend has been very supportive which has been a positive thing. They have started seeing a relationship counselor. They have been 2 times in the client feels that this therapist is a homerun for them. She thinks her boyfriend will follow-up with the therapist individually when they're done. As she continued to process her subjective units of distress began to drop to less than 3. She felt her neck loosening up some as she let go of more of the tension. She realized that some of it is related to her relationship and some of the conflict that they have had. The client concluded that she has a plan and knows  what she needs to do and will make that happen.  Interventions: Assertiveness/Communication, Motivational Interviewing, Solution-Oriented/Positive Psychology, [employer] Desensitization and Reprocessing (EMDR) and Insight-Oriented  Diagnosis:   ICD-10-CM   1. Generalized anxiety disorder  F41.1     Plan: Boundaries, assertiveness, medical care, exercise, positive self talk, relationship counseling, self-care.  [name] Jeramyah Goodpasture, [facility]

## 2019-10-22 DIAGNOSIS — M542 Cervicalgia: Secondary | ICD-10-CM | POA: Diagnosis not present

## 2019-10-22 DIAGNOSIS — R2689 Other abnormalities of gait and mobility: Secondary | ICD-10-CM | POA: Diagnosis not present

## 2019-10-22 DIAGNOSIS — G8929 Other chronic pain: Secondary | ICD-10-CM | POA: Diagnosis not present

## 2019-10-22 DIAGNOSIS — M6281 Muscle weakness (generalized): Secondary | ICD-10-CM | POA: Diagnosis not present

## 2019-10-24 DIAGNOSIS — M542 Cervicalgia: Secondary | ICD-10-CM | POA: Diagnosis not present

## 2019-10-24 DIAGNOSIS — R2689 Other abnormalities of gait and mobility: Secondary | ICD-10-CM | POA: Diagnosis not present

## 2019-10-24 DIAGNOSIS — G8929 Other chronic pain: Secondary | ICD-10-CM | POA: Diagnosis not present

## 2019-10-24 DIAGNOSIS — M6281 Muscle weakness (generalized): Secondary | ICD-10-CM | POA: Diagnosis not present

## 2019-10-28 DIAGNOSIS — R2689 Other abnormalities of gait and mobility: Secondary | ICD-10-CM | POA: Diagnosis not present

## 2019-10-28 DIAGNOSIS — M542 Cervicalgia: Secondary | ICD-10-CM | POA: Diagnosis not present

## 2019-10-28 DIAGNOSIS — G8929 Other chronic pain: Secondary | ICD-10-CM | POA: Diagnosis not present

## 2019-10-28 DIAGNOSIS — M6281 Muscle weakness (generalized): Secondary | ICD-10-CM | POA: Diagnosis not present

## 2019-10-30 DIAGNOSIS — M6281 Muscle weakness (generalized): Secondary | ICD-10-CM | POA: Diagnosis not present

## 2019-10-30 DIAGNOSIS — R2689 Other abnormalities of gait and mobility: Secondary | ICD-10-CM | POA: Diagnosis not present

## 2019-10-30 DIAGNOSIS — G8929 Other chronic pain: Secondary | ICD-10-CM | POA: Diagnosis not present

## 2019-10-30 DIAGNOSIS — M542 Cervicalgia: Secondary | ICD-10-CM | POA: Diagnosis not present

## 2019-11-01 ENCOUNTER — Other Ambulatory Visit: Payer: Self-pay | Admitting: Internal Medicine

## 2019-11-03 ENCOUNTER — Telehealth: Payer: Self-pay | Admitting: Internal Medicine

## 2019-11-03 NOTE — Telephone Encounter (Signed)
Patient needs a refill on generic Concerta called in- prefers 90 day supply.  Pt has 3 days of supply left.  [facility] [address] [address] [facility]

## 2019-11-05 ENCOUNTER — Other Ambulatory Visit: Payer: Self-pay

## 2019-11-05 ENCOUNTER — Encounter: Payer: Self-pay | Admitting: Psychiatry

## 2019-11-05 ENCOUNTER — Ambulatory Visit (INDEPENDENT_AMBULATORY_CARE_PROVIDER_SITE_OTHER): Payer: BC Managed Care – PPO | Admitting: Psychiatry

## 2019-11-05 DIAGNOSIS — G8929 Other chronic pain: Secondary | ICD-10-CM | POA: Diagnosis not present

## 2019-11-05 DIAGNOSIS — R2689 Other abnormalities of gait and mobility: Secondary | ICD-10-CM | POA: Diagnosis not present

## 2019-11-05 DIAGNOSIS — M6281 Muscle weakness (generalized): Secondary | ICD-10-CM | POA: Diagnosis not present

## 2019-11-05 DIAGNOSIS — F411 Generalized anxiety disorder: Secondary | ICD-10-CM | POA: Diagnosis not present

## 2019-11-05 DIAGNOSIS — M542 Cervicalgia: Secondary | ICD-10-CM | POA: Diagnosis not present

## 2019-11-05 MED ORDER — METHYLPHENIDATE HCL ER (OSM) 36 MG PO TBCR: 36.0000 mg | EXTENDED_RELEASE_TABLET | Freq: Every day | ORAL | 0 refills | Status: DC

## 2019-11-05 NOTE — Progress Notes (Signed)
      [facility] Counselor/Therapist Progress Note  Patient ID: Terri [name], MRN: [id number],    Date: 11/05/2019  Time Spent: 50 minutes   Treatment Type: Individual Therapy  Reported Symptoms: anxious  Mental Status Exam:  Appearance:   Well Groomed     Behavior:  Appropriate  Motor:  Normal  Speech/Language:   Clear and Coherent  Affect:  Appropriate  Mood:  anxious  Thought process:  normal  Thought content:    WNL  Sensory/Perceptual disturbances:    WNL  Orientation:  oriented to person, place, time/date and situation  Attention:  Good  Concentration:  Good  Memory:  WNL  Fund of knowledge:   Good  Insight:    Good  Judgment:   Good  Impulse Control:  Good   Risk Assessment: Danger to Self:  No Self-injurious Behavior: No Danger to Others: No Duty to Warn:no Physical Aggression / Violence:No  Access to Firearms a concern: No  Gang Involvement:No   Subjective: The client comes in today with high level of anxiety.  She identifies it at a subjective units of distress of 9.  She stated that it had started yesterday while she was working.  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  She said she has felt out of sorts ever since.  Today we started with eye-movement around the event.  The client thought about the facial.  She was unable to identify a negative cognition.  She felt anxiety in her stomach and neck.  Her subjective units of distress was a 9.  As she processed she talked about her boyfriend and the difficult time that he has been having.  He is in the middle of a divorce and his son is having a hard time.  The client discussed that her mother has had difficulty with her hearing aid but she has not been able to get over to [place], [place] to help her yet.  She also disclosed that almost every weekend she and her boyfriend have gone somewhere or done something.  As we continued to  process the client began to realize that Terri [name] be she had hit a wall.  She is spending so much time working or traveling somewhere that she is not taking any time for herself.  The client does need some downtime just to be able to manage her individual life and take care of her things.  The client realizes that this Terri [name] be the source of a lot of her anxiety.  I discussed with the client about taking some space in her life for herself and friends.  She said that she would try to do that.  Her subjective units of distress was at a 4. The client and her boyfriend continue in relationship counseling.  She states they both like the counselor and feels like it is going very well.  Interventions: Assertiveness/Communication, Motivational Interviewing, Solution-Oriented/Positive Psychology, [employer] Desensitization and Reprocessing (EMDR) and Insight-Oriented  Diagnosis:   ICD-10-CM   1. Generalized anxiety disorder  F41.1     Plan: Increase self-care, schedule downtime, time with friends/social network, positive self talk, boundaries, assertiveness.  Terri [name], [facility]

## 2019-11-05 NOTE — Telephone Encounter (Signed)
Will send in 90 days   Needs  OV med check before further refills

## 2019-11-12 DIAGNOSIS — G8929 Other chronic pain: Secondary | ICD-10-CM | POA: Diagnosis not present

## 2019-11-12 DIAGNOSIS — M542 Cervicalgia: Secondary | ICD-10-CM | POA: Diagnosis not present

## 2019-11-12 DIAGNOSIS — M6281 Muscle weakness (generalized): Secondary | ICD-10-CM | POA: Diagnosis not present

## 2019-11-12 DIAGNOSIS — R2689 Other abnormalities of gait and mobility: Secondary | ICD-10-CM | POA: Diagnosis not present

## 2019-11-24 ENCOUNTER — Encounter: Payer: Self-pay | Admitting: Family Medicine

## 2019-11-24 ENCOUNTER — Ambulatory Visit (INDEPENDENT_AMBULATORY_CARE_PROVIDER_SITE_OTHER): Payer: BC Managed Care – PPO

## 2019-11-24 ENCOUNTER — Ambulatory Visit (INDEPENDENT_AMBULATORY_CARE_PROVIDER_SITE_OTHER): Payer: BC Managed Care – PPO | Admitting: Family Medicine

## 2019-11-24 ENCOUNTER — Ambulatory Visit: Payer: Self-pay

## 2019-11-24 ENCOUNTER — Other Ambulatory Visit: Payer: Self-pay

## 2019-11-24 VITALS — BP 128/80 | HR 90 | Ht 63.0 in | Wt 127.0 lb

## 2019-11-24 DIAGNOSIS — M5416 Radiculopathy, lumbar region: Secondary | ICD-10-CM

## 2019-11-24 DIAGNOSIS — M999 Biomechanical lesion, unspecified: Secondary | ICD-10-CM

## 2019-11-24 DIAGNOSIS — M25561 Pain in right knee: Secondary | ICD-10-CM | POA: Diagnosis not present

## 2019-11-24 NOTE — Progress Notes (Signed)
[name] [address] [place] [address] Phone: [phone] Subjective:   [name], am serving as a scribe for Dr. Antoine Primas. This visit occurred during the SARS-CoV-2 public health emergency.  Safety protocols were in place, including screening questions prior to the visit, additional usage of staff PPE, and extensive cleaning of exam room while observing appropriate contact time as indicated for disinfecting solutions.   I'm seeing this patient by the request  of:  Panosh, [name], MD  CC: Back pain and hip pain as well as knee pain  FIE:[id number]  Terri [name] is a 54 y.o. female coming in with complaint of back and hip pain. OMT 07/08/2019. Patient states that one month ago patient fell on right glute. Proceeded to fall down into a ravine and hurt her knee. Patient is having right foot numbness while standing. Also feels like she pulled her hamstring after slipping in her kitchen 3 weeks ago.  Patient is wondering if she is just out of alignment.  Has been sometime since we have seen her.  Pain over lateral aspect of knee. Noticeable swelling. Pain is improving but does bother her when she knees down. Patient also bruised left knee when hitting it on bracket underneath a bar top.  States that the right knee and back are more than left knee.  Because of this she has not been working out regularly.     Past Medical History:  Diagnosis Date  . Anxiety   . Depression   . Environmental allergies   . GERD (gastroesophageal reflux disease)   . Headache(784.0)   . Recurrent sinusitis    ent and pulm eval in past.   Past Surgical History:  Procedure Laterality Date  . HIP ARTHROSCOPY W/ LABRAL DEBRIDEMENT    . left hip surgery     [facility]   Social History   Socioeconomic History  . Marital status: Single    Spouse name: Not on file  . Number of children: Not on file  . Years of education: Not on file  . Highest education level:  Not on file  Occupational History  . Not on file  Tobacco Use  . Smoking status: Never Smoker  . Smokeless tobacco: Never Used  Vaping Use  . Vaping Use: Never used  Substance and Sexual Activity  . Alcohol use: Yes    Comment: Occasional use  . Drug use: No  . Sexual activity: Yes    Birth control/protection: Pill  Other Topics Concern  . 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 Difficulty of Paying Living Expenses: Not on file  Food Insecurity:   . Worried About Programme researcher, broadcasting/film/video in the Last Year: Not on file  . Ran Out of Food in the Last Year: Not on file  Transportation Needs:   . Lack of Transportation (Medical): Not on file  . Lack of Transportation (Non-Medical): Not on file  Physical Activity:   . Days of Exercise per Week: Not on file  . Minutes of Exercise per Session: Not on file  Stress:   . Feeling of Stress : Not on file  Social Connections:   . Frequency of Communication with Friends and Family: Not on file  . Frequency of Social Gatherings with Friends and Family: Not on file  . Attends  Religious Services: Not on file  . Active Member of Clubs or Organizations: Not on file  . Attends Banker Meetings: Not on file  . Marital Status: Not on file   Allergies  Allergen Reactions  . Augmentin [Amoxicillin-Pot Clavulanate] Nausea Only  . Celecoxib     REACTION: unspecified  . Sulfa Antibiotics   . Sulfonamide Derivatives     REACTION: hives, throat swelling  . Monistat [Miconazole] Other (See Comments)    Sensitive to topical vaginal  Azoles No side effects with oral Diflucan   Family History  Problem Relation Age of Onset  . Heart disease Other        fhx    Current Outpatient Medications (Endocrine & Metabolic):  [name]  Norethin-Eth Estrad-Fe Biphas (LO LOESTRIN FE PO), Take by mouth.  Current Outpatient Medications  (Cardiovascular):  .  spironolactone (ALDACTONE) 25 MG tablet, Take 25 mg by mouth daily.  Current Outpatient Medications (Respiratory):  .  fluticasone (FLONASE) 50 MCG/ACT nasal spray, SPRAY 2 SPRAYS INTO EACH NOSTRIL EVERY DAY  Current Outpatient Medications (Analgesics):  .  meloxicam (MOBIC) 15 MG tablet, as needed.  .  SUMAtriptan (IMITREX) 100 MG tablet, Take 1 at onset of  Migraine headache   and can repeat in 2 hours .   Current Outpatient Medications (Other):  [name]  ALPRAZolam (XANAX) 0.25 MG tablet, Take 1 tablet (0.25 mg total) by mouth 3 (three) times daily as needed for anxiety (panic). [name]  buPROPion (WELLBUTRIN XL) 300 MG 24 hr tablet, TAKE 1 TABLET BY MOUTH EVERY DAY **NEEDS OV** .  fish oil-omega-3 fatty acids 1000 MG capsule, Take 1 g by mouth daily.   .  fluconazole (DIFLUCAN) 150 MG tablet, 1 pill today then repeat in 4 days .  gabapentin (NEURONTIN) 100 MG capsule, TAKE 2 CAPSULES BY MOUTH EVERY DAY AT BEDTIME .  Hydrocortisone Butyrate 0.1 % OINT, hydrocortisone butyrate 0.1 % topical ointment .  Ivermectin (SOOLANTRA) 1 % CREA, Soolantra 1 % topical cream .  LYSINE PO, Take 1,000 mg by mouth daily. for fever blisters. .  methylphenidate (CONCERTA) 36 MG PO CR tablet, Take 1 tablet (36 mg total) by mouth daily. Generic Need appointment before further refills .  Nutritional Supplements (DHEA PO)*, Take by mouth.  .  valACYclovir (VALTREX) 1000 MG tablet, TAKE 1 TABLET BY MOUTH TWICE A DAY .  Vitamin D, Ergocalciferol, (DRISDOL) 50000 units CAPS capsule, TAKE 1 CAPSULE EVERY 7 DAYS * These medications belong to multiple therapeutic classes and are listed under each applicable group.   Reviewed prior external information including notes and imaging from  primary care provider As well as notes that were available from care everywhere and other healthcare systems.  Past medical history, social, surgical and family history all reviewed in electronic medical record.  No  pertanent information unless stated regarding to the chief complaint.   Review of Systems:  No headache, visual changes, nausea, vomiting, diarrhea, constipation, dizziness, abdominal pain, skin rash, fevers, chills, night sweats, weight loss, swollen lymph nodes, chest pain, shortness of breath, mood changes. POSITIVE muscle aches, body aches  Objective  Blood pressure 128/80, pulse 90, height 5\' 3"  (1.6 m), weight 127 lb (57.6 kg), last menstrual period 07/19/2017, SpO2 98 %.   General: No apparent distress alert and oriented x3 mood and affect normal, dressed appropriately.  HEENT: Pupils equal, extraocular movements intact  Respiratory: Patient's speak in full sentences and does not appear short of breath  Cardiovascular: No lower extremity  edema, non tender, no erythema  Hypermobility still noted. Right knee exam does have a very mild cortical irregularity it feels like palpated on the anterior lateral aspect of the knee.  Tender to palpation.  Possible bone contusion.  Patient has full range of motion of the knee.  Mild lateral tracking of the patella noted.  Good stability of the knee no effusion noted.  Low back exam shows that there is tightness around the sacroiliac joint right greater than left.  Pelvic shear noted. Negative for any true radicular symptoms but does have tightness with straight leg test right greater than left.  Mild tightness with Pearlean Brownie right greater than left as well.  Limited musculoskeletal ultrasound was performed and interpreted by Judi Saa  Limited ultrasound of patient's right knee shows that patient has very minimal arthritic changes of the patellofemoral joint.  Patient anterior lateral aspect does have what appears to be a very small contusion of the bone with still some soft tissue irritation in the area.  Meniscus appear to be unremarkable.  With nonspecific increase in Doppler flow of the MCL noted but otherwise fairly unremarkable  Osteopathic  findings  C5 flexed rotated and side bent right T8 extended rotated and side bent left L1 flexed rotated and side bent right Sacrum right on right Pelvic shear noted right    Impression and Recommendations:     The above documentation has been reviewed and is accurate and complete Judi Saa, DO

## 2019-11-24 NOTE — Patient Instructions (Signed)
Patellar femoral exercises Arnica lotion 2 times a day Knee xray on the way out Ok to continue to be active Back needed adjustment See me again in 6-8 weeks

## 2019-11-24 NOTE — Assessment & Plan Note (Signed)
Patient does have an area that seems to be more of a bone contusion and seems to be on the anterior lateral aspect of the tibia.  Patient will have x-rays to rule out any type of cortical irregularity but I do think patient has full range of motion good stability of the knee.  Likely still more of a bone contusion.  Discussed topical anti-inflammatories icing regimen, follow-up again in 6 to 8 weeks x-rays pending

## 2019-11-24 NOTE — Assessment & Plan Note (Signed)
Chronic problem with mild exacerbation.  Doing relatively well with the home exercise.  Has been sometime.  Does have gabapentin and can increase if necessary at night.  Responded very well to the manipulation well.  Follow-up with me again in 4 to 8 weeks

## 2019-11-26 ENCOUNTER — Ambulatory Visit (INDEPENDENT_AMBULATORY_CARE_PROVIDER_SITE_OTHER): Payer: BC Managed Care – PPO | Admitting: Psychiatry

## 2019-11-26 ENCOUNTER — Encounter: Payer: Self-pay | Admitting: Psychiatry

## 2019-11-26 ENCOUNTER — Other Ambulatory Visit: Payer: Self-pay

## 2019-11-26 DIAGNOSIS — F411 Generalized anxiety disorder: Secondary | ICD-10-CM

## 2019-11-26 NOTE — Progress Notes (Signed)
      [facility] Counselor/Therapist Progress Note  Patient ID: Terri [name], MRN: [id number],    Date: 11/26/2019  Time Spent: 57 minutes   Treatment Type: Individual Therapy  Reported Symptoms: anxiety  Mental Status Exam:  Appearance:   Well Groomed     Behavior:  Appropriate  Motor:  Normal  Speech/Language:   Clear and Coherent  Affect:  Appropriate  Mood:  anxious  Thought process:  normal  Thought content:    WNL  Sensory/Perceptual disturbances:    WNL  Orientation:  oriented to person, place, time/date and situation  Attention:  Good  Concentration:  Good  Memory:  WNL  Fund of knowledge:   Good  Insight:    Good  Judgment:   Good  Impulse Control:  Good   Risk Assessment: Danger to Self:  No Self-injurious Behavior: No Danger to Others: No Duty to Warn:no Physical Aggression / Violence:No  Access to Firearms a concern: No  Gang Involvement:No   Subjective: The client is very anxious today because "something is just not right with my mom."  The client's mom is 56 years old and lives in an independent living facility in [place], [place].  The client thinks that her mother Terri [name] have had a TIA.  Her mother has never really been to the doctor and so does not have any physicians easily available.  I suggested to the client that if her mother did have a TIA she might want to consult with a neurologist.  I suggested [facility] here in [place].  If they feel that her mother needs something else I assured the client they would probably redirect her.  The client will follow-up with them after this appointment. The client states that she and her boyfriend continue to move forward in their relationship.  His divorce is final.  The client states that she is going to meet his ex-wife within the next 2 weeks.  The client states that her boyfriend's 2 children are having difficulty adjusting to the divorce.  Her boyfriend's ex-wife has started dating and the  37 year old son has become very depressed.  It was agreed that the client and the ex-wife would meet before meeting her boyfriend's children.  The client wanted to follow-up on a suggestion from the previous session.  I had suggested that she send a note to the ex-wife expressing an understanding of the ex-wife's position as the mother.  To also assure her that she would not try to parent her children but just be available.  I helped the client craft a note today.  She has a working note that she will run past her boyfriend and no doubt tweak appropriately.  She felt much less anxious at the end of the session having completed this.  She feels good about being able to meet the ex-wife before meeting the children.  She knows that the children will be complicated but she is prepared.  Interventions: Assertiveness/Communication, Motivational Interviewing, Solution-Oriented/Positive Psychology and Insight-Oriented  Diagnosis:   ICD-10-CM   1. Generalized anxiety disorder  F41.1     Plan: Note to her boyfriend's ex-wife, mood independent behavior, assertiveness, boundaries, positive self talk, self-care, follow-up with [facility] neurology for mom.  Terri [name], [facility]

## 2019-11-27 ENCOUNTER — Other Ambulatory Visit: Payer: Self-pay | Admitting: Internal Medicine

## 2019-12-03 DIAGNOSIS — M542 Cervicalgia: Secondary | ICD-10-CM | POA: Diagnosis not present

## 2019-12-03 DIAGNOSIS — R2689 Other abnormalities of gait and mobility: Secondary | ICD-10-CM | POA: Diagnosis not present

## 2019-12-03 DIAGNOSIS — M6281 Muscle weakness (generalized): Secondary | ICD-10-CM | POA: Diagnosis not present

## 2019-12-03 DIAGNOSIS — G8929 Other chronic pain: Secondary | ICD-10-CM | POA: Diagnosis not present

## 2019-12-11 ENCOUNTER — Other Ambulatory Visit: Payer: Self-pay

## 2019-12-11 ENCOUNTER — Ambulatory Visit (INDEPENDENT_AMBULATORY_CARE_PROVIDER_SITE_OTHER): Payer: BC Managed Care – PPO | Admitting: Psychiatry

## 2019-12-11 ENCOUNTER — Encounter: Payer: Self-pay | Admitting: Psychiatry

## 2019-12-11 DIAGNOSIS — M6281 Muscle weakness (generalized): Secondary | ICD-10-CM | POA: Diagnosis not present

## 2019-12-11 DIAGNOSIS — G8929 Other chronic pain: Secondary | ICD-10-CM | POA: Diagnosis not present

## 2019-12-11 DIAGNOSIS — M542 Cervicalgia: Secondary | ICD-10-CM | POA: Diagnosis not present

## 2019-12-11 DIAGNOSIS — F411 Generalized anxiety disorder: Secondary | ICD-10-CM

## 2019-12-11 DIAGNOSIS — R2689 Other abnormalities of gait and mobility: Secondary | ICD-10-CM | POA: Diagnosis not present

## 2019-12-11 NOTE — Progress Notes (Signed)
[facility] Counselor/Therapist Progress Note  Patient ID: Jonia Oakey, MRN: [id number],    Date: 12/11/2019  Time Spent: 58 minutes   Treatment Type: Individual Therapy  Reported Symptoms: anxiety, panic  Mental Status Exam:  Appearance:   Casual     Behavior:  Appropriate  Motor:  Normal  Speech/Language:   Clear and Coherent  Affect:  Appropriate  Mood:  anxious  Thought process:  normal  Thought content:    WNL  Sensory/Perceptual disturbances:    WNL  Orientation:  oriented to person, place, time/date and situation  Attention:  Good  Concentration:  Good  Memory:  WNL  Fund of knowledge:   Good  Insight:    Good  Judgment:   Good  Impulse Control:  Good   Risk Assessment: Danger to Self:  No Self-injurious Behavior: No Danger to Others: No Duty to Warn:no Physical Aggression / Violence:No  Access to Firearms a concern: No  Gang Involvement:No   Subjective: The client states that her mother has an appointment with the neurologist in December.  She is currently doing okay.  The client states that she decided not to send her boyfriend's ex-wife note.  She had difficulty coming up with the right words.  Right before the client was to meet her boyfriend's ex-wife the boyfriend's exfather-in-law was killed in a car accident.  This resulted in quite a bit of family drama.  The client was disappointed with how her boyfriend was treated by his ex-wife and her extended family. The client states that she went to dinner with her boyfriend and another couple.  The couple individually had very successful businesses.  During the course of dinner the client's boyfriend and the wife of his friend got into a deep conversation about easement's.  The conversation became very technical.  The client stated that she began to feel very small and anxious.  She ended up having a panic attack and excused herself to the bathroom.  She realized in retrospect that she had been judging  herself in comparison with this woman.  Today we used the bilateral stimulation hand paddles as the client discussed this circumstance.  She began to experience some anxiety as she discussed it. I challenged the client's internal beliefs about herself.  These include negative cognitions such as, "I do not measure up.  I am not smart, I have fallen short."  I pointed out to the client that her personality style as an ENFJ is head and shoulders above other personalities in terms of emotional intelligence.  What she Sherle Mello lack in data driven intelligence she makes up for in her intuition and relational skills.  I challenged the client that the next time she was in a circumstance like that to use her considerable charm and relational skills to redirect the conversation casually into a direction that she could engage with as well.  The client agreed that she needs to do this.  I asked her to write down the negative scripts and rewrite a positive challenge to it.  She will work on that.  I also encouraged her to use mood independent behavior to redirect her thought process in a mindful way.  Interventions: Assertiveness/Communication, Motivational Interviewing, Solution-Oriented/Positive Psychology, [employer] Desensitization and Reprocessing (EMDR) and Insight-Oriented  Diagnosis:   ICD-10-CM   1. Generalized anxiety disorder  F41.1     Plan: Mood independent behavior, journaling, mindfulness, positive self talk, positive challenge to her negative prescription, assertiveness, boundaries.  Yelitza Reach,  LCMHCS

## 2020-01-01 ENCOUNTER — Telehealth: Payer: Self-pay | Admitting: Internal Medicine

## 2020-01-01 DIAGNOSIS — Z780 Asymptomatic menopausal state: Secondary | ICD-10-CM

## 2020-01-01 NOTE — Telephone Encounter (Signed)
Patient is calling and wanted to speak to someone regarding the a lab test provider ordered, please advise. CB is [phone]

## 2020-01-06 ENCOUNTER — Ambulatory Visit: Payer: BC Managed Care – PPO | Admitting: Psychiatry

## 2020-01-06 NOTE — Telephone Encounter (Signed)
I left a message for the pt to call the office back.

## 2020-01-06 NOTE — Telephone Encounter (Signed)
I spoke with pt. She is going to get the labs done that you ordered for her back in March. Her OB wanted to check a [facility] Estradiol level to check to see if pt is going into menopause. Pt wanted to know if that can be added on and faxed back to them? Pt is aware provider is out until 12/21.

## 2020-01-07 ENCOUNTER — Encounter: Payer: Self-pay | Admitting: Family Medicine

## 2020-01-07 ENCOUNTER — Other Ambulatory Visit: Payer: Self-pay

## 2020-01-07 ENCOUNTER — Ambulatory Visit (INDEPENDENT_AMBULATORY_CARE_PROVIDER_SITE_OTHER): Payer: BC Managed Care – PPO | Admitting: Family Medicine

## 2020-01-07 ENCOUNTER — Ambulatory Visit: Payer: BC Managed Care – PPO | Admitting: Psychiatry

## 2020-01-07 VITALS — BP 122/80 | HR 81 | Ht 63.0 in | Wt 129.0 lb

## 2020-01-07 DIAGNOSIS — M25561 Pain in right knee: Secondary | ICD-10-CM | POA: Diagnosis not present

## 2020-01-07 DIAGNOSIS — M999 Biomechanical lesion, unspecified: Secondary | ICD-10-CM | POA: Diagnosis not present

## 2020-01-07 DIAGNOSIS — M5416 Radiculopathy, lumbar region: Secondary | ICD-10-CM | POA: Diagnosis not present

## 2020-01-07 DIAGNOSIS — R768 Other specified abnormal immunological findings in serum: Secondary | ICD-10-CM | POA: Diagnosis not present

## 2020-01-07 NOTE — Patient Instructions (Addendum)
Good to see you Have a great holiday  Lets give the knee a little more time Did some mild manipulation  Stay with Dr. Noel Gerold about the back and the hip  Arnica lotion to the knee  See me again in 8 weeks

## 2020-01-07 NOTE — Progress Notes (Signed)
[name] [address] [place] [address] Phone: [phone] Subjective:   I Terri [name] am serving as a Neurosurgeon for Dr. Antoine Primas.  This visit occurred during the SARS-CoV-2 public health emergency.  Safety protocols were in place, including screening questions prior to the visit, additional usage of staff PPE, and extensive cleaning of exam room while observing appropriate contact time as indicated for disinfecting solutions.   I'm seeing this patient by the request  of:  Panosh, [name], MD  CC: Knee pain follow-up, back pain and neck pain follow-up  [id number]   11/24/2019 Patient does have an area that seems to be more of a bone contusion and seems to be on the anterior lateral aspect of the tibia.  Patient will have x-rays to rule out any type of cortical irregularity but I do think patient has full range of motion good stability of the knee.  Likely still more of a bone contusion.  Discussed topical anti-inflammatories icing regimen, follow-up again in 6 to 8 weeks x-rays pending  Update 01/07/2020 Idara Woodside is a 54 y.o. female coming in with complaint of back and neck pain. Right knee pain. Patient states she feels exactly the same. Hasn't gotten better. Pain with walking down stairs in the knee and kneeling.  Otherwise patient is not having any discomfort of the knee.  Patient continues to have some neck and back pain.  Seems to be more chronic.  Patient is seeing another provider about the back pain at this time and he is scheduled for an MRI of the back and hip.  Patient states that they are going to try to do an MRI lumbar with her standing to further evaluate if there is any nerve impingement that is contributing.  Medications patient has been prescribed: Gabapentin  Taking: Yes    Regarding patient's knee patient had x-rays and ultrasound at last exam that were unremarkable.  These were independently visualized by me  today.  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   As well as notes that were available from care everywhere and other healthcare systems.  Past medical history, social, surgical and family history all reviewed in electronic medical record.  No pertanent information unless stated regarding to the chief complaint.   Past Medical History:  Diagnosis Date  . Anxiety   . Depression   . Environmental allergies   . GERD (gastroesophageal reflux disease)   . Headache(784.0)   . Recurrent sinusitis    ent and pulm eval in past.    Allergies  Allergen Reactions  . Augmentin [Amoxicillin-Pot Clavulanate] Nausea Only  . Celecoxib     REACTION: unspecified  . Sulfa Antibiotics   . Sulfonamide Derivatives     REACTION: hives, throat swelling  . Monistat [Miconazole] Other (See Comments)    Sensitive to topical vaginal  Azoles No side effects with oral Diflucan     Review of Systems:  No headache, visual changes, nausea, vomiting, diarrhea, constipation, dizziness, abdominal pain, skin rash, fevers, chills, night sweats, weight loss, swollen lymph nodes, body aches, joint swelling, chest pain, shortness of breath, mood changes. POSITIVE muscle aches  Objective  Blood pressure 122/80, pulse 81, height 5\' 3"  (1.6 m), weight 129 lb (58.5 kg), last menstrual period  07/19/2017, SpO2 98 %.   General: No apparent distress alert and oriented x3 mood and affect normal, dressed appropriately.  HEENT: Pupils equal, extraocular movements intact  Respiratory: Patient's speak in full sentences and does not appear short of breath  Cardiovascular: No lower extremity edema, non tender, no erythema  Gait mild antalgic MSK:  Non tender with full  range of motion and good stability and symmetric strength and tone of shoulders, elbows, wrist, hip and ankles bilaterally.  Significant hypermobility noted Back -low back exam does have loss of lordosis.  Some tenderness to palpation in the paraspinal musculature of the lumbar spine right greater than left.  More around the right sacroiliac joint.  Patient does have tightness with straight leg test but no true radicular symptoms.  Patient's left hip still has some very mild decrease in internal range of motion which has been patient's chronic problem.   Right knee exam shows the patient has full range of motion.  Nontender on exam today.  Mild patellar grind test noted.  Otherwise unremarkable.   Osteopathic findings   T3 extended rotated and side bent right inhaled rib T9 extended rotated and side bent left L2 flexed rotated and side bent right Sacrum right on right Pelvic shear right      Assessment and Plan:  Right anterior knee pain Patient has made improvement.  Still has pain just with the pressure.  Patient's x-rays were unremarkable.  Patient's previous ultrasound did show maybe a cortical irregularity noted of the patella but nothing seems to be giving her significant trouble at this time.  We discussed different treatment options including formal physical therapy or advanced imaging.  Patient declined both with her making some improvement.  Patient will just avoid direct impact on the knee for now follow-up with me again 6 to 8 weeks  Positive ANA (antinuclear antibody) Once again discussed with patient along following up with a rheumatologist.  Strong family history as well.  Lumbar radicular pain Patient is responding fairly well to osteopathic manipulation.  Due to the radicular symptoms and seeing another provider focus more on muscle energy today.  Continue the gabapentin.  Patient will have her further work-up by the other physician we can change accordingly.    Nonallopathic problems  Decision today to treat with OMT was based on Physical Exam  After verbal consent patient was treated with  ME, FPR techniques in  rib, thoracic, lumbar, and sacral  areas  Patient tolerated the procedure well with improvement in symptoms  Patient given exercises, stretches and lifestyle modifications  See medications in patient instructions if given  Patient will follow up in 4-8 weeks      The above documentation has been reviewed and is accurate and complete Judi Saa, DO       Note: This dictation was prepared with Dragon dictation along with smaller phrase technology. Any transcriptional errors that result from this process are unintentional.

## 2020-01-08 ENCOUNTER — Encounter: Payer: Self-pay | Admitting: Family Medicine

## 2020-01-08 NOTE — Assessment & Plan Note (Signed)
Once again discussed with patient along following up with a rheumatologist.  Strong family history as well.

## 2020-01-08 NOTE — Assessment & Plan Note (Signed)
Patient has made improvement.  Still has pain just with the pressure.  Patient's x-rays were unremarkable.  Patient's previous ultrasound did show maybe a cortical irregularity noted of the patella but nothing seems to be giving her significant trouble at this time.  We discussed different treatment options including formal physical therapy or advanced imaging.  Patient declined both with her making some improvement.  Patient will just avoid direct impact on the knee for now follow-up with me again 6 to 8 weeks

## 2020-01-08 NOTE — Assessment & Plan Note (Signed)
Patient is responding fairly well to osteopathic manipulation.  Due to the radicular symptoms and seeing another provider focus more on muscle energy today.  Continue the gabapentin.  Patient will have her further work-up by the other physician we can change accordingly.

## 2020-01-09 DIAGNOSIS — M542 Cervicalgia: Secondary | ICD-10-CM | POA: Diagnosis not present

## 2020-01-09 DIAGNOSIS — Z20822 Contact with and (suspected) exposure to covid-19: Secondary | ICD-10-CM | POA: Diagnosis not present

## 2020-01-09 DIAGNOSIS — M6281 Muscle weakness (generalized): Secondary | ICD-10-CM | POA: Diagnosis not present

## 2020-01-09 DIAGNOSIS — R2689 Other abnormalities of gait and mobility: Secondary | ICD-10-CM | POA: Diagnosis not present

## 2020-01-09 DIAGNOSIS — G8929 Other chronic pain: Secondary | ICD-10-CM | POA: Diagnosis not present

## 2020-01-13 NOTE — Telephone Encounter (Signed)
Ok to add on  Labs as per obgyne  Please place order for her to add on

## 2020-01-13 NOTE — Telephone Encounter (Signed)
I spoke with pt. She is aware that lab orders have been added & she will have labs done at the [facility] lab.

## 2020-01-15 DIAGNOSIS — S161XXA Strain of muscle, fascia and tendon at neck level, initial encounter: Secondary | ICD-10-CM | POA: Diagnosis not present

## 2020-01-15 DIAGNOSIS — M6281 Muscle weakness (generalized): Secondary | ICD-10-CM | POA: Diagnosis not present

## 2020-01-15 DIAGNOSIS — M542 Cervicalgia: Secondary | ICD-10-CM | POA: Diagnosis not present

## 2020-01-15 DIAGNOSIS — R2689 Other abnormalities of gait and mobility: Secondary | ICD-10-CM | POA: Diagnosis not present

## 2020-01-15 DIAGNOSIS — H9202 Otalgia, left ear: Secondary | ICD-10-CM | POA: Diagnosis not present

## 2020-01-15 DIAGNOSIS — Z20822 Contact with and (suspected) exposure to covid-19: Secondary | ICD-10-CM | POA: Diagnosis not present

## 2020-01-15 DIAGNOSIS — G8929 Other chronic pain: Secondary | ICD-10-CM | POA: Diagnosis not present

## 2020-02-02 DIAGNOSIS — M9903 Segmental and somatic dysfunction of lumbar region: Secondary | ICD-10-CM | POA: Diagnosis not present

## 2020-02-02 DIAGNOSIS — M5386 Other specified dorsopathies, lumbar region: Secondary | ICD-10-CM | POA: Diagnosis not present

## 2020-02-02 DIAGNOSIS — M9908 Segmental and somatic dysfunction of rib cage: Secondary | ICD-10-CM | POA: Diagnosis not present

## 2020-02-02 DIAGNOSIS — M9902 Segmental and somatic dysfunction of thoracic region: Secondary | ICD-10-CM | POA: Diagnosis not present

## 2020-02-03 ENCOUNTER — Other Ambulatory Visit: Payer: Self-pay

## 2020-02-03 ENCOUNTER — Ambulatory Visit (INDEPENDENT_AMBULATORY_CARE_PROVIDER_SITE_OTHER): Payer: BC Managed Care – PPO | Admitting: Psychiatry

## 2020-02-03 ENCOUNTER — Encounter: Payer: Self-pay | Admitting: Psychiatry

## 2020-02-03 DIAGNOSIS — F411 Generalized anxiety disorder: Secondary | ICD-10-CM

## 2020-02-03 NOTE — Progress Notes (Signed)
      [facility] Counselor/Therapist Progress Note  Patient ID: Terri [name], MRN: [id number],    Date: 02/03/2020  Time Spent: 50 minutes   Treatment Type: Individual Therapy  Reported Symptoms: anxiety  Mental Status Exam:  Appearance:   Casual     Behavior:  Appropriate  Motor:  Normal  Speech/Language:   Clear and Coherent  Affect:  Appropriate  Mood:  anxious  Thought process:  normal  Thought content:    WNL  Sensory/Perceptual disturbances:    WNL  Orientation:  oriented to person, place, time/date and situation  Attention:  Good  Concentration:  Good  Memory:  WNL  Fund of knowledge:   Good  Insight:    Good  Judgment:   Good  Impulse Control:  Good   Risk Assessment: Danger to Self:  No Self-injurious Behavior: No Danger to Others: No Duty to Warn:no Physical Aggression / Violence:No  Access to Firearms a concern: No  Gang Involvement:No   Subjective: The client states that Christmas was hard this year.  Her 9 year old mother fell unconscious before she went to bed.  She was on the floor from 10 PM until 3:30 PM the next day.  Her mother did not have a stroke but possibly her blood pressure is too low.  The client is very concerned about her mother's health.  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  The client and her boyfriend were leaving for [place], [place] for vacation.  The client states she was able to go which was a relief. The client states she continues to have conflict with her boyfriend.  She feels it mainly results from his insecurity that he is not enough.  The client has tried to explain this to her boyfriend to no avail.  They continue to see the marriage counselor together.  She continues to work on boundaries.  Today we used the bilateral stimulation hand battles to reduce her anxiety from a subjective means of distress 5 to less than 2  Interventions:  Assertiveness/Communication, Motivational Interviewing, Solution-Oriented/Positive Psychology, [employer] Desensitization and Reprocessing (EMDR) and Insight-Oriented  Diagnosis:   ICD-10-CM   1. Generalized anxiety disorder  F41.1     Plan: Mood and dependent behavior, thinking errors, positive self talk, marriage counseling, self-care, assertiveness, boundaries.  Terri [name], [facility]

## 2020-02-16 DIAGNOSIS — M9908 Segmental and somatic dysfunction of rib cage: Secondary | ICD-10-CM | POA: Diagnosis not present

## 2020-02-16 DIAGNOSIS — M5386 Other specified dorsopathies, lumbar region: Secondary | ICD-10-CM | POA: Diagnosis not present

## 2020-02-16 DIAGNOSIS — M9902 Segmental and somatic dysfunction of thoracic region: Secondary | ICD-10-CM | POA: Diagnosis not present

## 2020-02-16 DIAGNOSIS — M9903 Segmental and somatic dysfunction of lumbar region: Secondary | ICD-10-CM | POA: Diagnosis not present

## 2020-02-18 DIAGNOSIS — M9902 Segmental and somatic dysfunction of thoracic region: Secondary | ICD-10-CM | POA: Diagnosis not present

## 2020-02-18 DIAGNOSIS — M5386 Other specified dorsopathies, lumbar region: Secondary | ICD-10-CM | POA: Diagnosis not present

## 2020-02-18 DIAGNOSIS — M9903 Segmental and somatic dysfunction of lumbar region: Secondary | ICD-10-CM | POA: Diagnosis not present

## 2020-02-18 DIAGNOSIS — M9908 Segmental and somatic dysfunction of rib cage: Secondary | ICD-10-CM | POA: Diagnosis not present

## 2020-02-25 DIAGNOSIS — M5386 Other specified dorsopathies, lumbar region: Secondary | ICD-10-CM | POA: Diagnosis not present

## 2020-02-25 DIAGNOSIS — M9903 Segmental and somatic dysfunction of lumbar region: Secondary | ICD-10-CM | POA: Diagnosis not present

## 2020-02-25 DIAGNOSIS — M9908 Segmental and somatic dysfunction of rib cage: Secondary | ICD-10-CM | POA: Diagnosis not present

## 2020-02-25 DIAGNOSIS — M9902 Segmental and somatic dysfunction of thoracic region: Secondary | ICD-10-CM | POA: Diagnosis not present

## 2020-03-02 ENCOUNTER — Ambulatory Visit: Payer: BC Managed Care – PPO | Admitting: Psychiatry

## 2020-03-02 NOTE — Progress Notes (Unsigned)
[name] [address] [place] [address] Phone: [phone] Subjective:   I [name] am serving as a Neurosurgeon for Dr. Antoine Primas.  This visit occurred during the SARS-CoV-2 public health emergency.  Safety protocols were in place, including screening questions prior to the visit, additional usage of staff PPE, and extensive cleaning of exam room while observing appropriate contact time as indicated for disinfecting solutions.   I'm seeing this patient by the request  of:  Panosh, [name], MD  CC: Back pain, neck pain, headaches and knee pain  [id number]  Terri [name] is a 56 y.o. female coming in with complaint of back and neck pain. OMT 01/07/2020. Patient states her neck is killing her today and she is not sure why. Pain is giving her a headache.  Denies of any dizziness or any lightheadedness.  Patient states though if she does not move sometimes too quickly has an audible pop that can cause significant pain in the neck.  Knee is getting better. Still can tell that it is sore with kneeling.   Medications patient has been prescribed: None     X-rays of the right knee taken in November 2021 were unremarkable.  X-rays of the neck has shown diffuse moderate to severe degenerative changes of the cervical spine.   Reviewed prior external information including notes and imaging from previsou exam, outside providers and external EMR if available.   As well as notes that were available from care everywhere and other healthcare systems.  Past medical history, social, surgical and family history all reviewed in electronic medical record.  No pertanent information unless stated regarding to the chief complaint.   Past Medical History:  Diagnosis Date  . Anxiety   . Depression   . Environmental allergies   . GERD (gastroesophageal reflux disease)   . Headache(784.0)   . Recurrent sinusitis    ent and pulm eval in past.    Allergies   Allergen Reactions  . Augmentin [Amoxicillin-Pot Clavulanate] Nausea Only  . Celecoxib     REACTION: unspecified  . Sulfa Antibiotics   . Sulfonamide Derivatives     REACTION: hives, throat swelling  . Monistat [Miconazole] Other (See Comments)    Sensitive to topical vaginal  Azoles No side effects with oral Diflucan     Review of Systems:  No headache, visual changes, nausea, vomiting, diarrhea, constipation, dizziness, abdominal pain, skin rash, fevers, chills, night sweats, weight loss, swollen lymph nodes,, joint swelling, chest pain, shortness of breath, mood changes. POSITIVE muscle aches, body aches  Objective  Blood pressure 110/70, pulse 97, height 5\' 3"  (1.6 m), weight 126 lb (57.2 kg), last menstrual period 07/19/2017, SpO2 98 %.   General: No apparent distress alert and oriented x3 mood and affect normal, dressed appropriately.  HEENT: Pupils equal, extraocular movements intact  Respiratory: Patient's speak in full sentences and does not appear short of breath  Cardiovascular: No lower extremity edema, non tender, no erythema   Gait normal with good balance and coordination.  MSK:  Non tender with full range of motion and good stability and symmetric strength and tone of shoulders, elbows, wrist, hip, knee and ankles bilaterally.  Neck exam does have loss of lordosis.  Degenerative changes noted with mild crepitus.  Patient states that the pain seems to be significantly worse with anything more than 10 degrees of extension.  Mild radicular symptoms into the right shoulder on the right side with Spurling's.  5 out  of 5 strength of the upper extremities.  Osteopathic findings  C2 flexed rotated and side bent right C6 flexed rotated and side bent left T3 extended rotated and side bent right inhaled rib T9 extended rotated and side bent left L2 flexed rotated and side bent right L4 flexed rotated and side bent left Sacrum right on right       Assessment and  Plan:  Right anterior knee pain Patient's x-rays were unremarkable.  Patient did have an area that seemed to be more of a hematoma previously on ultrasound.  Continuing to have pain and we discussed the potential for advanced imaging.  Patient though would like to hold off on this with her having more above work-up of her lower back by another provider.  Follow-up with me again if any worsening pain or if she wants to change management.  Degenerative disc disease, cervical Patient does have a degenerative disc disease of the cervical spine that is moderate to severe especially for patient's age.  We discussed with patient about advanced imaging.  Patient would like to hold at this time.  I did discuss with her with patient also having a positive ANA and potentially a vasculitis is within the differential and should consider a MRI of the cervical as well as an MRI cervical angiogram if we decide to do it.  We discussed if patient did get anything such as dizziness or lightheadedness when patient sees her chiropractor we do need to consider possibly an MR angiogram.  Patient no feels like this is not something that she has on a regular basis and makes it is more just secondary to the tightness in her neck at this time.  Wants to hold on advanced imaging.  Knows to seek medical attention if worsening.  Follow-up with me again in 4 to 8 weeks  Positive ANA (antinuclear antibody) Patient did have a positive ANA.  We recommended that patient sees rheumatology multiple times.  Patient has been noncompliant.    Nonallopathic problems  Decision today to treat with OMT was based on Physical Exam  After verbal consent patient was treated with , ME, FPR techniques in cervical, rib, thoracic, lumbar, and sacral  areas avoided HVLA from midline secondary to her seen multiple different providers and some mild noncompliance  Patient tolerated the procedure well with improvement in symptoms  Patient given  exercises, stretches and lifestyle modifications  See medications in patient instructions if given  Patient will follow up in 4-8 weeks      The above documentation has been reviewed and is accurate and complete Judi Saa, DO       Note: This dictation was prepared with Dragon dictation along with smaller phrase technology. Any transcriptional errors that result from this process are unintentional.

## 2020-03-03 ENCOUNTER — Encounter: Payer: Self-pay | Admitting: Family Medicine

## 2020-03-03 ENCOUNTER — Other Ambulatory Visit: Payer: Self-pay

## 2020-03-03 ENCOUNTER — Ambulatory Visit: Payer: BC Managed Care – PPO | Admitting: Family Medicine

## 2020-03-03 VITALS — BP 110/70 | HR 97 | Ht 63.0 in | Wt 126.0 lb

## 2020-03-03 DIAGNOSIS — M25561 Pain in right knee: Secondary | ICD-10-CM

## 2020-03-03 DIAGNOSIS — M999 Biomechanical lesion, unspecified: Secondary | ICD-10-CM | POA: Diagnosis not present

## 2020-03-03 DIAGNOSIS — R768 Other specified abnormal immunological findings in serum: Secondary | ICD-10-CM | POA: Diagnosis not present

## 2020-03-03 DIAGNOSIS — M5030 Other cervical disc degeneration, unspecified cervical region: Secondary | ICD-10-CM | POA: Diagnosis not present

## 2020-03-03 NOTE — Patient Instructions (Signed)
Good to see you Consider MRI See me again in 2 months

## 2020-03-03 NOTE — Assessment & Plan Note (Signed)
Patient's x-rays were unremarkable.  Patient did have an area that seemed to be more of a hematoma previously on ultrasound.  Continuing to have pain and we discussed the potential for advanced imaging.  Patient though would like to hold off on this with her having more above work-up of her lower back by another provider.  Follow-up with me again if any worsening pain or if she wants to change management.

## 2020-03-03 NOTE — Assessment & Plan Note (Addendum)
Patient does have a degenerative disc disease of the cervical spine that is moderate to severe especially for patient's age.  We discussed with patient about advanced imaging.  Patient would like to hold at this time.  I did discuss with her with patient also having a positive ANA and potentially a vasculitis is within the differential and should consider a MRI of the cervical as well as an MRI cervical angiogram if we decide to do it.  We discussed if patient did get anything such as dizziness or lightheadedness when patient sees her chiropractor we do need to consider possibly an MR angiogram.  Patient no feels like this is not something that she has on a regular basis and makes it is more just secondary to the tightness in her neck at this time.  Wants to hold on advanced imaging.  Knows to seek medical attention if worsening.  Follow-up with me again in 4 to 8 weeks

## 2020-03-03 NOTE — Assessment & Plan Note (Signed)
Patient did have a positive ANA.  We recommended that patient sees rheumatology multiple times.  Patient has been noncompliant.

## 2020-03-08 ENCOUNTER — Other Ambulatory Visit (INDEPENDENT_AMBULATORY_CARE_PROVIDER_SITE_OTHER): Payer: BC Managed Care – PPO

## 2020-03-08 DIAGNOSIS — F909 Attention-deficit hyperactivity disorder, unspecified type: Secondary | ICD-10-CM

## 2020-03-08 DIAGNOSIS — R040 Epistaxis: Secondary | ICD-10-CM

## 2020-03-08 DIAGNOSIS — Z0000 Encounter for general adult medical examination without abnormal findings: Secondary | ICD-10-CM

## 2020-03-08 DIAGNOSIS — Z780 Asymptomatic menopausal state: Secondary | ICD-10-CM | POA: Diagnosis not present

## 2020-03-08 DIAGNOSIS — F32A Depression, unspecified: Secondary | ICD-10-CM

## 2020-03-08 DIAGNOSIS — F419 Anxiety disorder, unspecified: Secondary | ICD-10-CM

## 2020-03-08 DIAGNOSIS — Z79899 Other long term (current) drug therapy: Secondary | ICD-10-CM | POA: Diagnosis not present

## 2020-03-08 LAB — CBC WITH DIFFERENTIAL/PLATELET
Basophils Absolute: 0 10*3/uL (ref 0.0–0.1)
Basophils Relative: 0.6 % (ref 0.0–3.0)
Eosinophils Absolute: 0.3 10*3/uL (ref 0.0–0.7)
Eosinophils Relative: 5.3 % — ABNORMAL HIGH (ref 0.0–5.0)
HCT: 40.6 % (ref 36.0–46.0)
Hemoglobin: 13.7 g/dL (ref 12.0–15.0)
Lymphocytes Relative: 24.8 % (ref 12.0–46.0)
Lymphs Abs: 1.5 10*3/uL (ref 0.7–4.0)
MCHC: 33.8 g/dL (ref 30.0–36.0)
MCV: 99.9 fl (ref 78.0–100.0)
Monocytes Absolute: 0.7 10*3/uL (ref 0.1–1.0)
Monocytes Relative: 11.5 % (ref 3.0–12.0)
Neutro Abs: 3.5 10*3/uL (ref 1.4–7.7)
Neutrophils Relative %: 57.8 % (ref 43.0–77.0)
Platelets: 249 10*3/uL (ref 150.0–400.0)
RBC: 4.06 Mil/uL (ref 3.87–5.11)
RDW: 13 % (ref 11.5–15.5)
WBC: 6.1 10*3/uL (ref 4.0–10.5)

## 2020-03-08 LAB — BASIC METABOLIC PANEL
BUN: 9 mg/dL (ref 6–23)
CO2: 28 mEq/L (ref 19–32)
Calcium: 9.4 mg/dL (ref 8.4–10.5)
Chloride: 102 mEq/L (ref 96–112)
Creatinine, Ser: 0.8 mg/dL (ref 0.40–1.20)
GFR: 82.5 mL/min (ref >60.00)
Glucose, Bld: 92 mg/dL (ref 70–99)
Potassium: 3.9 mEq/L (ref 3.5–5.1)
Sodium: 137 mEq/L (ref 135–145)

## 2020-03-08 LAB — LIPID PANEL
Cholesterol: 219 mg/dL — ABNORMAL HIGH (ref 0–200)
HDL: 103.3 mg/dL (ref >39.00)
LDL Cholesterol: 102 mg/dL — ABNORMAL HIGH (ref 0–99)
NonHDL: 115.69
Total CHOL/HDL Ratio: 2
Triglycerides: 66 mg/dL (ref 0.0–149.0)
VLDL: 13.2 mg/dL (ref 0.0–40.0)

## 2020-03-08 LAB — HEPATIC FUNCTION PANEL
ALT: 37 U/L — ABNORMAL HIGH (ref 0–35)
AST: 27 U/L (ref 0–37)
Albumin: 4.1 g/dL (ref 3.5–5.2)
Alkaline Phosphatase: 61 U/L (ref 39–117)
Bilirubin, Direct: 0.1 mg/dL (ref 0.0–0.3)
Total Bilirubin: 0.5 mg/dL (ref 0.2–1.2)
Total Protein: 7.5 g/dL (ref 6.0–8.3)

## 2020-03-08 LAB — T4, FREE: Free T4: 0.88 ng/dL (ref 0.60–1.60)

## 2020-03-08 LAB — TSH: TSH: 1.09 u[IU]/mL (ref 0.35–4.50)

## 2020-03-09 ENCOUNTER — Telehealth: Payer: Self-pay | Admitting: Internal Medicine

## 2020-03-09 LAB — FOLLICLE STIMULATING HORMONE: FSH: 74.7 m[IU]/mL

## 2020-03-09 LAB — ESTRADIOL: Estradiol: 23 pg/mL

## 2020-03-09 NOTE — Telephone Encounter (Signed)
Pt is calling in needing a refill on Rx methylphenidate (CONCERTA) 36 MG  [facility] [address]  Pt has only 2 on hand and will be going out of town on Thursday and would like to see if it can be filled today she is aware of the 24-72 hr protocol for refills.

## 2020-03-10 NOTE — Telephone Encounter (Signed)
Last office visit- 07/18/2019 Last refill--11/05/2019  No future appointment schedule

## 2020-03-11 ENCOUNTER — Telehealth: Payer: Self-pay | Admitting: Internal Medicine

## 2020-03-11 NOTE — Telephone Encounter (Signed)
Last refill--11/05/2019 Last office visit-07/18/19  No future appointment scheduled

## 2020-03-11 NOTE — Telephone Encounter (Signed)
Pt is calling in to check the status of the below msg and stated that she will be going out of town in the next 4 hours and would like to see if it can be called in before then b/c she does not have any. [facility] [address]

## 2020-03-11 NOTE — Telephone Encounter (Signed)
Patient is requesting a refill on her methylphenidate.  Pharmacy: [facility] [address]   Please advise.

## 2020-03-13 MED ORDER — METHYLPHENIDATE HCL ER (OSM) 36 MG PO TBCR: 36.0000 mg | EXTENDED_RELEASE_TABLET | Freq: Every day | ORAL | 0 refills | Status: DC

## 2020-03-13 NOTE — Telephone Encounter (Signed)
So she is due for a med check appointment  Make her a med check appointment virtual is okay.  (Of note in future you can make the appointment before sending me the request for refill if she is due which is at least every 6 months.[name]) We will refill this time. No more until  Visit med check

## 2020-03-13 NOTE — Progress Notes (Signed)
Postmenopausal hormone levels a minor liver test abnormality that may be clinically insignificant could be from medication.  Cholesterol and good ratio.   Advised follow-up visit med check anyway see previous notes about refills.

## 2020-03-15 NOTE — Telephone Encounter (Signed)
See other encounter.

## 2020-03-15 NOTE — Telephone Encounter (Signed)
Appt scheduled for VV on 03/24/20 at 4pm.

## 2020-03-16 ENCOUNTER — Other Ambulatory Visit: Payer: Self-pay

## 2020-03-16 ENCOUNTER — Ambulatory Visit (INDEPENDENT_AMBULATORY_CARE_PROVIDER_SITE_OTHER): Payer: BC Managed Care – PPO | Admitting: Psychiatry

## 2020-03-16 ENCOUNTER — Encounter: Payer: Self-pay | Admitting: Psychiatry

## 2020-03-16 DIAGNOSIS — F411 Generalized anxiety disorder: Secondary | ICD-10-CM | POA: Diagnosis not present
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
## 2020-03-24 ENCOUNTER — Encounter: Payer: Self-pay | Admitting: Internal Medicine

## 2020-03-24 ENCOUNTER — Telehealth (INDEPENDENT_AMBULATORY_CARE_PROVIDER_SITE_OTHER): Payer: BC Managed Care – PPO | Admitting: Internal Medicine

## 2020-03-24 DIAGNOSIS — F419 Anxiety disorder, unspecified: Secondary | ICD-10-CM

## 2020-03-24 DIAGNOSIS — F909 Attention-deficit hyperactivity disorder, unspecified type: Secondary | ICD-10-CM

## 2020-03-24 DIAGNOSIS — B001 Herpesviral vesicular dermatitis: Secondary | ICD-10-CM

## 2020-03-24 DIAGNOSIS — F32A Depression, unspecified: Secondary | ICD-10-CM

## 2020-03-24 DIAGNOSIS — R768 Other specified abnormal immunological findings in serum: Secondary | ICD-10-CM

## 2020-03-24 DIAGNOSIS — Z79899 Other long term (current) drug therapy: Secondary | ICD-10-CM | POA: Diagnosis not present

## 2020-03-24 DIAGNOSIS — R7689 Other specified abnormal immunological findings in serum: Secondary | ICD-10-CM

## 2020-03-24 MED ORDER — BUPROPION HCL ER (XL) 300 MG PO TB24: ORAL_TABLET | ORAL | 1 refills | Status: DC

## 2020-03-24 MED ORDER — VALACYCLOVIR HCL 1 G PO TABS: 1000.0000 mg | ORAL_TABLET | Freq: Two times a day (BID) | ORAL | 1 refills | Status: AC

## 2020-03-24 NOTE — Progress Notes (Signed)
Virtual Visit via Video Note  I connected with@ on 03/24/20 at  4:00 PM EST by a video enabled telemedicine application and verified that I am speaking with the correct person using two identifiers. Location patient: home Location provider:work  office Persons participating in the virtual visit: patient, provider  WIth national recommendations  regarding COVID 19 pandemic   video visit is advised over in office visit for this patient.  Patient aware  of the limitations of evaluation and management by telemedicine and  availability of in person appointments. and agreed to proceed. Technical difficulties logging in is in adequate contacted by Doximity.phone app   HPI: Terri [name] presents for video visit  Med check    ADHD medication : Taking daily seems to work well when ran out for a week she was foggy and noticed a big difference.  Mood depression anxiety bupropion 300 works quite well would like to stay on that.  Musculoskeletal sees Dr. Katrinka Blazing occasionally never got to the rheumatologist after positive ANA labs in 2017 mother was very sick. She is coping well now did have recent labs to review.  Is getting a cold sore and just ran out of her valacyclovir could just be refilled  She has been taking spironolactone low-dose 10 mg from dermatologist Dr. Wallace Cullens at [facility] and had a past history of a secondary MRSA infection scared to go off asks about refill we have never refilled this medicine.   ROS: See pertinent positives and negatives per HPI.  Past Medical History:  Diagnosis Date  . Anxiety   . Depression   . Environmental allergies   . GERD (gastroesophageal reflux disease)   . Headache(784.0)   . Recurrent sinusitis    ent and pulm eval in past.    Past Surgical History:  Procedure Laterality Date  . HIP ARTHROSCOPY W/ LABRAL DEBRIDEMENT    . left hip surgery     [facility]    Family History  Problem Relation Age of Onset  . Heart disease Other        fhx    Social  History   Tobacco Use  . Smoking status: Never Smoker  . Smokeless tobacco: Never Used  Vaping Use  . Vaping Use: Never used  Substance Use Topics  . Alcohol use: Yes    Comment: Occasional use  . Drug use: No      Current Outpatient Medications:  .  ALPRAZolam (XANAX) 0.25 MG tablet, Take 1 tablet (0.25 mg total) by mouth 3 (three) times daily as needed for anxiety (panic)., Disp: 20 tablet, Rfl: 0 .  buPROPion (WELLBUTRIN XL) 300 MG 24 hr tablet, TAKE 1 TABLET BY MOUTH EVERY DAY, Disp: 90 tablet, Rfl: 1 .  fish oil-omega-3 fatty acids 1000 MG capsule, Take 1 g by mouth daily., Disp: , Rfl:  .  fluconazole (DIFLUCAN) 150 MG tablet, 1 pill today then repeat in 4 days, Disp: 2 tablet, Rfl: 0 .  fluticasone (FLONASE) 50 MCG/ACT nasal spray, SPRAY 2 SPRAYS INTO EACH NOSTRIL EVERY DAY, Disp: 16 g, Rfl: 11 .  gabapentin (NEURONTIN) 100 MG capsule, TAKE 2 CAPSULES BY MOUTH EVERY DAY AT BEDTIME, Disp: 60 capsule, Rfl: 3 .  Hydrocortisone Butyrate 0.1 % OINT, hydrocortisone butyrate 0.1 % topical ointment, Disp: , Rfl:  .  Ivermectin (SOOLANTRA) 1 % CREA, Soolantra 1 % topical cream, Disp: , Rfl:  .  LYSINE PO, Take 1,000 mg by mouth daily. for fever blisters., Disp: , Rfl:  .  meloxicam (MOBIC)  15 MG tablet, as needed. , Disp: , Rfl:  .  methylphenidate (CONCERTA) 36 MG PO CR tablet, Take 1 tablet (36 mg total) by mouth daily. Generic Need appointment before further refills 30 given until gets appt., Disp: 30 tablet, Rfl: 0 .  Norethin-Eth Estrad-Fe Biphas (LO LOESTRIN FE PO), Take by mouth., Disp: , Rfl:  .  Nutritional Supplements (DHEA PO), Take by mouth. , Disp: , Rfl:  .  spironolactone (ALDACTONE) 25 MG tablet, Take 25 mg by mouth daily., Disp: , Rfl:  .  SUMAtriptan (IMITREX) 100 MG tablet, Take 1 at onset of  Migraine headache   and can repeat in 2 hours ., Disp: 9 tablet, Rfl: 1 .  valACYclovir (VALTREX) 1000 MG tablet, Take 1 tablet (1,000 mg total) by mouth 2 (two) times daily. As  direted, Disp: 180 tablet, Rfl: 1 .  Vitamin D, Ergocalciferol, (DRISDOL) 50000 units CAPS capsule, TAKE 1 CAPSULE EVERY 7 DAYS, Disp: 12 capsule, Rfl: 0  EXAM: BP Readings from Last 3 Encounters:  03/03/20 110/70  01/07/20 122/80  11/24/19 128/80    VITALS per patient if applicable:  GENERAL: alert, oriented, appears well and in no acute distress  PSYCH/NEURO: pleasant and cooperative, no obvious depression or anxiety, speech and thought processing grossly intact Lab Results  Component Value Date   WBC 6.1 03/08/2020   HGB 13.7 03/08/2020   HCT 40.6 03/08/2020   PLT 249.0 03/08/2020   GLUCOSE 92 03/08/2020   CHOL 219 (H) 03/08/2020   TRIG 66.0 03/08/2020   HDL 103.30 03/08/2020   LDLCALC 102 (H) 03/08/2020   ALT 37 (H) 03/08/2020   AST 27 03/08/2020   NA 137 03/08/2020   K 3.9 03/08/2020   CL 102 03/08/2020   CREATININE 0.80 03/08/2020   BUN 9 03/08/2020   CO2 28 03/08/2020   TSH 1.09 03/08/2020  Lab reviewed with patient  ASSESSMENT AND PLAN:  Discussed the following assessment and plan:    ICD-10-CM   1. Attention deficit hyperactivity disorder (ADHD), unspecified ADHD type  F90.9   2. Medication management  Z79.899   3. Anxiety and depression  F41.9    F32.A   4. 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 Positive ANA (antinuclear antibody)  R76.8     Counseled.  Can contact pharmacy to see if they will accept prescriptions early on controlled substances.  And hold them. At some point advise get with rheumatology as we reviewed her old labs. Potassium is in range contact her dermatology team about refills.  I will try to send a copy of the labs to Dr. Wallace Cullens at [place]. We will refill Valtrex as she has been on this in the past. Plan in person visit in about 4 months. Continue Wellbutrin benefit more than risk.   Expectant management and discussion of plan and treatment with opportunity to ask questions and all were answered. The patient agreed with the plan and  demonstrated an understanding of the instructions.   Advised to call back or seek an in-person evaluation if worsening  or having  further concerns . No follow-ups on file. I provided of non-face-to-face time during this encounter.   Berniece Andreas, MD

## 2020-03-29 DIAGNOSIS — M5386 Other specified dorsopathies, lumbar region: Secondary | ICD-10-CM | POA: Diagnosis not present

## 2020-03-29 DIAGNOSIS — M9902 Segmental and somatic dysfunction of thoracic region: Secondary | ICD-10-CM | POA: Diagnosis not present

## 2020-03-29 DIAGNOSIS — M9903 Segmental and somatic dysfunction of lumbar region: Secondary | ICD-10-CM | POA: Diagnosis not present

## 2020-03-29 DIAGNOSIS — M9908 Segmental and somatic dysfunction of rib cage: Secondary | ICD-10-CM | POA: Diagnosis not present

## 2020-04-01 DIAGNOSIS — H33303 Unspecified retinal break, bilateral: Secondary | ICD-10-CM | POA: Diagnosis not present

## 2020-04-05 DIAGNOSIS — M9902 Segmental and somatic dysfunction of thoracic region: Secondary | ICD-10-CM | POA: Diagnosis not present

## 2020-04-05 DIAGNOSIS — M9908 Segmental and somatic dysfunction of rib cage: Secondary | ICD-10-CM | POA: Diagnosis not present

## 2020-04-05 DIAGNOSIS — M5386 Other specified dorsopathies, lumbar region: Secondary | ICD-10-CM | POA: Diagnosis not present

## 2020-04-05 DIAGNOSIS — M9903 Segmental and somatic dysfunction of lumbar region: Secondary | ICD-10-CM | POA: Diagnosis not present

## 2020-04-07 ENCOUNTER — Encounter: Payer: Self-pay | Admitting: Psychiatry

## 2020-04-07 ENCOUNTER — Other Ambulatory Visit: Payer: Self-pay

## 2020-04-07 ENCOUNTER — Ambulatory Visit (INDEPENDENT_AMBULATORY_CARE_PROVIDER_SITE_OTHER): Payer: BC Managed Care – PPO | Admitting: Psychiatry

## 2020-04-07 DIAGNOSIS — F411 Generalized anxiety disorder: Secondary | ICD-10-CM | POA: Diagnosis not present
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
## 2020-04-08 DIAGNOSIS — M5386 Other specified dorsopathies, lumbar region: Secondary | ICD-10-CM | POA: Diagnosis not present

## 2020-04-08 DIAGNOSIS — M9902 Segmental and somatic dysfunction of thoracic region: Secondary | ICD-10-CM | POA: Diagnosis not present

## 2020-04-08 DIAGNOSIS — M9908 Segmental and somatic dysfunction of rib cage: Secondary | ICD-10-CM | POA: Diagnosis not present

## 2020-04-08 DIAGNOSIS — M9903 Segmental and somatic dysfunction of lumbar region: Secondary | ICD-10-CM | POA: Diagnosis not present

## 2020-04-13 ENCOUNTER — Telehealth: Payer: Self-pay | Admitting: Internal Medicine

## 2020-04-13 MED ORDER — METHYLPHENIDATE HCL ER (OSM) 36 MG PO TBCR
36.0000 mg | EXTENDED_RELEASE_TABLET | Freq: Every day | ORAL | 0 refills | Status: DC
Start: 2020-04-13 — End: 2020-05-10

## 2020-04-13 NOTE — Telephone Encounter (Signed)
Pt is calling in stating that she has 4 on hand of Rx methylphenidate (CONCERTA) 36 MG and would like to see if she can go ahead and get it sent in before the weekend.  Pt also stated that the pharmacy said that it is okay to send in extra Rs's dated for the future and they will put it on file.  [facility] [address]

## 2020-04-13 NOTE — Telephone Encounter (Signed)
Sent in electronically .  

## 2020-04-13 NOTE — Addendum Note (Signed)
Addended byBerniece Andreas K on: 04/13/2020 02:02 PM   Modules accepted: Orders

## 2020-04-13 NOTE — Telephone Encounter (Signed)
Last Video Visit: 03/24/20 Last Refill: 03/13/2020 Disp: 30 R: 0 Future appt: None scheduled  Is this ok to refill?

## 2020-04-14 NOTE — Telephone Encounter (Signed)
Patient is aware that refill has been sent.  

## 2020-04-20 ENCOUNTER — Other Ambulatory Visit: Payer: Self-pay

## 2020-04-20 ENCOUNTER — Ambulatory Visit (INDEPENDENT_AMBULATORY_CARE_PROVIDER_SITE_OTHER): Payer: BC Managed Care – PPO | Admitting: Psychiatry

## 2020-04-20 ENCOUNTER — Encounter: Payer: Self-pay | Admitting: Psychiatry

## 2020-04-20 DIAGNOSIS — F411 Generalized anxiety disorder: Secondary | ICD-10-CM

## 2020-04-20 DIAGNOSIS — L503 Dermatographic urticaria: Secondary | ICD-10-CM | POA: Diagnosis not present

## 2020-04-20 DIAGNOSIS — L718 Other rosacea: Secondary | ICD-10-CM | POA: Diagnosis not present

## 2020-04-20 DIAGNOSIS — L700 Acne vulgaris: Secondary | ICD-10-CM | POA: Diagnosis not present

## 2020-04-20 NOTE — Progress Notes (Signed)
      [facility] Counselor/Therapist Progress Note  Patient ID: Terri [name], MRN: [id number],    Date: 04/20/2020  Time Spent: 50 minutes    Treatment Type: Individual Therapy  Reported Symptoms: anxiety  Mental Status Exam:  Appearance:   Well Groomed     Behavior:  Appropriate  Motor:  Normal  Speech/Language:   Clear and Coherent  Affect:  Appropriate  Mood:  anxious  Thought process:  normal  Thought content:    WNL  Sensory/Perceptual disturbances:    WNL  Orientation:  oriented to person, place, time/date and situation  Attention:  Good  Concentration:  Good  Memory:  WNL  Fund of knowledge:   Good  Insight:    Good  Judgment:   Good  Impulse Control:  Good   Risk Assessment: Danger to Self:  No Self-injurious Behavior: No Danger to Others: No Duty to Warn:no Physical Aggression / Violence:No  Access to Firearms a concern: No  Gang Involvement:No   Subjective: Client states she just returned from [place], [place] visiting the theme parks with her boyfriend.  She states that his travel has picked up with his business.  She realized that they spent so much time together and now there are longer stretches when they are not together.  Today we started with the bilateral stimulation hand paddles and then some eye-movement focusing on the anxiety the client was feeling when she is not with her boyfriend.  She knows where he is and that he will not cheat on her.  As we processed her anxiety was at a subjective units of distress of 5.  She feels it in her chest.  She stated that she realizes she is waiting for the other shoe to drop.  If someone gets mad at her or she annoys them then they will leave.  This is narrative from her family of origin.  As we continue to process the client's anxiety dropped to a subjective units of distress of 3.  We integrated the positive cognition, "I have to choose to trust."  Interventions: Assertiveness/Communication, Motivational  Interviewing, Solution-Oriented/Positive Psychology, [employer] Desensitization and Reprocessing (EMDR) and Insight-Oriented  Diagnosis:   ICD-10-CM   1. Generalized anxiety disorder  F41.1     Plan: Positive self talk, self-care, mood independent behavior, assertiveness, boundaries.  [name] Atina Feeley, [facility]

## 2020-04-21 DIAGNOSIS — M5386 Other specified dorsopathies, lumbar region: Secondary | ICD-10-CM | POA: Diagnosis not present

## 2020-04-21 DIAGNOSIS — M75100 Unspecified rotator cuff tear or rupture of unspecified shoulder, not specified as traumatic: Secondary | ICD-10-CM | POA: Diagnosis not present

## 2020-04-21 DIAGNOSIS — M9902 Segmental and somatic dysfunction of thoracic region: Secondary | ICD-10-CM | POA: Diagnosis not present

## 2020-04-21 DIAGNOSIS — M9903 Segmental and somatic dysfunction of lumbar region: Secondary | ICD-10-CM | POA: Diagnosis not present

## 2020-04-21 DIAGNOSIS — M9905 Segmental and somatic dysfunction of pelvic region: Secondary | ICD-10-CM | POA: Diagnosis not present

## 2020-04-27 NOTE — Progress Notes (Signed)
[name] [address] [place] [address] Phone: [phone] Subjective:   Terri [name], am serving as a scribe for Dr. Antoine Primas. This visit occurred during the SARS-CoV-2 public health emergency.  Safety protocols were in place, including screening questions prior to the visit, additional usage of staff PPE, and extensive cleaning of exam room while observing appropriate contact time as indicated for disinfecting solutions.   I'm seeing this patient by the request  of:  Panosh, [name], MD  CC: Right knee pain, right shoulder pain, back pain  WIO:[id number]   03/03/2020 Right anterior knee pain Patient's x-rays were unremarkable.  Patient did have an area that seemed to be more of a hematoma previously on ultrasound.  Continuing to have pain and we discussed the potential for advanced imaging.  Patient though would like to hold off on this with her having more above work-up of her lower back by another provider.  Follow-up with me again if any worsening pain or if she wants to change management.  Update 04/28/2020 Kamaiya Antilla is a 55 y.o. female coming in with complaint of back, right knee, and neck pain. OMT 03/03/2020. Patient states that her knee pain is improving.   Having right shoulder pain. Previously seen for shoulder pain in 2019. Pain with adduction in the anterior aspect and with flexion pain occurs in posterior aspect. Has had ART therapy with chiro in town for shoulder pain. Would like to continue with this therapy but is considering an injection at a later date. Patient is reaching a lot more at work and her work is busy. She is going to make ergonomic changes to help with pain. Has been using ice, heat and voltaren gel. Pain is radiating up into her neck.   Medications patient has been prescribed: None  Taking:         Reviewed prior external information including notes and imaging from previsou exam, outside providers and  external EMR if available.   As well as notes that were available from care everywhere and other healthcare systems.  Past medical history, social, surgical and family history all reviewed in electronic medical record.  No pertanent information unless stated regarding to the chief complaint.   Past Medical History:  Diagnosis Date  . Anxiety   . Depression   . Environmental allergies   . GERD (gastroesophageal reflux disease)   . Headache(784.0)   . Recurrent sinusitis    ent and pulm eval in past.    Allergies  Allergen Reactions  . Augmentin [Amoxicillin-Pot Clavulanate] Nausea Only  . Celecoxib     REACTION: unspecified  . Sulfa Antibiotics   . Sulfonamide Derivatives     REACTION: hives, throat swelling  . Monistat [Miconazole] Other (See Comments)    Sensitive to topical vaginal  Azoles No side effects with oral Diflucan     Review of Systems:  No headache, visual changes, nausea, vomiting, diarrhea, constipation, dizziness, abdominal pain, skin rash, fevers, chills, night sweats, weight loss, swollen lymph nodes, body aches, joint swelling, chest pain, shortness of breath, mood changes. POSITIVE muscle aches  Objective  Blood pressure 130/82, pulse (!) 113, height 5\' 3"  (1.6 m), weight 126 lb (57.2 kg), last menstrual period 07/19/2017, SpO2 98 %.   General: No apparent distress alert and oriented x3 mood and affect normal, dressed appropriately.  HEENT: Pupils equal, extraocular movements intact  Respiratory: Patient's speak in full sentences and does not appear short of breath  Cardiovascular: No lower extremity edema, non tender, no erythema  Gait normal with good balance and coordination.  MSK:   Right shoulder exam shows positive impingement noted.  Patient does have pain with empty can but no significant weakness.  Mild limited range of motion with internal range of motion.  The patient does have hypermobility.  Patient does have unfortunately positive O'Brien's  as well.  Back -back exam-significant tightness more around the sacroiliac and the lumbar spine.  Seems to be right greater than left today.  Tightness noted in left with Pearlean Brownie.  Patient does have pelvic shear it appears as well.  Osteopathic findings  C2 flexed rotated and side bent right C6 flexed rotated and side bent left T3 extended rotated and side bent right inhaled rib T9 extended rotated and side bent left L2 flexed rotated and side bent right Sacrum right on right Pelvic shear  Limited musculoskeletal ultrasound was performed and interpreted by Judi Saa  Limited ultrasound of patient's right shoulder shows patient has some very mild subacromial bursitis.  Patient does have what appears to be a very small acute on chronic tear of the supraspinatus with no significant retraction.  Partial thickness tear noted with scar tissue formation. 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     Assessment and Plan:        The above documentation has been reviewed and is accurate and complete Judi Saa, DO       Note: This dictation was prepared with Dragon dictation along with smaller phrase technology. Any transcriptional errors that result from this process are unintentional.

## 2020-04-28 ENCOUNTER — Other Ambulatory Visit: Payer: Self-pay

## 2020-04-28 ENCOUNTER — Ambulatory Visit: Payer: BC Managed Care – PPO | Admitting: Family Medicine

## 2020-04-28 ENCOUNTER — Encounter: Payer: Self-pay | Admitting: Family Medicine

## 2020-04-28 ENCOUNTER — Ambulatory Visit: Payer: Self-pay

## 2020-04-28 VITALS — BP 130/82 | HR 113 | Ht 63.0 in | Wt 126.0 lb

## 2020-04-28 DIAGNOSIS — M25511 Pain in right shoulder: Secondary | ICD-10-CM

## 2020-04-28 DIAGNOSIS — M999 Biomechanical lesion, unspecified: Secondary | ICD-10-CM | POA: Diagnosis not present

## 2020-04-28 DIAGNOSIS — M7551 Bursitis of right shoulder: Secondary | ICD-10-CM

## 2020-04-28 DIAGNOSIS — M533 Sacrococcygeal disorders, not elsewhere classified: Secondary | ICD-10-CM

## 2020-04-28 DIAGNOSIS — R768 Other specified abnormal immunological findings in serum: Secondary | ICD-10-CM

## 2020-04-28 DIAGNOSIS — G8929 Other chronic pain: Secondary | ICD-10-CM | POA: Diagnosis not present

## 2020-04-28 NOTE — Patient Instructions (Addendum)
  Exercises Have fun with the boy toy in [place] after activity See me again in 6 weeks

## 2020-04-29 ENCOUNTER — Encounter: Payer: Self-pay | Admitting: Family Medicine

## 2020-04-29 NOTE — Assessment & Plan Note (Signed)
Patient has had bursitis previously but does have a very small what appears to be partial thickness tear noted of the rotator cuff as well.  Discussed with patient at length.  Given home exercises and icing regimen.  Patient we will monitor.  Discussed ergonomics at work.  Depending on how patient responds will consider injection at follow-up.  Also can consider formal physical therapy.  Follow-up again in 6 weeks

## 2020-04-29 NOTE — Assessment & Plan Note (Signed)
Still has not seen rheumatology.  Patient will consider it.  May need to retest at some point

## 2020-04-29 NOTE — Assessment & Plan Note (Signed)
   Decision today to treat with OMT was based on Physical Exam  After verbal consent patient was treated with  ME, FPR techniques in cervical, thoracic, rib, lumbar and sacral, pelvis areas, all areas are chronic   Patient tolerated the procedure well with improvement in symptoms  Patient given exercises, stretches and lifestyle modifications  See medications in patient instructions if given  Patient will follow up in 4-8 weeks 

## 2020-04-29 NOTE — Assessment & Plan Note (Signed)
Chronic problem with mild exacerbation.  Responded well though to manipulation.  Focus more on muscle energy and FPR techniques.

## 2020-05-07 DIAGNOSIS — M9903 Segmental and somatic dysfunction of lumbar region: Secondary | ICD-10-CM | POA: Diagnosis not present

## 2020-05-07 DIAGNOSIS — M5386 Other specified dorsopathies, lumbar region: Secondary | ICD-10-CM | POA: Diagnosis not present

## 2020-05-07 DIAGNOSIS — M75100 Unspecified rotator cuff tear or rupture of unspecified shoulder, not specified as traumatic: Secondary | ICD-10-CM | POA: Diagnosis not present

## 2020-05-07 DIAGNOSIS — M9905 Segmental and somatic dysfunction of pelvic region: Secondary | ICD-10-CM | POA: Diagnosis not present

## 2020-05-07 DIAGNOSIS — M9902 Segmental and somatic dysfunction of thoracic region: Secondary | ICD-10-CM | POA: Diagnosis not present

## 2020-05-10 ENCOUNTER — Telehealth: Payer: Self-pay | Admitting: Internal Medicine

## 2020-05-10 MED ORDER — METHYLPHENIDATE HCL ER (OSM) 36 MG PO TBCR: 36.0000 mg | EXTENDED_RELEASE_TABLET | Freq: Every day | ORAL | 0 refills | Status: DC

## 2020-05-10 NOTE — Telephone Encounter (Signed)
Patient needs a refill for Concerta.  [facility] [address]

## 2020-05-10 NOTE — Telephone Encounter (Signed)
Sent in electronically .  

## 2020-05-10 NOTE — Telephone Encounter (Signed)
Last OV: 03/24/2020 Last Refill: 04/13/2020 Disp:   30      R: 1 Future  OV: None scheduled

## 2020-05-11 ENCOUNTER — Ambulatory Visit (INDEPENDENT_AMBULATORY_CARE_PROVIDER_SITE_OTHER): Payer: BC Managed Care – PPO | Admitting: Psychiatry

## 2020-05-11 ENCOUNTER — Encounter: Payer: Self-pay | Admitting: Psychiatry

## 2020-05-11 ENCOUNTER — Other Ambulatory Visit: Payer: Self-pay

## 2020-05-11 DIAGNOSIS — F411 Generalized anxiety disorder: Secondary | ICD-10-CM
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
## 2020-05-11 NOTE — Telephone Encounter (Signed)
Noted  

## 2020-05-19 DIAGNOSIS — M9903 Segmental and somatic dysfunction of lumbar region: Secondary | ICD-10-CM | POA: Diagnosis not present

## 2020-05-19 DIAGNOSIS — M5386 Other specified dorsopathies, lumbar region: Secondary | ICD-10-CM | POA: Diagnosis not present

## 2020-05-19 DIAGNOSIS — M9905 Segmental and somatic dysfunction of pelvic region: Secondary | ICD-10-CM | POA: Diagnosis not present

## 2020-05-19 DIAGNOSIS — M9902 Segmental and somatic dysfunction of thoracic region: Secondary | ICD-10-CM | POA: Diagnosis not present

## 2020-05-19 DIAGNOSIS — M75100 Unspecified rotator cuff tear or rupture of unspecified shoulder, not specified as traumatic: Secondary | ICD-10-CM | POA: Diagnosis not present

## 2020-05-27 ENCOUNTER — Telehealth (INDEPENDENT_AMBULATORY_CARE_PROVIDER_SITE_OTHER): Payer: BC Managed Care – PPO | Admitting: Family Medicine

## 2020-05-27 DIAGNOSIS — R300 Dysuria: Secondary | ICD-10-CM | POA: Diagnosis not present

## 2020-05-27 LAB — POCT URINALYSIS DIPSTICK
Bilirubin, UA: NEGATIVE
Blood, UA: POSITIVE
Glucose, UA: NEGATIVE
Ketones, UA: NEGATIVE
Nitrite, UA: NEGATIVE
Protein, UA: POSITIVE — AB
Spec Grav, UA: 1.015 (ref 1.010–1.025)
Urobilinogen, UA: 0.2 E.U./dL
pH, UA: 7 (ref 5.0–8.0)

## 2020-05-27 MED ORDER — NITROFURANTOIN MONOHYD MACRO 100 MG PO CAPS: 100.0000 mg | ORAL_CAPSULE | Freq: Two times a day (BID) | ORAL | 0 refills | Status: DC

## 2020-05-27 NOTE — Patient Instructions (Signed)
-  I sent the medication(s) we discussed to your pharmacy: Meds ordered this encounter  Medications  . nitrofurantoin, macrocrystal-monohydrate, (MACROBID) 100 MG capsule    Sig: Take 1 capsule (100 mg total) by mouth 2 (two) times daily.    Dispense:  14 capsule    Refill:  0   Please contact your primary care office on Monday to check on the results of your urine studies.   I hope you are feeling better soon!  Seek in person care promptly if your symptoms worsen, new concerns arise or you are not improving with treatment.  It was nice to meet you today. I help El[facility] out with telemedicine visits on Tuesdays and Thursdays and am available for visits on those days. If you have any concerns or questions following this visit please schedule a follow up visit with your Primary Care doctor or seek care at a local urgent care clinic to avoid delays in care.

## 2020-05-27 NOTE — Progress Notes (Signed)
Virtual Visit via Video Note  I connected with Terri [name]  on 05/27/20 at  3:40 PM EDT by a video enabled telemedicine application and verified that I am speaking with the correct person using two identifiers.  Location patient: home, [place] Location provider:work or home office Persons participating in the virtual visit: patient, provider  I discussed the limitations of evaluation and management by telemedicine and the availability of in person appointments. The patient expressed understanding and agreed to proceed.   HPI:  Acute telemedicine visit for dysuria: -Onset: 2-3 days ago -Symptoms include:burning with urinary, urgency, frequency -she says someone at her PCP office told her she had to do urine studies so she left urine there - not orders or results in the system.  -Denies: fevers, NVD, flank pain, blood in the urine, abnormal vaginal discharge, abd/pelvic pain, new sexual partners -Has tried:cranberry juice and water - now feeling better -Pertinent past medical history: has had a UTI in the past and this feels the same -Pertinent medication allergies: Allergies  Allergen Reactions  . Augmentin [Amoxicillin-Pot Clavulanate] Nausea Only  . Celecoxib     REACTION: unspecified  . Sulfa Antibiotics   . Sulfonamide Derivatives     REACTION: hives, throat swelling  . Monistat [Miconazole] Other (See Comments)    Sensitive to topical vaginal  Azoles No side effects with oral Diflucan    ROS: See pertinent positives and negatives per HPI.  Past Medical History:  Diagnosis Date  . Anxiety   . Depression   . Environmental allergies   . GERD (gastroesophageal reflux disease)   . Headache(784.0)   . Recurrent sinusitis    ent and pulm eval in past.    Past Surgical History:  Procedure Laterality Date  . HIP ARTHROSCOPY W/ LABRAL DEBRIDEMENT    . left hip surgery     [facility]     Current Outpatient Medications:  .  nitrofurantoin, macrocrystal-monohydrate, (MACROBID) 100 MG  capsule, Take 1 capsule (100 mg total) by mouth 2 (two) times daily., Disp: 14 capsule, Rfl: 0 .  ALPRAZolam (XANAX) 0.25 MG tablet, Take 1 tablet (0.25 mg total) by mouth 3 (three) times daily as needed for anxiety (panic)., Disp: 20 tablet, Rfl: 0 .  buPROPion (WELLBUTRIN XL) 300 MG 24 hr tablet, TAKE 1 TABLET BY MOUTH EVERY DAY, Disp: 90 tablet, Rfl: 1 .  fish oil-omega-3 fatty acids 1000 MG capsule, Take 1 g by mouth daily., Disp: , Rfl:  .  fluconazole (DIFLUCAN) 150 MG tablet, 1 pill today then repeat in 4 days, Disp: 2 tablet, Rfl: 0 .  fluticasone (FLONASE) 50 MCG/ACT nasal spray, SPRAY 2 SPRAYS INTO EACH NOSTRIL EVERY DAY, Disp: 16 g, Rfl: 11 .  gabapentin (NEURONTIN) 100 MG capsule, TAKE 2 CAPSULES BY MOUTH EVERY DAY AT BEDTIME, Disp: 60 capsule, Rfl: 3 .  Hydrocortisone Butyrate 0.1 % OINT, hydrocortisone butyrate 0.1 % topical ointment, Disp: , Rfl:  .  Ivermectin (SOOLANTRA) 1 % CREA, Soolantra 1 % topical cream, Disp: , Rfl:  .  LYSINE PO, Take 1,000 mg by mouth daily. for fever blisters., Disp: , Rfl:  .  meloxicam (MOBIC) 15 MG tablet, as needed. , Disp: , Rfl:  .  methylphenidate (CONCERTA) 36 MG PO CR tablet, Take 1 tablet (36 mg total) by mouth daily. Generic, Disp: 30 tablet, Rfl: 0 .  Norethin-Eth Estrad-Fe Biphas (LO LOESTRIN FE PO), Take by mouth., Disp: , Rfl:  .  Nutritional Supplements (DHEA PO), Take by mouth. , Disp: , Rfl:  .  spironolactone (ALDACTONE) 25 MG tablet, Take 25 mg by mouth daily., Disp: , Rfl:  .  SUMAtriptan (IMITREX) 100 MG tablet, Take 1 at onset of  Migraine headache   and can repeat in 2 hours ., Disp: 9 tablet, Rfl: 1 .  valACYclovir (VALTREX) 1000 MG tablet, Take 1 tablet (1,000 mg total) by mouth 2 (two) times daily. As direted, Disp: 180 tablet, Rfl: 1 .  Vitamin D, Ergocalciferol, (DRISDOL) 50000 units CAPS capsule, TAKE 1 CAPSULE EVERY 7 DAYS, Disp: 12 capsule, Rfl: 0  EXAM:  VITALS per patient if applicable:  GENERAL: alert, oriented,  appears well and in no acute distress  HEENT: atraumatic, conjunttiva clear, no obvious abnormalities on inspection of external nose and ears  NECK: normal movements of the head and neck  LUNGS: on inspection no signs of respiratory distress, breathing rate appears normal, no obvious gross SOB, gasping or wheezing  CV: no obvious cyanosis  MS: moves all visible extremities without noticeable abnormality  PSYCH/NEURO: pleasant and cooperative, no obvious depression or anxiety, speech and thought processing grossly intact  ASSESSMENT AND PLAN:  Discussed the following assessment and plan:  Dysuria  -we discussed possible serious and likely etiologies, options for evaluation and workup, limitations of telemedicine visit vs in person visit, treatment, treatment risks and precautions. Pt prefers to treat via telemedicine empirically rather than in person at this moment.  Query uncomplicated cystitis versus other.  She opted for empiric treatment with Macrobid 100 mg twice daily for 7 days.  I also let her know I would check with her primary care office on the urine studies.  Advised I would be back on shift for [place] next Tuesday and can check on those results if they were sent to me. Scheduled follow up with PCP offered: She agrees to schedule in person follow-up with her primary care doctor or her gynecologist if symptoms do not resolve with treatment. Advised to seek prompt in person care if worsening, new symptoms arise, or if is not improving with treatment. Discussed options for inperson care if PCP office not available. Did let this patient know that I only do telemedicine on Tuesdays and Thursdays for [facility]. Advised to schedule follow up visit with PCP or [facility] if any further questions or concerns to avoid delays in care.   I discussed the assessment and treatment plan with the patient. The patient was provided an opportunity to ask questions and all were answered. The patient agreed  with the plan and demonstrated an understanding of the instructions.     Terri Koyanagi, DO

## 2020-05-29 LAB — URINE CULTURE
MICRO NUMBER:: 11854797
SPECIMEN QUALITY:: ADEQUATE

## 2020-06-01 ENCOUNTER — Ambulatory Visit: Payer: BC Managed Care – PPO | Admitting: Psychiatry

## 2020-06-03 DIAGNOSIS — M75100 Unspecified rotator cuff tear or rupture of unspecified shoulder, not specified as traumatic: Secondary | ICD-10-CM | POA: Diagnosis not present

## 2020-06-03 DIAGNOSIS — M9902 Segmental and somatic dysfunction of thoracic region: Secondary | ICD-10-CM | POA: Diagnosis not present

## 2020-06-03 DIAGNOSIS — M9905 Segmental and somatic dysfunction of pelvic region: Secondary | ICD-10-CM | POA: Diagnosis not present

## 2020-06-03 DIAGNOSIS — M5386 Other specified dorsopathies, lumbar region: Secondary | ICD-10-CM | POA: Diagnosis not present

## 2020-06-03 DIAGNOSIS — M9903 Segmental and somatic dysfunction of lumbar region: Secondary | ICD-10-CM | POA: Diagnosis not present

## 2020-06-04 ENCOUNTER — Telehealth: Payer: Self-pay | Admitting: Internal Medicine

## 2020-06-04 DIAGNOSIS — L400 Psoriasis vulgaris: Secondary | ICD-10-CM | POA: Diagnosis not present

## 2020-06-04 NOTE — Telephone Encounter (Signed)
Patient has been informed of her results.  

## 2020-06-04 NOTE — Telephone Encounter (Signed)
Patient is calling back for results, please advise. CB is [phone]

## 2020-06-07 DIAGNOSIS — N959 Unspecified menopausal and perimenopausal disorder: Secondary | ICD-10-CM | POA: Diagnosis not present

## 2020-06-10 NOTE — Progress Notes (Signed)
[name] [address] [place] [address] Phone: [phone] Subjective:   Terri [name], am serving as a scribe for Dr. Antoine Primas. This visit occurred during the SARS-CoV-2 public health emergency.  Safety protocols were in place, including screening questions prior to the visit, additional usage of staff PPE, and extensive cleaning of exam room while observing appropriate contact time as indicated for disinfecting solutions.   I'm seeing this patient by the request  of:  [name], Terri Mends, MD  CC: Low back and neck pain follow-up  FSF:[id number]  Terri [name] is a 55 y.o. female coming in with complaint of back and neck pain. OMT 04/28/2020. Patient states that she is being more consistent with exercises which is helping shoulder. Using a cart a work that is now closer to her so she doesn't have to reach.   Pain in forearm and thumb had dissipated now that neck pain has improved.    Medications patient has been prescribed: None        Past Medical History:  Diagnosis Date  . Anxiety   . Depression   . Environmental allergies   . GERD (gastroesophageal reflux disease)   . Headache(784.0)   . Recurrent sinusitis    ent and pulm eval in past.    Allergies  Allergen Reactions  . Augmentin [Amoxicillin-Pot Clavulanate] Nausea Only  . Celecoxib     REACTION: unspecified  . Sulfa Antibiotics   . Sulfonamide Derivatives     REACTION: hives, throat swelling  . Monistat [Miconazole] Other (See Comments)    Sensitive to topical vaginal  Azoles No side effects with oral Diflucan     Review of Systems:  No headache, visual changes, nausea, vomiting, diarrhea, constipation, dizziness, abdominal pain, skin rash, fevers, chills, night sweats, weight loss, swollen lymph nodes,chest pain, shortness of breath, mood changes. POSITIVE muscle aches, body aches, joint swelling  Objective  Blood pressure 100/76, pulse 86, height 5\' 3"  (1.6 m),  weight 125 lb (56.7 kg), last menstrual period 07/19/2017, SpO2 98 %.   General: No apparent distress alert and oriented x3 mood and affect normal, dressed appropriately.  HEENT: Pupils equal, extraocular movements intact  Respiratory: Patient's speak in full sentences and does not appear short of breath  Cardiovascular: No lower extremity edema, non tender, no erythema  Gait normal with good balance and coordination.  MSK: Hypermobility of multiple joints noted.   Right shoulder exam shows the patient does have positive impingement still noted.  Patient does have some weakness noted of the rotator cuff compared to the contralateral side with 4 out of 5 strength.  Osteopathic findings  C2 flexed rotated and side bent right T3 extended rotated and side bent right inhaled rib T9 extended rotated and side bent left L2 flexed rotated and side bent right Sacrum right on right Pelvic shear noted right     Assessment and Plan:  Benign hypermobility syndrome  Continues to have hypermobility.  Does likely have an underlying autoimmune disease.  Patient is still not quite following up with her rheumatologist.  We discussed icing regimen and home exercises.  Patient is doing relatively well overall.  We discussed with patient about home exercises and avoiding certain activities.  Patient responded to manipulation.  Follow-up again in 6 weeks  Degenerative disc disease, cervical Chronic problem but stable.  We will continue to work on the posture and ergonomics.  Continue to do muscle energy.  Discussed different medications but is not  taking them on a regular basis.  Follow-up again 6 weeks  Acute bursitis of right shoulder Patient has had bursitis but also seems to have more of a potential rotator cuff tear.  I would like to consider the possibility of ultrasound and follow-up.  Patient is making some progress but very slow.  Patient is in agreement with this plan.  Follow-up again at that  time.    Nonallopathic problems  Decision today to treat with OMT was based on Physical Exam  After verbal consent patient was treated with  ME, FPR techniques in cervical, rib, thoracic, lumbar, and sacral  areas  Patient tolerated the procedure well with improvement in symptoms  Patient given exercises, stretches and lifestyle modifications  See medications in patient instructions if given  Patient will follow up in 4-8 weeks      The above documentation has been reviewed and is accurate and complete Judi Saa, DO       Note: This dictation was prepared with Dragon dictation along with smaller phrase technology. Any transcriptional errors that result from this process are unintentional.

## 2020-06-11 ENCOUNTER — Encounter: Payer: Self-pay | Admitting: Family Medicine

## 2020-06-11 ENCOUNTER — Other Ambulatory Visit: Payer: Self-pay

## 2020-06-11 ENCOUNTER — Ambulatory Visit (INDEPENDENT_AMBULATORY_CARE_PROVIDER_SITE_OTHER): Payer: BC Managed Care – PPO | Admitting: Family Medicine

## 2020-06-11 VITALS — BP 100/76 | HR 86 | Ht 63.0 in | Wt 125.0 lb

## 2020-06-11 DIAGNOSIS — M5030 Other cervical disc degeneration, unspecified cervical region: Secondary | ICD-10-CM | POA: Diagnosis not present

## 2020-06-11 DIAGNOSIS — M9908 Segmental and somatic dysfunction of rib cage: Secondary | ICD-10-CM

## 2020-06-11 DIAGNOSIS — M9905 Segmental and somatic dysfunction of pelvic region: Secondary | ICD-10-CM

## 2020-06-11 DIAGNOSIS — M9903 Segmental and somatic dysfunction of lumbar region: Secondary | ICD-10-CM

## 2020-06-11 DIAGNOSIS — M9904 Segmental and somatic dysfunction of sacral region: Secondary | ICD-10-CM

## 2020-06-11 DIAGNOSIS — M9902 Segmental and somatic dysfunction of thoracic region: Secondary | ICD-10-CM

## 2020-06-11 DIAGNOSIS — M357 Hypermobility syndrome: Secondary | ICD-10-CM

## 2020-06-11 DIAGNOSIS — M940 Chondrocostal junction syndrome [Tietze]: Secondary | ICD-10-CM

## 2020-06-11 DIAGNOSIS — M7551 Bursitis of right shoulder: Secondary | ICD-10-CM

## 2020-06-11 DIAGNOSIS — M9901 Segmental and somatic dysfunction of cervical region: Secondary | ICD-10-CM | POA: Diagnosis not present

## 2020-06-11 NOTE — Assessment & Plan Note (Signed)
Chronic problem but stable.  We will continue to work on the posture and ergonomics.  Continue to do muscle energy.  Discussed different medications but is not taking them on a regular basis.  Follow-up again 6 weeks

## 2020-06-11 NOTE — Patient Instructions (Signed)
Have fun in the [facility] Try to stretch hip flexors after walking See me in 6 weeks to [place] shoulder

## 2020-06-11 NOTE — Assessment & Plan Note (Signed)
Patient has had bursitis but also seems to have more of a potential rotator cuff tear.  I would like to consider the possibility of ultrasound and follow-up.  Patient is making some progress but very slow.  Patient is in agreement with this plan.  Follow-up again at that time.

## 2020-06-11 NOTE — Assessment & Plan Note (Signed)
  Continues to have hypermobility.  Does likely have an underlying autoimmune disease.  Patient is still not quite following up with her rheumatologist.  We discussed icing regimen and home exercises.  Patient is doing relatively well overall.  We discussed with patient about home exercises and avoiding certain activities.  Patient responded to manipulation.  Follow-up again in 6 weeks

## 2020-06-16 DIAGNOSIS — Z1152 Encounter for screening for COVID-19: Secondary | ICD-10-CM | POA: Diagnosis not present

## 2020-06-22 ENCOUNTER — Ambulatory Visit: Payer: BC Managed Care – PPO | Admitting: Psychiatry

## 2020-06-25 ENCOUNTER — Telehealth: Payer: Self-pay | Admitting: Internal Medicine

## 2020-06-25 NOTE — Telephone Encounter (Signed)
Patient is calling and requesting a refill for methylphenidate (CONCERTA) 36 MG PO CR tablet to be sent to   [id number] - [place], [place] - [address] AT [facility] [address]  [address] [place] [address]  Phone:  [phone] Fax:  [phone]  CB is [phone]

## 2020-06-28 MED ORDER — METHYLPHENIDATE HCL ER (OSM) 36 MG PO TBCR: 36.0000 mg | EXTENDED_RELEASE_TABLET | Freq: Every day | ORAL | 0 refills | Status: DC

## 2020-06-28 NOTE — Telephone Encounter (Signed)
Noted  

## 2020-06-28 NOTE — Telephone Encounter (Signed)
Done

## 2020-06-28 NOTE — Telephone Encounter (Signed)
Last OV:  05/27/2020 Last Refill: 05/10/2020 Disp:30     R: 0 Future OV:  None scheduled

## 2020-06-30 DIAGNOSIS — M9902 Segmental and somatic dysfunction of thoracic region: Secondary | ICD-10-CM | POA: Diagnosis not present

## 2020-06-30 DIAGNOSIS — M75100 Unspecified rotator cuff tear or rupture of unspecified shoulder, not specified as traumatic: Secondary | ICD-10-CM | POA: Diagnosis not present

## 2020-06-30 DIAGNOSIS — M5386 Other specified dorsopathies, lumbar region: Secondary | ICD-10-CM | POA: Diagnosis not present

## 2020-06-30 DIAGNOSIS — M9903 Segmental and somatic dysfunction of lumbar region: Secondary | ICD-10-CM | POA: Diagnosis not present

## 2020-06-30 DIAGNOSIS — M9905 Segmental and somatic dysfunction of pelvic region: Secondary | ICD-10-CM | POA: Diagnosis not present

## 2020-07-13 ENCOUNTER — Ambulatory Visit: Payer: BC Managed Care – PPO | Admitting: Psychiatry

## 2020-07-21 NOTE — Progress Notes (Signed)
Terri [name] [address] [place] [address] Phone: [phone] Subjective:   I [name] am serving as a Neurosurgeon for Dr. Antoine Primas.  This visit occurred during the SARS-CoV-2 public health emergency.  Safety protocols were in place, including screening questions prior to the visit, additional usage of staff PPE, and extensive cleaning of exam room while observing appropriate contact time as indicated for disinfecting solutions.   I'm seeing this patient by the request  of:  Panosh, [name], MD  CC: Neck pain and back pain follow-up  [id number]  Terri [name] is a 55 y.o. female coming in with complaint of back and neck pain. OMT 06/11/2020. R shoulder bursitis. Patient states she is doing well. States the shoulder is a lot better. Has been doing the exercises. States she has changed her room around which has also made a difference. Patient states she fell down a mountain and injured the right knee. Pain walking down stairs. Injury was about a year ago.            Reviewed prior external information including notes and imaging from previsou exam, outside providers and external EMR if available.   As well as notes that were available from care everywhere and other healthcare systems.  Past medical history, social, surgical and family history all reviewed in electronic medical record.  No pertanent information unless stated regarding to the chief complaint.   Past Medical History:  Diagnosis Date   Anxiety    Depression    Environmental allergies    GERD (gastroesophageal reflux disease)    Headache(784.0)    Recurrent sinusitis    ent and pulm eval in past.    Allergies  Allergen Reactions   Augmentin [Amoxicillin-Pot Clavulanate] Nausea Only   Celecoxib     REACTION: unspecified   Sulfa Antibiotics    Sulfonamide Derivatives     REACTION: hives, throat swelling   Monistat [Miconazole] Other (See Comments)    Sensitive to  topical vaginal  Azoles No side effects with oral Diflucan     Review of Systems:  No headache, visual changes, nausea, vomiting, diarrhea, constipation, dizziness, abdominal pain, skin rash, fevers, chills, night sweats, weight loss, swollen lymph nodes, , chest pain, shortness of breath, mood changes. POSITIVE muscle aches, body aches, joint swelling  Objective  Blood pressure 110/70, height 5\' 3"  (1.6 m), weight 125 lb (56.7 kg), last menstrual period 07/19/2017.   General: No apparent distress alert and oriented x3 mood and affect normal, dressed appropriately.  HEENT: Pupils equal, extraocular movements intact  Respiratory: Patient's speak in full sentences and does not appear short of breath  Hypermobility noted to multiple joints.  Patient does have some loss of lordosis to the lumbar spine.  Tightness with FABER test right greater than left.  5 out of 5 strength of the lower extremities noted.  Patient's neck exam also lacks last 5 degrees of extension.  Negative Spurling's today.  Patient does have some mild tenderness to palpation over the sacroiliac joints as well bilaterally.  Osteopathic findings  C2 flexed rotated and side bent right C6 flexed rotated and side bent left T3 extended rotated and side bent right inhaled rib T9 extended rotated and side bent left L2 flexed rotated and side bent right Sacrum right on right Pelvic shear noted      Assessment and Plan:  Lumbar radicular pain Does have radicular pain previously but seems to be doing relatively well overall at the  moment.  Discussed icing regimen and home exercises.  Discussed with her PCP which wants to avoid.  Patient will increase activity slowly.  Follow-up with me in general in 6 to 8 weeks. No change in medications.   Nonallopathic problems  Decision today to treat with OMT was based on Physical Exam  After verbal consent patient was treated with HVLA, ME, FPR techniques in cervical, rib, thoracic,  lumbar, and sacral and pelvis areas  Patient tolerated the procedure well with improvement in symptoms  Patient given exercises, stretches and lifestyle modifications  See medications in patient instructions if given  Patient will follow up in 4-8 weeks     The above documentation has been reviewed and is accurate and complete Judi Saa, DO        Note: This dictation was prepared with Dragon dictation along with smaller phrase technology. Any transcriptional errors that result from this process are unintentional.

## 2020-07-22 ENCOUNTER — Encounter: Payer: Self-pay | Admitting: Family Medicine

## 2020-07-22 ENCOUNTER — Ambulatory Visit (INDEPENDENT_AMBULATORY_CARE_PROVIDER_SITE_OTHER): Payer: BC Managed Care – PPO | Admitting: Family Medicine

## 2020-07-22 ENCOUNTER — Other Ambulatory Visit: Payer: Self-pay

## 2020-07-22 VITALS — BP 110/70 | Ht 63.0 in | Wt 125.0 lb

## 2020-07-22 DIAGNOSIS — M9902 Segmental and somatic dysfunction of thoracic region: Secondary | ICD-10-CM

## 2020-07-22 DIAGNOSIS — M9903 Segmental and somatic dysfunction of lumbar region: Secondary | ICD-10-CM

## 2020-07-22 DIAGNOSIS — M5416 Radiculopathy, lumbar region: Secondary | ICD-10-CM | POA: Diagnosis not present

## 2020-07-22 DIAGNOSIS — M9905 Segmental and somatic dysfunction of pelvic region: Secondary | ICD-10-CM

## 2020-07-22 DIAGNOSIS — M9901 Segmental and somatic dysfunction of cervical region: Secondary | ICD-10-CM | POA: Diagnosis not present

## 2020-07-22 DIAGNOSIS — M9908 Segmental and somatic dysfunction of rib cage: Secondary | ICD-10-CM

## 2020-07-22 DIAGNOSIS — M9904 Segmental and somatic dysfunction of sacral region: Secondary | ICD-10-CM

## 2020-07-22 NOTE — Patient Instructions (Addendum)
Good to see you Hip abductor and askling exercises Have a great trip! See me again in 5-6 weeks

## 2020-07-22 NOTE — Assessment & Plan Note (Signed)
Does have radicular pain previously but seems to be doing relatively well overall at the moment.  Discussed icing regimen and home exercises.  Discussed with her PCP which wants to avoid.  Patient will increase activity slowly.  Follow-up with me in general in 6 to 8 weeks. No change in medications.

## 2020-07-28 ENCOUNTER — Telehealth: Payer: Self-pay | Admitting: Internal Medicine

## 2020-07-28 ENCOUNTER — Other Ambulatory Visit: Payer: Self-pay | Admitting: Internal Medicine

## 2020-07-28 MED ORDER — METHYLPHENIDATE HCL ER (OSM) 36 MG PO TBCR: 36.0000 mg | EXTENDED_RELEASE_TABLET | Freq: Every day | ORAL | 0 refills | Status: DC

## 2020-07-28 NOTE — Telephone Encounter (Signed)
methylphenidate (CONCERTA) 36 MG PO CR tablet  [id number] - [place], [place] - [address] AT [facility] [address] [facility] [address] Phone:  [phone]  Fax:  [phone]

## 2020-07-28 NOTE — Telephone Encounter (Signed)
Sent in electronically last visit with me was March 2022to do med check in the fall

## 2020-07-28 NOTE — Addendum Note (Signed)
Addended byBerniece Andreas K on: 07/28/2020 01:44 PM   Modules accepted: Orders

## 2020-07-29 DIAGNOSIS — M9903 Segmental and somatic dysfunction of lumbar region: Secondary | ICD-10-CM | POA: Diagnosis not present

## 2020-07-29 DIAGNOSIS — M5386 Other specified dorsopathies, lumbar region: Secondary | ICD-10-CM | POA: Diagnosis not present

## 2020-07-29 DIAGNOSIS — M9902 Segmental and somatic dysfunction of thoracic region: Secondary | ICD-10-CM | POA: Diagnosis not present

## 2020-07-29 DIAGNOSIS — M75100 Unspecified rotator cuff tear or rupture of unspecified shoulder, not specified as traumatic: Secondary | ICD-10-CM | POA: Diagnosis not present

## 2020-07-29 DIAGNOSIS — M9905 Segmental and somatic dysfunction of pelvic region: Secondary | ICD-10-CM | POA: Diagnosis not present

## 2020-08-10 DIAGNOSIS — M75100 Unspecified rotator cuff tear or rupture of unspecified shoulder, not specified as traumatic: Secondary | ICD-10-CM | POA: Diagnosis not present

## 2020-08-10 DIAGNOSIS — M9905 Segmental and somatic dysfunction of pelvic region: Secondary | ICD-10-CM | POA: Diagnosis not present

## 2020-08-10 DIAGNOSIS — M9902 Segmental and somatic dysfunction of thoracic region: Secondary | ICD-10-CM | POA: Diagnosis not present

## 2020-08-10 DIAGNOSIS — M9903 Segmental and somatic dysfunction of lumbar region: Secondary | ICD-10-CM | POA: Diagnosis not present

## 2020-08-10 DIAGNOSIS — M5386 Other specified dorsopathies, lumbar region: Secondary | ICD-10-CM | POA: Diagnosis not present

## 2020-08-23 DIAGNOSIS — L710 Perioral dermatitis: Secondary | ICD-10-CM | POA: Diagnosis not present

## 2020-08-23 DIAGNOSIS — L814 Other melanin hyperpigmentation: Secondary | ICD-10-CM | POA: Diagnosis not present

## 2020-08-31 ENCOUNTER — Telehealth: Payer: Self-pay

## 2020-08-31 MED ORDER — METHYLPHENIDATE HCL ER (OSM) 36 MG PO TBCR: 36.0000 mg | EXTENDED_RELEASE_TABLET | Freq: Every day | ORAL | 0 refills | Status: DC

## 2020-08-31 NOTE — Telephone Encounter (Signed)
Done

## 2020-08-31 NOTE — Addendum Note (Signed)
Addended by: Gershon Crane A on: 08/31/2020 12:24 PM   Modules accepted: Orders

## 2020-08-31 NOTE — Telephone Encounter (Signed)
Patient called wanting Rx refill methylphenidate (CONCERTA) 36 MG PO CR tablet  Pt stated only 4 pills left would like a call back

## 2020-08-31 NOTE — Telephone Encounter (Signed)
Noted  

## 2020-08-31 NOTE — Progress Notes (Signed)
[name] [address] [place] [address] Phone: [phone] Subjective:   [name], am serving as a scribe for Dr. Antoine Primas.  This visit occurred during the SARS-CoV-2 public health emergency.  Safety protocols were in place, including screening questions prior to the visit, additional usage of staff PPE, and extensive cleaning of exam room while observing appropriate contact time as indicated for disinfecting solutions.   I'm seeing this patient by the request  of:  Panosh, [name], MD  CC: Pain  PHX:[id number]  Kynzlie Hilleary is a 55 y.o. female coming in with complaint of back and neck pain. OMT 07/22/2020. Patient states that she started HRT 1.5 months ago, estradiol patch and progesterone. Since starting medications she has had joint swelling, achy joints and weight gain.  Patient states that has noticed more tightness.  Neck has been painful and tight since last visit.   Medications patient has been prescribed: None  Taking:  Patient has had difficulty with her shoulder as well and had what appeared to be an acute on chronic rotator cuff tear previously patient at last exam, she was making some improvement. States that the shoulder is doing relatively well.      Past Medical History:  Diagnosis Date   Anxiety    Depression    Environmental allergies    GERD (gastroesophageal reflux disease)    Headache(784.0)    Recurrent sinusitis    ent and pulm eval in past.    Allergies  Allergen Reactions   Augmentin [Amoxicillin-Pot Clavulanate] Nausea Only   Celecoxib     REACTION: unspecified   Sulfa Antibiotics    Sulfonamide Derivatives     REACTION: hives, throat swelling   Monistat [Miconazole] Other (See Comments)    Sensitive to topical vaginal  Azoles No side effects with oral Diflucan     Review of Systems:  No headache, visual changes, nausea, vomiting, diarrhea, constipation, dizziness, abdominal pain, skin  rash, fevers, chills, night sweats, weight loss, swollen lymph nodes,chest pain, shortness of breath, mood changes. POSITIVE muscle aches, body aches, joint swelling  Objective  Blood pressure 110/70, pulse 84, height 5\' 3"  (1.6 m), weight 127 lb (57.6 kg), last menstrual period 07/19/2017, SpO2 96 %.   General: No apparent distress alert and oriented x3 mood and affect normal, dressed appropriately.  HEENT: Pupils equal, extraocular movements intact  Respiratory: Patient's speak in full sentences and does not appear short of breath  Cardiovascular: No lower extremity edema, non tender, no erythema  Hypermobility noted to multiple joints.  Continues to have some mild discomfort with impingement noted of the right shoulder patient does have some mild synovitis noted of the second and third MCP joints noted today and mild swelling of the hands bilaterally. Patient does have loss of lordosis of the lumbar spine.  Does have tightness with flexion extension of the back.  Mild tightness noted with 07/21/2017 only on the right side today.  Tightness noted with the right range of motion of the hip.  Osteopathic findings  C2 flexed rotated and side bent right C7 flexed rotated and side bent left T3 extended rotated and side bent right inhaled rib T9 extended rotated and side bent left L2 flexed rotated and side bent right Sacrum right on right Pelvic shear noted      Assessment and Plan:  NECK PAIN Continue management.  Chronic problem with mild exacerbation.  Discussed that there is a possibility that the estrogen could  be causing some mild weight gain.  Patient will discuss with her other providers.  Follow-up with me again in 6 to 8 weeks otherwise.  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  Discussed which activities that he needs Avoid.  Patient will continue to work on the core strengthening.  Patient does not want further work-up for any type of exacerbation  of some of the underlying synovitis.  Follow-up with me again in 4 to 6 weeks   Nonallopathic problems  Decision today to treat with OMT was based on Physical Exam  After verbal consent patient was treated with HVLA, ME, FPR techniques in cervical, rib, thoracic, lumbar, and sacral  areas  Patient tolerated the procedure well with improvement in symptoms  Patient given exercises, stretches and lifestyle modifications  See medications in patient instructions if given  Patient will follow up in 4-8 weeks      The above documentation has been reviewed and is accurate and complete Judi Saa, DO       Note: This dictation was prepared with Dragon dictation along with smaller phrase technology. Any transcriptional errors that result from this process are unintentional.

## 2020-09-01 ENCOUNTER — Other Ambulatory Visit: Payer: Self-pay

## 2020-09-01 ENCOUNTER — Ambulatory Visit: Payer: BC Managed Care – PPO | Admitting: Family Medicine

## 2020-09-01 ENCOUNTER — Ambulatory Visit (INDEPENDENT_AMBULATORY_CARE_PROVIDER_SITE_OTHER): Payer: BC Managed Care – PPO | Admitting: Family Medicine

## 2020-09-01 VITALS — BP 110/70 | HR 84 | Ht 63.0 in | Wt 127.0 lb

## 2020-09-01 DIAGNOSIS — M9903 Segmental and somatic dysfunction of lumbar region: Secondary | ICD-10-CM

## 2020-09-01 DIAGNOSIS — M9902 Segmental and somatic dysfunction of thoracic region: Secondary | ICD-10-CM

## 2020-09-01 DIAGNOSIS — M9901 Segmental and somatic dysfunction of cervical region: Secondary | ICD-10-CM

## 2020-09-01 DIAGNOSIS — M9904 Segmental and somatic dysfunction of sacral region: Secondary | ICD-10-CM

## 2020-09-01 DIAGNOSIS — M9908 Segmental and somatic dysfunction of rib cage: Secondary | ICD-10-CM

## 2020-09-01 DIAGNOSIS — M542 Cervicalgia: Secondary | ICD-10-CM

## 2020-09-01 DIAGNOSIS — M533 Sacrococcygeal disorders, not elsewhere classified: Secondary | ICD-10-CM

## 2020-09-01 NOTE — Assessment & Plan Note (Signed)
Chronic problem with mild exacerbation.  Discussed which activities that he needs Avoid.  Patient will continue to work on the core strengthening.  Patient does not want further work-up for any type of exacerbation of some of the underlying synovitis.  Follow-up with me again in 4 to 6 weeks

## 2020-09-01 NOTE — Assessment & Plan Note (Signed)
Continue management.  Chronic problem with mild exacerbation.  Discussed that there is a possibility that the estrogen could be causing some mild weight gain.  Patient will discuss with her other providers.  Follow-up with me again in 6 to 8 weeks otherwise.

## 2020-09-01 NOTE — Patient Instructions (Signed)
I would discuss with your doc about estrogen See me again in 5-7 weeks

## 2020-09-29 DIAGNOSIS — M4316 Spondylolisthesis, lumbar region: Secondary | ICD-10-CM | POA: Diagnosis not present

## 2020-09-29 DIAGNOSIS — M5136 Other intervertebral disc degeneration, lumbar region: Secondary | ICD-10-CM | POA: Diagnosis not present

## 2020-09-29 DIAGNOSIS — M545 Low back pain, unspecified: Secondary | ICD-10-CM | POA: Diagnosis not present

## 2020-09-29 DIAGNOSIS — M5416 Radiculopathy, lumbar region: Secondary | ICD-10-CM | POA: Diagnosis not present

## 2020-10-01 DIAGNOSIS — M9903 Segmental and somatic dysfunction of lumbar region: Secondary | ICD-10-CM | POA: Diagnosis not present

## 2020-10-01 DIAGNOSIS — M9902 Segmental and somatic dysfunction of thoracic region: Secondary | ICD-10-CM | POA: Diagnosis not present

## 2020-10-01 DIAGNOSIS — M5386 Other specified dorsopathies, lumbar region: Secondary | ICD-10-CM | POA: Diagnosis not present

## 2020-10-01 DIAGNOSIS — M9905 Segmental and somatic dysfunction of pelvic region: Secondary | ICD-10-CM | POA: Diagnosis not present

## 2020-10-05 NOTE — Progress Notes (Signed)
[name] [address] [place] [address] Phone: [phone] Subjective:   INadine Counts, am serving as a scribe for Dr. Antoine Primas. This visit occurred during the SARS-CoV-2 public health emergency.  Safety protocols were in place, including screening questions prior to the visit, additional usage of staff PPE, and extensive cleaning of exam room while observing appropriate contact time as indicated for disinfecting solutions.   I'm seeing this patient by the request  of:  Panosh, [name], MD  CC: neck and back pain   [id number]  Terri [name] is a 55 y.o. female coming in with complaint of back and neck pain. OMT on 09/01/2020. Patient states pain is the same. She has a new pain in her left foot like there is a pebble in her shoe. That started like a 2-3 weeks ago. She is getting an MRI for her lumbar back soon.  Medications patient has been prescribed: None           Past Medical History:  Diagnosis Date   Anxiety    Depression    Environmental allergies    GERD (gastroesophageal reflux disease)    Headache(784.0)    Recurrent sinusitis    ent and pulm eval in past.    Allergies  Allergen Reactions   Augmentin [Amoxicillin-Pot Clavulanate] Nausea Only   Celecoxib     REACTION: unspecified   Sulfa Antibiotics    Sulfonamide Derivatives     REACTION: hives, throat swelling   Monistat [Miconazole] Other (See Comments)    Sensitive to topical vaginal  Azoles No side effects with oral Diflucan     Review of Systems:  No headache, visual changes, nausea, vomiting, diarrhea, constipation, dizziness, abdominal pain, skin rash, fevers, chills, night sweats, weight loss, swollen lymph nodes, body aches, joint swelling, chest pain, shortness of breath, mood changes. POSITIVE muscle aches  Objective  Blood pressure 110/68, pulse 66, height 5\' 3"  (1.6 m), weight 124 lb (56.2 kg), last menstrual period 07/19/2017, SpO2 99 %.    General: No apparent distress alert and oriented x3 mood and affect normal, dressed appropriately.  HEENT: Pupils equal, extraocular movements intact  Respiratory: Patient's speak in full sentences and does not appear short of breath  Cardiovascular: No lower extremity edema, non tender, no erythema  Neck exam shows tightness noted that seems to be more on the C7 area on the left side today.  Very mild crepitus noted.  Tightness noted in the parascapular region on the right side of scapular region Back exam shows does have hypermobility noted.  Still some pain in the greater trochanteric area bilaterally. Significant discomfort over the pubic symphysis noted today.  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  Discussed with patient icing regimen and home exercises.  Patient is making some progress.  Does have the meloxicam as needed.  We discussed the vitamin D supplementation as well as the gabapentin.  Patient will continue to work on the ergonomics at work.  Follow-up again in 6 to 8 weeks   Nonallopathic problems  Decision today to treat with OMT was based on Physical Exam  After verbal consent patient was treated  with HVLA, ME, FPR techniques in cervical, rib, thoracic, lumbar, and sacral  areas  Patient tolerated the procedure well with improvement in symptoms  Patient given exercises, stretches and lifestyle modifications  See medications in patient instructions if given  Patient will follow up in 4-8 weeks      The above documentation has been reviewed and is accurate and complete Judi Saa, DO        Note: This dictation was prepared with Dragon  dictation along with smaller phrase technology. Any transcriptional errors that result from this process are unintentional.

## 2020-10-06 ENCOUNTER — Other Ambulatory Visit: Payer: Self-pay

## 2020-10-06 ENCOUNTER — Ambulatory Visit (INDEPENDENT_AMBULATORY_CARE_PROVIDER_SITE_OTHER): Payer: BC Managed Care – PPO | Admitting: Family Medicine

## 2020-10-06 VITALS — BP 110/68 | HR 66 | Ht 63.0 in | Wt 124.0 lb

## 2020-10-06 DIAGNOSIS — M9903 Segmental and somatic dysfunction of lumbar region: Secondary | ICD-10-CM | POA: Diagnosis not present

## 2020-10-06 DIAGNOSIS — M533 Sacrococcygeal disorders, not elsewhere classified: Secondary | ICD-10-CM

## 2020-10-06 DIAGNOSIS — M9902 Segmental and somatic dysfunction of thoracic region: Secondary | ICD-10-CM

## 2020-10-06 DIAGNOSIS — M9905 Segmental and somatic dysfunction of pelvic region: Secondary | ICD-10-CM | POA: Diagnosis not present

## 2020-10-06 DIAGNOSIS — M9904 Segmental and somatic dysfunction of sacral region: Secondary | ICD-10-CM

## 2020-10-06 DIAGNOSIS — M9901 Segmental and somatic dysfunction of cervical region: Secondary | ICD-10-CM

## 2020-10-06 DIAGNOSIS — M940 Chondrocostal junction syndrome [Tietze]: Secondary | ICD-10-CM

## 2020-10-06 DIAGNOSIS — M9908 Segmental and somatic dysfunction of rib cage: Secondary | ICD-10-CM

## 2020-10-06 NOTE — Patient Instructions (Addendum)
[facility] [phone] Call Today  Good to see you! Carbon fiber plate for shoe Oofos in the house Send message in MyChart when get MRI See you again in 6-8 weeks

## 2020-10-07 ENCOUNTER — Encounter: Payer: Self-pay | Admitting: Family Medicine

## 2020-10-07 NOTE — Assessment & Plan Note (Signed)
Chronic condition with exacerbation.  Discussed with patient icing regimen and home exercises.  Patient is making some progress.  Does have the meloxicam as needed.  We discussed the vitamin D supplementation as well as the gabapentin.  Patient will continue to work on the ergonomics at work.  Follow-up again in 6 to 8 weeks

## 2020-10-07 NOTE — Assessment & Plan Note (Signed)
Sacroiliac dysfunction noted.  Discussed posture ergonomics.  Discussed hip abductor strengthening.  Follow-up again in 6 weeks.  Continuing to have pelvic shear noted we will need to monitor.

## 2020-10-11 ENCOUNTER — Telehealth: Payer: Self-pay | Admitting: Internal Medicine

## 2020-10-11 MED ORDER — METHYLPHENIDATE HCL ER (OSM) 36 MG PO TBCR: 36.0000 mg | EXTENDED_RELEASE_TABLET | Freq: Every day | ORAL | 0 refills | Status: DC

## 2020-10-11 NOTE — Telephone Encounter (Signed)
Needs appt before next refill 

## 2020-10-11 NOTE — Telephone Encounter (Signed)
Patient called asking for a refill of methylphenidate (CONCERTA) 36 MG PO CR tablet (Expired) to be sent to [id number] - [place], [place] - [address] AT [address] OF [facility] [address].  Please advise.

## 2020-10-12 DIAGNOSIS — M5386 Other specified dorsopathies, lumbar region: Secondary | ICD-10-CM | POA: Diagnosis not present

## 2020-10-12 DIAGNOSIS — M9902 Segmental and somatic dysfunction of thoracic region: Secondary | ICD-10-CM | POA: Diagnosis not present

## 2020-10-12 DIAGNOSIS — M9903 Segmental and somatic dysfunction of lumbar region: Secondary | ICD-10-CM | POA: Diagnosis not present

## 2020-10-12 DIAGNOSIS — M9905 Segmental and somatic dysfunction of pelvic region: Secondary | ICD-10-CM | POA: Diagnosis not present

## 2020-10-12 NOTE — Telephone Encounter (Signed)
Noted the pt has been informed to schedule an OV before her next refill.

## 2020-10-21 DIAGNOSIS — M5386 Other specified dorsopathies, lumbar region: Secondary | ICD-10-CM | POA: Diagnosis not present

## 2020-10-21 DIAGNOSIS — M9905 Segmental and somatic dysfunction of pelvic region: Secondary | ICD-10-CM | POA: Diagnosis not present

## 2020-10-21 DIAGNOSIS — M9902 Segmental and somatic dysfunction of thoracic region: Secondary | ICD-10-CM | POA: Diagnosis not present

## 2020-10-21 DIAGNOSIS — M9903 Segmental and somatic dysfunction of lumbar region: Secondary | ICD-10-CM | POA: Diagnosis not present

## 2020-10-22 DIAGNOSIS — M5126 Other intervertebral disc displacement, lumbar region: Secondary | ICD-10-CM | POA: Diagnosis not present

## 2020-10-22 DIAGNOSIS — M48061 Spinal stenosis, lumbar region without neurogenic claudication: Secondary | ICD-10-CM | POA: Diagnosis not present

## 2020-10-22 DIAGNOSIS — M47816 Spondylosis without myelopathy or radiculopathy, lumbar region: Secondary | ICD-10-CM | POA: Diagnosis not present

## 2020-10-22 DIAGNOSIS — M4316 Spondylolisthesis, lumbar region: Secondary | ICD-10-CM | POA: Diagnosis not present

## 2020-11-01 DIAGNOSIS — J019 Acute sinusitis, unspecified: Secondary | ICD-10-CM | POA: Diagnosis not present

## 2020-11-05 DIAGNOSIS — M4316 Spondylolisthesis, lumbar region: Secondary | ICD-10-CM | POA: Diagnosis not present

## 2020-11-05 DIAGNOSIS — M5416 Radiculopathy, lumbar region: Secondary | ICD-10-CM | POA: Diagnosis not present

## 2020-11-05 DIAGNOSIS — M5136 Other intervertebral disc degeneration, lumbar region: Secondary | ICD-10-CM | POA: Diagnosis not present

## 2020-11-12 ENCOUNTER — Other Ambulatory Visit: Payer: Self-pay | Admitting: Internal Medicine

## 2020-11-16 ENCOUNTER — Telehealth: Payer: Self-pay | Admitting: Internal Medicine

## 2020-11-16 ENCOUNTER — Ambulatory Visit: Payer: BC Managed Care – PPO | Admitting: Family Medicine

## 2020-11-16 ENCOUNTER — Telehealth: Payer: Self-pay | Admitting: Family Medicine

## 2020-11-16 NOTE — Telephone Encounter (Signed)
Pt is calling and needs a refill on generic concerta 36 mg send to  [id number] - [place], [place] - [address] AT [facility] [address] [facility] [address] Phone:  [phone]  Fax:  [phone]

## 2020-11-16 NOTE — Telephone Encounter (Signed)
Pt appt today was cancelled. She had wanted to go over her MRI from [facility] ( ordered by another provider ). MRI disc in your folder up front. Here are her concerns, advised we would respond via MyChart.  She is seeing Dr. Ret[facility] He is a Biochemist, clinical and will be discussing treatment options. She wanted your opinion on the MRI and continuing OMT. She was informed Dr. Jean Rosenthal would be performing this depending on the timeline.

## 2020-11-18 DIAGNOSIS — M5416 Radiculopathy, lumbar region: Secondary | ICD-10-CM | POA: Diagnosis not present

## 2020-11-18 DIAGNOSIS — M5136 Other intervertebral disc degeneration, lumbar region: Secondary | ICD-10-CM | POA: Diagnosis not present

## 2020-11-18 DIAGNOSIS — M4316 Spondylolisthesis, lumbar region: Secondary | ICD-10-CM | POA: Diagnosis not present

## 2020-11-18 DIAGNOSIS — H40051 Ocular hypertension, right eye: Secondary | ICD-10-CM | POA: Diagnosis not present

## 2020-11-18 MED ORDER — METHYLPHENIDATE HCL ER (OSM) 36 MG PO TBCR: 36.0000 mg | EXTENDED_RELEASE_TABLET | Freq: Every day | ORAL | 0 refills | Status: DC

## 2020-11-30 ENCOUNTER — Encounter: Payer: Self-pay | Admitting: Internal Medicine

## 2020-11-30 ENCOUNTER — Other Ambulatory Visit: Payer: Self-pay

## 2020-11-30 ENCOUNTER — Ambulatory Visit (INDEPENDENT_AMBULATORY_CARE_PROVIDER_SITE_OTHER): Payer: BC Managed Care – PPO | Admitting: Internal Medicine

## 2020-11-30 ENCOUNTER — Ambulatory Visit (INDEPENDENT_AMBULATORY_CARE_PROVIDER_SITE_OTHER): Payer: BC Managed Care – PPO

## 2020-11-30 VITALS — BP 122/80 | HR 80 | Temp 98.4°F | Ht 63.0 in | Wt 123.6 lb

## 2020-11-30 DIAGNOSIS — Z79899 Other long term (current) drug therapy: Secondary | ICD-10-CM

## 2020-11-30 DIAGNOSIS — J328 Other chronic sinusitis: Secondary | ICD-10-CM

## 2020-11-30 DIAGNOSIS — F909 Attention-deficit hyperactivity disorder, unspecified type: Secondary | ICD-10-CM | POA: Diagnosis not present

## 2020-11-30 DIAGNOSIS — R053 Chronic cough: Secondary | ICD-10-CM

## 2020-11-30 DIAGNOSIS — R059 Cough, unspecified: Secondary | ICD-10-CM | POA: Diagnosis not present

## 2020-11-30 MED ORDER — METHYLPHENIDATE HCL ER (OSM) 36 MG PO TBCR: 36.0000 mg | EXTENDED_RELEASE_TABLET | Freq: Every day | ORAL | 0 refills | Status: DC

## 2020-11-30 MED ORDER — DOXYCYCLINE HYCLATE 100 MG PO TABS: 100.0000 mg | ORAL_TABLET | Freq: Two times a day (BID) | ORAL | 0 refills | Status: DC

## 2020-11-30 NOTE — Progress Notes (Signed)
Chief Complaint  Patient presents with   Cough    Patient complains of cough, x1 month, Tried cough syrup with little relief   Conjunctivitis    Patient complains of conjunctivitis, Tried Cefdinir with little relief, Patient reports green discharge in morning,    Sinusitis    Patient complains of sinusitis,     HPI: Terri [name] 55 y.o. come in for Chronic disease management ADHD and ongoing 1 month of respiratory symptoms predominant cough and upper respiratory congestion Initially feels got a viral respiratory infection from 57 year old.  About a month ago  And seen [facility].  Bad cough and was given  omnicef for possible sinusitis and no help.  Neg rsv and covid negative.  Throat clearing cough eye got red and really red swelling.    Green discharge.  Often then came back Now left crusted  and  hurt a bit .  Achy like muscle pain;  stopped  contacts for 3 weeks . Red cough and congestion comes and goes. Still is quite hoarse taking antihistamine but is off her Flonase. Does have history of allergy to mold gets dermatographia Has changed her make-up is cautious.  ADHD: Concerta is really quite helpful for her when she does not take it hard to focus and get things done denies mood issues taking Wellbutrin which is really quite helpful and recently has been placed on hormone therapy to help the perimenopausal symptoms. Feels that she is in a good place would like to continue medication. Working 45-50+ hours a week.   ROS: See pertinent positives and negatives per HPI.  Past Medical History:  Diagnosis Date   Anxiety    Depression    Environmental allergies    GERD (gastroesophageal reflux disease)    Headache(784.0)    Recurrent sinusitis    ent and pulm eval in past.    Family History  Problem Relation Age of Onset   Heart disease Other        fhx    Social History   Socioeconomic History   Marital status: Single    Spouse name: Not on file   Number of children: Not  on file   Years of education: Not on file   Highest education level: Not on file  Occupational History   Not on file  Tobacco Use   Smoking status: Never   Smokeless tobacco: Never  Vaping Use   Vaping Use: Never used  Substance and Sexual Activity   Alcohol use: Yes    Comment: Occasional use   Drug use: No   Sexual activity: Yes    Birth control/protection: Pill  Other Topics Concern   Not on file  Social History Narrative   esthetician   Non smoker   HH of 1    Social Determinants of Health   Financial Resource Strain: Not on file  Food Insecurity: Not on file  Transportation Needs: Not on file  Physical Activity: Not on file  Stress: Not on file  Social Connections: Not on file    Outpatient Medications Prior to Visit  Medication Sig Dispense Refill   ALPRAZolam (XANAX) 0.25 MG tablet Take 1 tablet (0.25 mg total) by mouth 3 (three) times daily as needed for anxiety (panic). 20 tablet 0   buPROPion (WELLBUTRIN XL) 300 MG 24 hr tablet TAKE 1 TABLET BY MOUTH EVERY DAY 90 tablet 0   estradiol (CLIMARA - DOSED IN MG/24 HR) 0.025 mg/24hr patch Place 0.025 mg onto the skin  once a week.     fish oil-omega-3 fatty acids 1000 MG capsule Take 1 g by mouth daily.     fluconazole (DIFLUCAN) 150 MG tablet 1 pill today then repeat in 4 days 2 tablet 0   fluticasone (FLONASE) 50 MCG/ACT nasal spray SPRAY 2 SPRAYS INTO EACH NOSTRIL EVERY DAY 16 g 11   gabapentin (NEURONTIN) 100 MG capsule TAKE 2 CAPSULES BY MOUTH EVERY DAY AT BEDTIME 60 capsule 3   Hydrocortisone Butyrate 0.1 % OINT hydrocortisone butyrate 0.1 % topical ointment     Ivermectin (SOOLANTRA) 1 % CREA Soolantra 1 % topical cream     LYSINE PO Take 1,000 mg by mouth daily. for fever blisters.     meloxicam (MOBIC) 15 MG tablet as needed.      Norethin-Eth Estrad-Fe Biphas (LO LOESTRIN FE PO) Take by mouth.     Nutritional Supplements (DHEA PO) Take by mouth.      progesterone (PROMETRIUM) 200 MG capsule Take 200 mg by  mouth daily.     spironolactone (ALDACTONE) 25 MG tablet Take 25 mg by mouth daily.     SUMAtriptan (IMITREX) 100 MG tablet Take 1 at onset of  Migraine headache   and can repeat in 2 hours . 9 tablet 1   valACYclovir (VALTREX) 1000 MG tablet Take 1 tablet (1,000 mg total) by mouth 2 (two) times daily. As direted 180 tablet 1   Vitamin D, Ergocalciferol, (DRISDOL) 50000 units CAPS capsule TAKE 1 CAPSULE EVERY 7 DAYS 12 capsule 0   methylphenidate (CONCERTA) 36 MG PO CR tablet Take 1 tablet (36 mg total) by mouth daily. Genericneeds appt before further refill s 30 tablet 0   nitrofurantoin, macrocrystal-monohydrate, (MACROBID) 100 MG capsule Take 1 capsule (100 mg total) by mouth 2 (two) times daily. 14 capsule 0   pregabalin (LYRICA) 50 MG capsule Take 50 mg by mouth 2 (two) times daily.     No facility-administered medications prior to visit.     EXAM:  BP 122/80 (BP Location: Left Arm, Patient Position: Sitting, Cuff Size: Normal)   Pulse 80   Temp 98.4 F (36.9 C) (Oral)   Ht 5\' 3"  (1.6 m)   Wt 123 lb 9.6 oz (56.1 kg)   LMP 07/19/2017 (Approximate)   SpO2 96%   BMI 21.89 kg/m   Body mass index is 21.89 kg/m.  GENERAL: vitals reviewed and listed above, alert, oriented, appears well hydrated and in no acute distress HEENT: atraumatic, conjunctiva  clear, no obvious abnormalities on inspection of external nose and ears appears congested mild tenderness left paranasal maxillary area without edema OP : Masked right TM clear left EAC soft wax  NECK: no obvious masses on inspection palpation  LUNGS: clear to auscultation bilaterally, no wheezes, rales or rhonchi, good air movement CV: HRRR, no clubbing cyanosis or  peripheral edema nl cap refill  MS: moves all extremities without noticeable focal  abnormality PSYCH: pleasant and cooperative, no obvious depression or anxiety Lab Results  Component Value Date   WBC 6.1 03/08/2020   HGB 13.7 03/08/2020   HCT 40.6 03/08/2020   PLT  249.0 03/08/2020   GLUCOSE 92 03/08/2020   CHOL 219 (H) 03/08/2020   TRIG 66.0 03/08/2020   HDL 103.30 03/08/2020   LDLCALC 102 (H) 03/08/2020   ALT 37 (H) 03/08/2020   AST 27 03/08/2020   NA 137 03/08/2020   K 3.9 03/08/2020   CL 102 03/08/2020   CREATININE 0.80 03/08/2020   BUN 9 03/08/2020  CO2 28 03/08/2020   TSH 1.09 03/08/2020   BP Readings from Last 3 Encounters:  11/30/20 122/80  10/06/20 110/68  09/01/20 110/70    ASSESSMENT AND PLAN:  Discussed the following assessment and plan:  Cough, persistent - Plan: DG Chest 2 View  Other chronic sinusitis - Subacute triggered by viral infection relapsing possibly mild allergic component but localizing on left face.  Attention deficit hyperactivity disorder (ADHD), unspecified ADHD type  Medication management Continue Concerta benefit more than risk we will refill today Plan 37-month med recheck. Evaluation respiratory check chest x-ray add antibiotic if needing get back on the Flonase. -Patient advised to return or notify health care team  if  new concerns arise.  Patient Instructions  Get back on flonase.  Saline.   Chest x ray to r/o surprises .  This acts like viral resp infection ith secondary sinus infection  and  consider adding back antibiotic if left sinus  persisting.   Consider underlying allergy   trigger also .     [name]. Avital Dancy M.D.

## 2020-11-30 NOTE — Patient Instructions (Addendum)
Get back on flonase.  Saline.   Chest x ray to r/o surprises .  This acts like viral resp infection ith secondary sinus infection  and  consider adding back antibiotic if left sinus  persisting.   Consider underlying allergy   trigger also .

## 2020-12-01 NOTE — Progress Notes (Signed)
Chest x ray is normal   reassuring

## 2020-12-06 DIAGNOSIS — M5416 Radiculopathy, lumbar region: Secondary | ICD-10-CM | POA: Diagnosis not present

## 2020-12-07 DIAGNOSIS — M4310 Spondylolisthesis, site unspecified: Secondary | ICD-10-CM | POA: Insufficient documentation

## 2020-12-07 DIAGNOSIS — Z6822 Body mass index (BMI) 22.0-22.9, adult: Secondary | ICD-10-CM | POA: Diagnosis not present

## 2020-12-07 DIAGNOSIS — Z01419 Encounter for gynecological examination (general) (routine) without abnormal findings: Secondary | ICD-10-CM | POA: Diagnosis not present

## 2020-12-23 DIAGNOSIS — M4316 Spondylolisthesis, lumbar region: Secondary | ICD-10-CM | POA: Diagnosis not present

## 2020-12-23 DIAGNOSIS — M5136 Other intervertebral disc degeneration, lumbar region: Secondary | ICD-10-CM | POA: Diagnosis not present

## 2020-12-23 DIAGNOSIS — M5416 Radiculopathy, lumbar region: Secondary | ICD-10-CM | POA: Diagnosis not present

## 2020-12-27 ENCOUNTER — Telehealth: Payer: Self-pay | Admitting: Internal Medicine

## 2020-12-27 MED ORDER — METHYLPHENIDATE HCL ER (OSM) 36 MG PO TBCR: 36.0000 mg | EXTENDED_RELEASE_TABLET | Freq: Every day | ORAL | 0 refills | Status: DC

## 2020-12-27 NOTE — Telephone Encounter (Signed)
Done

## 2020-12-27 NOTE — Telephone Encounter (Signed)
Patient is requesting for a refill for methylphenidate (CONCERTA) 36 MG PO CR tablet [[id number]]  to be sent to her pharmacy.  Patient could be contacted at [phone].  Please advise.

## 2021-01-03 DIAGNOSIS — M9902 Segmental and somatic dysfunction of thoracic region: Secondary | ICD-10-CM | POA: Diagnosis not present

## 2021-01-03 DIAGNOSIS — M9905 Segmental and somatic dysfunction of pelvic region: Secondary | ICD-10-CM | POA: Diagnosis not present

## 2021-01-03 DIAGNOSIS — M9903 Segmental and somatic dysfunction of lumbar region: Secondary | ICD-10-CM | POA: Diagnosis not present

## 2021-01-03 DIAGNOSIS — M5386 Other specified dorsopathies, lumbar region: Secondary | ICD-10-CM | POA: Diagnosis not present

## 2021-02-07 ENCOUNTER — Telehealth: Payer: Self-pay | Admitting: Internal Medicine

## 2021-02-07 NOTE — Telephone Encounter (Signed)
Pt call and stated she need a refill on methylphenidate (CONCERTA) 36 MG PO CR tablet sent to  [id number] - [place], [place] - [address] AT [address] Phone: [phone]  Fax: [phone]     

## 2021-02-08 NOTE — Telephone Encounter (Signed)
Last fill date 12/27/20 Last office visit 11/30/20  Ok to refill?

## 2021-02-10 DIAGNOSIS — M9903 Segmental and somatic dysfunction of lumbar region: Secondary | ICD-10-CM | POA: Diagnosis not present

## 2021-02-10 DIAGNOSIS — M5386 Other specified dorsopathies, lumbar region: Secondary | ICD-10-CM | POA: Diagnosis not present

## 2021-02-10 DIAGNOSIS — M9902 Segmental and somatic dysfunction of thoracic region: Secondary | ICD-10-CM | POA: Diagnosis not present

## 2021-02-10 DIAGNOSIS — M9905 Segmental and somatic dysfunction of pelvic region: Secondary | ICD-10-CM | POA: Diagnosis not present

## 2021-02-10 NOTE — Telephone Encounter (Signed)
[place] is going out of [place] and would like to have medication today

## 2021-02-12 ENCOUNTER — Other Ambulatory Visit: Payer: Self-pay | Admitting: Internal Medicine

## 2021-02-14 ENCOUNTER — Other Ambulatory Visit: Payer: Self-pay | Admitting: Internal Medicine

## 2021-02-14 MED ORDER — METHYLPHENIDATE HCL ER (OSM) 36 MG PO TBCR: 36.0000 mg | EXTENDED_RELEASE_TABLET | Freq: Every day | ORAL | 0 refills | Status: DC

## 2021-02-14 NOTE — Telephone Encounter (Signed)
Last fill date 12/27/20 °Last office visit 11/30/20 ° Ok to refill? °

## 2021-02-14 NOTE — Telephone Encounter (Signed)
I just received this message that was 50 days old.  I will send in the refill.

## 2021-02-16 DIAGNOSIS — M9905 Segmental and somatic dysfunction of pelvic region: Secondary | ICD-10-CM | POA: Diagnosis not present

## 2021-02-16 DIAGNOSIS — M9903 Segmental and somatic dysfunction of lumbar region: Secondary | ICD-10-CM | POA: Diagnosis not present

## 2021-02-16 DIAGNOSIS — M5386 Other specified dorsopathies, lumbar region: Secondary | ICD-10-CM | POA: Diagnosis not present

## 2021-02-16 DIAGNOSIS — M9902 Segmental and somatic dysfunction of thoracic region: Secondary | ICD-10-CM | POA: Diagnosis not present

## 2021-02-21 DIAGNOSIS — H903 Sensorineural hearing loss, bilateral: Secondary | ICD-10-CM | POA: Diagnosis not present

## 2021-02-21 DIAGNOSIS — H608X2 Other otitis externa, left ear: Secondary | ICD-10-CM | POA: Diagnosis not present

## 2021-02-21 DIAGNOSIS — H6122 Impacted cerumen, left ear: Secondary | ICD-10-CM | POA: Diagnosis not present

## 2021-03-02 NOTE — Progress Notes (Signed)
[name] [address] [place] [address] Phone: [phone] Subjective:   [name], am serving as a scribe for Dr. Antoine Primas.  This visit occurred during the SARS-CoV-2 public health emergency.  Safety protocols were in place, including screening questions prior to the visit, additional usage of staff PPE, and extensive cleaning of exam room while observing appropriate contact time as indicated for disinfecting solutions.    I'm seeing this patient by the request  of:  Panosh, [name], MD  CC: Back pain, foot pain  WER:[id number]  Dorann Davidson is a 56 y.o. female coming in with complaint of back and neck pain. OMT on 10/06/2020. Patient states foot pain as well. MRI and epidural injections in L4/L5 since last visit. Have helped with her back pain. Now having L scapula pain since December. Has seen chiro. Notes tenderness in muscle. Inspiration and rotation are painful. Also tried Lyrica but she discontinued due to feeling drowsy. Helped her back pain but not foot pain.   Also complaining of L foot pain since July or August. Pain over mdifoot. Was stepped on with a high heel. Notes a bone that sticks outs. Started to also feel pain in bottom of foot over ball of foot. 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 Patient is having no relief from any therapies she has tried. Using inserts, felt pads, accupuncture. Wearing OOFOS recovery sandals but this is not helping. Sandals made pain worse.   Medications patient has been prescribed: None  Taking:         Reviewed prior external information including notes and imaging from previsou exam, outside providers and external EMR if available.   As well as notes that were available from care everywhere and other healthcare systems.  Past medical history, social, surgical and family history all reviewed in electronic medical record.  No pertanent information unless stated regarding to the chief  complaint.   Past Medical History:  Diagnosis Date   Anxiety    Depression    Environmental allergies    GERD (gastroesophageal reflux disease)    Headache(784.0)    Recurrent sinusitis    ent and pulm eval in past.    Allergies  Allergen Reactions   Augmentin [Amoxicillin-Pot Clavulanate] Nausea Only   Celecoxib     REACTION: unspecified   Sulfa Antibiotics    Sulfonamide Derivatives     REACTION: hives, throat swelling   Monistat [Miconazole] Other (See Comments)    Sensitive to topical vaginal  Azoles No side effects with oral Diflucan     Review of Systems:  No headache, visual changes, nausea, vomiting, diarrhea, constipation, dizziness, abdominal pain, skin rash, fevers, chills, night sweats, weight loss, swollen lymph nodes, joint swelling, chest pain, shortness of breath, mood changes. POSITIVE muscle aches, body aches  Objective  Blood pressure 128/78, pulse 86, height 5\' 3"  (1.6 m), weight 120 lb (54.4 kg), last menstrual period 07/19/2017, SpO2 98 %.   General: No apparent distress alert and oriented x3 mood and affect normal, dressed appropriately.  HEENT: Pupils equal, extraocular movements intact  Respiratory: Patient's speak in full sentences and does not appear short of breath  Cardiovascular: No lower extremity edema, non tender, no erythema  Hypermobility noted.  Included in the exam does have significant breakdown of the transverse arch with bunion and bunionette formation.  Patient also has some mild decrease arch in the longitudinal.  Mild overpronation of the hindfoot. Foot does have a positive  squeeze test noted.  Tender to palpation on the plantar aspect of the second toe. Patient does have tenderness over the scapular region on the left side.  Likely slipped rib.  Neck exam tenderness to palpation diffusely.  Difficulty with sidebending bilaterally   Osteopathic findings  C2 flexed rotated and side bent right C6 flexed rotated and side bent left T8  extended rotated and side bent left inhaled rib L2 flexed rotated and side bent right Sacrum right on right       Assessment and Plan:  Benign hypermobility syndrome Chronic, with exacerbation again.  And still has a positive ANA to think that further evaluation with rheumatology would be beneficial.  Patient will look up who her sister sees and decide if she would like a referral.  Patient is responding still well to osteopathic manipulation.  Discussed icing regimen and home exercises.  Discussed which activities to do and which ones to avoid.  Significant changes otherwise.  Follow-up again in 6 to  NECK PAIN Tightness noted, seems to be more ergonomic and related to certain positioning more than truly anything else.  Follow-up again in 6 to 8 weeks.  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  Responded well to manipulation.  Discussed posture and antibiotics.  Patient was put on Lyrica by another provider.  Not taking it regularly.  Abnormality of gait Significant breakdown of the transverse arch.  This could cause similar bursitis when it appears that goes on the plantar aspect of the foot as well as a neuroma between the third and fourth metatarsals.  Patient would like to try custom orthotics and will be referred accordingly.  If no improvement do think we need to consider the possibility of injection.   Nonallopathic problems  Decision today to treat with OMT was based on Physical Exam  After verbal consent patient was treated with HVLA, ME, FPR techniques in cervical, rib, thoracic, lumbar, and sacral  areas  Patient tolerated the procedure well with improvement in symptoms  Patient given exercises, stretches and lifestyle modifications  See medications in patient instructions if given  Patient will follow up in 4-8 weeks      The above documentation has been reviewed and is accurate and complete Judi Saa, DO        Note: This dictation was prepared  with Dragon dictation along with smaller phrase technology. Any transcriptional errors that result from this process are unintentional.

## 2021-03-03 ENCOUNTER — Ambulatory Visit: Payer: Self-pay

## 2021-03-03 ENCOUNTER — Other Ambulatory Visit: Payer: Self-pay

## 2021-03-03 ENCOUNTER — Encounter: Payer: Self-pay | Admitting: Family Medicine

## 2021-03-03 ENCOUNTER — Ambulatory Visit: Payer: BC Managed Care – PPO | Admitting: Family Medicine

## 2021-03-03 VITALS — BP 128/78 | HR 86 | Ht 63.0 in | Wt 120.0 lb

## 2021-03-03 DIAGNOSIS — M9901 Segmental and somatic dysfunction of cervical region: Secondary | ICD-10-CM

## 2021-03-03 DIAGNOSIS — M940 Chondrocostal junction syndrome [Tietze]: Secondary | ICD-10-CM | POA: Diagnosis not present

## 2021-03-03 DIAGNOSIS — M9908 Segmental and somatic dysfunction of rib cage: Secondary | ICD-10-CM

## 2021-03-03 DIAGNOSIS — M542 Cervicalgia: Secondary | ICD-10-CM

## 2021-03-03 DIAGNOSIS — M9903 Segmental and somatic dysfunction of lumbar region: Secondary | ICD-10-CM

## 2021-03-03 DIAGNOSIS — M79672 Pain in left foot: Secondary | ICD-10-CM

## 2021-03-03 DIAGNOSIS — M9902 Segmental and somatic dysfunction of thoracic region: Secondary | ICD-10-CM

## 2021-03-03 DIAGNOSIS — M9904 Segmental and somatic dysfunction of sacral region: Secondary | ICD-10-CM

## 2021-03-03 DIAGNOSIS — M357 Hypermobility syndrome: Secondary | ICD-10-CM

## 2021-03-03 DIAGNOSIS — R269 Unspecified abnormalities of gait and mobility: Secondary | ICD-10-CM

## 2021-03-03 NOTE — Assessment & Plan Note (Signed)
Was on left side today.  Responded well to manipulation.  Discussed posture and antibiotics.  Patient was put on Lyrica by another provider.  Not taking it regularly.

## 2021-03-03 NOTE — Assessment & Plan Note (Signed)
Tightness noted, seems to be more ergonomic and related to certain positioning more than truly anything else.  Follow-up again in 6 to 8 weeks.

## 2021-03-03 NOTE — Assessment & Plan Note (Signed)
Significant breakdown of the transverse arch.  This could cause similar bursitis when it appears that goes on the plantar aspect of the foot as well as a neuroma between the third and fourth metatarsals.  Patient would like to try custom orthotics and will be referred accordingly.  If no improvement do think we need to consider the possibility of injection.

## 2021-03-03 NOTE — Patient Instructions (Signed)
Good to see you Orthotics  Rib giving you trouble See me again in 6 weeks

## 2021-03-03 NOTE — Assessment & Plan Note (Signed)
Chronic, with exacerbation again.  And still has a positive ANA to think that further evaluation with rheumatology would be beneficial.  Patient will look up who her sister sees and decide if she would like a referral.  Patient is responding still well to osteopathic manipulation.  Discussed icing regimen and home exercises.  Discussed which activities to do and which ones to avoid.  Significant changes otherwise.  Follow-up again in 6 to

## 2021-03-08 DIAGNOSIS — M5386 Other specified dorsopathies, lumbar region: Secondary | ICD-10-CM | POA: Diagnosis not present

## 2021-03-08 DIAGNOSIS — M9908 Segmental and somatic dysfunction of rib cage: Secondary | ICD-10-CM | POA: Diagnosis not present

## 2021-03-08 DIAGNOSIS — M9903 Segmental and somatic dysfunction of lumbar region: Secondary | ICD-10-CM | POA: Diagnosis not present

## 2021-03-08 DIAGNOSIS — M9902 Segmental and somatic dysfunction of thoracic region: Secondary | ICD-10-CM | POA: Diagnosis not present

## 2021-03-08 DIAGNOSIS — M9905 Segmental and somatic dysfunction of pelvic region: Secondary | ICD-10-CM | POA: Diagnosis not present

## 2021-03-11 ENCOUNTER — Ambulatory Visit: Payer: BC Managed Care – PPO | Admitting: Family Medicine

## 2021-03-11 VITALS — BP 110/70 | Ht 63.0 in | Wt 117.0 lb

## 2021-03-11 DIAGNOSIS — R269 Unspecified abnormalities of gait and mobility: Secondary | ICD-10-CM

## 2021-03-11 NOTE — Progress Notes (Signed)
° °  Terri [name] is a 56 y.o. female who presents to [facility] today for the following:  Bilateral Foot Pain Presents for orthotics  Referred by Dr. Katrinka Blazing Has been having left foot pain on plantar aspect of 2nd and 3rd MT heads Had US showing neruoma Has also had prior neuroma on Right, but not bothering her currently Wears MT pads on her own  PMH reviewed.  ROS as above. Medications reviewed.  Exam:  BP 110/70    Ht 5\' 3"  (1.6 m)    Wt 117 lb (53.1 kg)    LMP 07/19/2017 (Approximate)    BMI 20.73 kg/m  Gen: Well NAD MSK:  Bilateral Feet: Inspection:  Right Morton's Foot, hallux valgus b/l with prominent 1st MTP b/l, b/l collapse of transverse arch, no hallux rigidus, good longitudinal arch Palpation: No tenderness to palpation b/l ROM: Full  ROM of the ankle b/l. Normal midfoot flexibility b/l Strength: 5/5 strength ankle in all planes b/l Neurovascular: N/V intact distally in the lower extremity b/l   No results found.   Assessment and Plan: 1) Abnormality of gait Patient was fitted for a : standard, cushioned, semi-rigid orthotic. The orthotic was heated and afterward the patient stood on the orthotic blank positioned on the orthotic stand. The patient was positioned in subtalar neutral position and 10 degrees of ankle dorsiflexion in a weight bearing stance. After completion of molding, a stable base was applied to the orthotic blank. The blank was ground to a stable position for weight bearing. Size: 6 Base: blue EVA Posting: left 5th ray post Additional orthotic padding: R MT pad, L MT cookie  Total time spent with the patient was 30 minutes with greater than 50% of the time spent in face-to-face consultation discussing orthotic construction, instruction, and sitting. Gait was neutral with orthotics in place. Patient found them to be comfortable. Follow-up as needed.   07/21/2017, D.O.  PGY-4 [facility]  03/11/2021 11:39 AM

## 2021-03-11 NOTE — Assessment & Plan Note (Signed)
Patient was fitted for a : standard, cushioned, semi-rigid orthotic. The orthotic was heated and afterward the patient stood on the orthotic blank positioned on the orthotic stand. The patient was positioned in subtalar neutral position and 10 degrees of ankle dorsiflexion in a weight bearing stance. After completion of molding, a stable base was applied to the orthotic blank. The blank was ground to a stable position for weight bearing. Size: 6 Base: blue EVA Posting: left 5th ray post Additional orthotic padding: R MT pad, L MT cookie  Total time spent with the patient was 30 minutes with greater than 50% of the time spent in face-to-face consultation discussing orthotic construction, instruction, and sitting. Gait was neutral with orthotics in place. Patient found them to be comfortable. Follow-up as needed.

## 2021-03-14 NOTE — Progress Notes (Signed)
[facility]: Attending Note: I have reviewed the chart, discussed wit the Sports Medicine Fellow. I agree with assessment and treatment plan as detailed in the Fellow's note.  

## 2021-04-06 DIAGNOSIS — M9905 Segmental and somatic dysfunction of pelvic region: Secondary | ICD-10-CM | POA: Diagnosis not present

## 2021-04-06 DIAGNOSIS — M9902 Segmental and somatic dysfunction of thoracic region: Secondary | ICD-10-CM | POA: Diagnosis not present

## 2021-04-06 DIAGNOSIS — M9903 Segmental and somatic dysfunction of lumbar region: Secondary | ICD-10-CM | POA: Diagnosis not present

## 2021-04-06 DIAGNOSIS — M5386 Other specified dorsopathies, lumbar region: Secondary | ICD-10-CM | POA: Diagnosis not present

## 2021-04-13 NOTE — Progress Notes (Signed)
?Terri [name] D.O. ?[facility] ?[address] [place] [address] ?Phone: [phone] ?Subjective:   ?I, Nadine Counts, am serving as a Neurosurgeon for Dr. Antoine Primas. ?This visit occurred during the SARS-CoV-2 public health emergency.  Safety protocols were in place, including screening questions prior to the visit, additional usage of staff PPE, and extensive cleaning of exam room while observing appropriate contact time as indicated for disinfecting solutions.  ? ?I'm seeing this patient by the request  of:  Panosh, [name], MD ? ?CC: foot pain and back pain follow up  ? ?[id number]  ?Terri [name] is a 56 y.o. female coming in with complaint of back and neck pain. OMT 03/03/2021. Also f/u for L foot pain. Orthotics made on 03/11/2021. Patient states foot is not doing well. Right is manageable since taking orthotic out. Left is worse. She brought the orthotics with her. Rib is a little better. Here for OMT as well. ? ?Medications patient has been prescribed: None ? ?Taking: ? ? ?  ? ? ? ? ?Reviewed prior external information including notes and imaging from previsou exam, outside providers and external EMR if available.  ? ?As well as notes that were available from care everywhere and other healthcare systems. ? ?Past medical history, social, surgical and family history all reviewed in electronic medical record.  No pertanent information unless stated regarding to the chief complaint.  ? ?Past Medical History:  ?Diagnosis Date  ? Anxiety   ? Depression   ? Environmental allergies   ? GERD (gastroesophageal reflux disease)   ? Headache(784.0)   ? Recurrent sinusitis   ? ent and pulm eval in past.  ?  ?Allergies  ?Allergen Reactions  ? Augmentin [Amoxicillin-Pot Clavulanate] Nausea Only  ? Celecoxib   ?  REACTION: unspecified  ? Sulfa Antibiotics   ? Sulfonamide Derivatives   ?  REACTION: hives, throat swelling  ? Monistat [Miconazole] Other (See Comments)  ?  Sensitive to topical vaginal  Azoles No  side effects with oral Diflucan  ? ? ? ?Review of Systems: ? No headache, visual changes, nausea, vomiting, diarrhea, constipation, dizziness, abdominal pain, skin rash, fevers, chills, night sweats, weight loss, swollen lymph nodes, body aches, joint swelling, chest pain, shortness of breath, mood changes. POSITIVE muscle aches ? ?Objective  ?Blood pressure 110/70, pulse 85, height 5\' 3"  (1.6 m), weight 125 lb (56.7 kg), last menstrual period 07/19/2017, SpO2 98 %. ?  ?General: No apparent distress alert and oriented x3 mood and affect normal, dressed appropriately.  ?HEENT: Pupils equal, extraocular movements intact  ?Respiratory: Patient's speak in full sentences and does not appear short of breath  ?Cardiovascular: No lower extremity edema, non tender, no erythema  ?Hypermobility noted.  Patient still has tightness noted in the left parascapular region with what appears to be a slipped rib syndrome noted.  Patient also has tightness noted in the lumbar spine.  Mostly right sacroiliac joint. ? ?Left foot exam does have some mild swelling noted.  Of the forefoot.  This could be dorsally as well as plantar.  Severely more tender to palpation on the plantar aspect and on the dorsal aspect of the foot.  Negative squeeze test. ? ?Limited muscular skeletal ultrasound was performed and interpreted by 07/21/2017, M  ?Limited ultrasound of patient's foot shows the patient does have significant hypoechoic changes that could be consistent with more of a bursitis of the forefoot.  This seems to be under the third metatarsal.  In addition to  this though patient also has what appears to be a potential rheumatoid nodule in the area.  Tender to palpation and freely compressible.  No abnormal blood flow. ?Impression: Foot bursitis ? ?Osteopathic findings ? ?C3 flexed rotated and side bent right ?T3 extended rotated and side bent right inhaled rib ?T8 extended rotated and side bent left ?L2 flexed rotated and side bent  right ?Sacrum right on right ? ? ? ? ?  ?Assessment and Plan: ? 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  Patient is also having the hypermobility.  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  Discussed with patient about other treatment options.  Patient feels though that the manipulation is helpful for her other problems.  We will continue this in the 6 to 8-week intervals. ? ?Bursitis of foot ?Appears to be more potentially a bursitis.  Discussed with patient about potential injection.  Patient does have some anxiety and would like to hold on the injection today but will consider doing it in the near future.  Discussed with patient about the different shoes, continuing the adjustments with the custom orthotics the patient was given.  Follow-up with me again when she is ready for the injection.  We also discussed the possibility of an MRI to rule out such things as neuroma or occult fracture.  Patient will consider that as well as another choice. ?  ? ?Nonallopathic problems ? ?Decision today to treat with OMT was based on Physical Exam ? ?After verbal consent patient was treated with HVLA, ME, FPR techniques in cervical, rib, thoracic, lumbar, and sacral  areas ? ?Patient tolerated the procedure well with improvement in symptoms ? ?Patient given exercises, stretches and lifestyle modifications ? ?See medications in patient instructions if given ? ?Patient will follow up in 4-8 weeks ? ?  ? ? ?The above documentation has been reviewed and is accurate and complete Judi Saa, DO ? ? ? ?  ? ? Note: This dictation was prepared with Dragon dictation along with smaller phrase technology. Any transcriptional errors that result from this process are unintentional.    ?  ?  ? ?

## 2021-04-14 ENCOUNTER — Other Ambulatory Visit: Payer: Self-pay

## 2021-04-14 ENCOUNTER — Ambulatory Visit: Payer: BC Managed Care – PPO | Admitting: Family Medicine

## 2021-04-14 ENCOUNTER — Ambulatory Visit: Payer: Self-pay

## 2021-04-14 VITALS — BP 110/70 | HR 85 | Ht 63.0 in | Wt 125.0 lb

## 2021-04-14 DIAGNOSIS — M9903 Segmental and somatic dysfunction of lumbar region: Secondary | ICD-10-CM

## 2021-04-14 DIAGNOSIS — M9901 Segmental and somatic dysfunction of cervical region: Secondary | ICD-10-CM | POA: Diagnosis not present

## 2021-04-14 DIAGNOSIS — M940 Chondrocostal junction syndrome [Tietze]: Secondary | ICD-10-CM

## 2021-04-14 DIAGNOSIS — M79672 Pain in left foot: Secondary | ICD-10-CM

## 2021-04-14 DIAGNOSIS — M7752 Other enthesopathy of left foot: Secondary | ICD-10-CM

## 2021-04-14 DIAGNOSIS — M9904 Segmental and somatic dysfunction of sacral region: Secondary | ICD-10-CM

## 2021-04-14 DIAGNOSIS — M9908 Segmental and somatic dysfunction of rib cage: Secondary | ICD-10-CM

## 2021-04-14 DIAGNOSIS — M9902 Segmental and somatic dysfunction of thoracic region: Secondary | ICD-10-CM

## 2021-04-14 DIAGNOSIS — M7750 Other enthesopathy of unspecified foot: Secondary | ICD-10-CM | POA: Insufficient documentation

## 2021-04-14 DIAGNOSIS — M357 Hypermobility syndrome: Secondary | ICD-10-CM

## 2021-04-14 NOTE — Assessment & Plan Note (Signed)
Continue supportive syndrome secondary to patient's discomfort and pain.  Patient is also having the hypermobility.  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  Discussed with patient about other treatment options.  Patient feels though that the manipulation is helpful for her other problems.  We will continue this in the 6 to 8-week intervals. ?

## 2021-04-14 NOTE — Assessment & Plan Note (Signed)
Appears to be more potentially a bursitis.  Discussed with patient about potential injection.  Patient does have some anxiety and would like to hold on the injection today but will consider doing it in the near future.  Discussed with patient about the different shoes, continuing the adjustments with the custom orthotics the patient was given.  Follow-up with me again when she is ready for the injection.  We also discussed the possibility of an MRI to rule out such things as neuroma or occult fracture.  Patient will consider that as well as another choice. ?

## 2021-04-14 NOTE — Patient Instructions (Signed)
Good to see you! ?Can do injection in foot when you're ready or MRI ?Everything else looks good ?See you again in 2 months ?

## 2021-04-15 ENCOUNTER — Telehealth: Payer: Self-pay | Admitting: Internal Medicine

## 2021-04-15 MED ORDER — METHYLPHENIDATE HCL ER (OSM) 36 MG PO TBCR: 36.0000 mg | EXTENDED_RELEASE_TABLET | Freq: Every day | ORAL | 0 refills | Status: DC

## 2021-04-15 NOTE — Telephone Encounter (Signed)
Last refill per controlled substance database: 02/15/21 ?Last OV: 11/30/20 ?Next OV: none scheduled. ?

## 2021-04-15 NOTE — Telephone Encounter (Signed)
Done

## 2021-04-15 NOTE — Telephone Encounter (Signed)
Patient called in to request a refill for methylphenidate (CONCERTA) 36 MG PO CR tablet [[id number]] to be sent to her pharmacy. ? ?Please advise. ?

## 2021-04-15 NOTE — Addendum Note (Signed)
Addended by: Gershon Crane A on: 04/15/2021 12:36 PM ? ? Modules accepted: Orders ? ?

## 2021-04-21 DIAGNOSIS — D225 Melanocytic nevi of trunk: Secondary | ICD-10-CM | POA: Diagnosis not present

## 2021-04-21 DIAGNOSIS — L821 Other seborrheic keratosis: Secondary | ICD-10-CM | POA: Diagnosis not present

## 2021-04-21 DIAGNOSIS — L814 Other melanin hyperpigmentation: Secondary | ICD-10-CM | POA: Diagnosis not present

## 2021-05-04 DIAGNOSIS — M9902 Segmental and somatic dysfunction of thoracic region: Secondary | ICD-10-CM | POA: Diagnosis not present

## 2021-05-04 DIAGNOSIS — M5386 Other specified dorsopathies, lumbar region: Secondary | ICD-10-CM | POA: Diagnosis not present

## 2021-05-04 DIAGNOSIS — M9903 Segmental and somatic dysfunction of lumbar region: Secondary | ICD-10-CM | POA: Diagnosis not present

## 2021-05-04 DIAGNOSIS — M9905 Segmental and somatic dysfunction of pelvic region: Secondary | ICD-10-CM | POA: Diagnosis not present

## 2021-05-17 ENCOUNTER — Other Ambulatory Visit: Payer: Self-pay | Admitting: Internal Medicine

## 2021-05-23 DIAGNOSIS — M9902 Segmental and somatic dysfunction of thoracic region: Secondary | ICD-10-CM | POA: Diagnosis not present

## 2021-05-23 DIAGNOSIS — M5386 Other specified dorsopathies, lumbar region: Secondary | ICD-10-CM | POA: Diagnosis not present

## 2021-05-23 DIAGNOSIS — M9905 Segmental and somatic dysfunction of pelvic region: Secondary | ICD-10-CM | POA: Diagnosis not present

## 2021-05-23 DIAGNOSIS — M9903 Segmental and somatic dysfunction of lumbar region: Secondary | ICD-10-CM | POA: Diagnosis not present

## 2021-06-06 DIAGNOSIS — M9903 Segmental and somatic dysfunction of lumbar region: Secondary | ICD-10-CM | POA: Diagnosis not present

## 2021-06-06 DIAGNOSIS — M9905 Segmental and somatic dysfunction of pelvic region: Secondary | ICD-10-CM | POA: Diagnosis not present

## 2021-06-06 DIAGNOSIS — M9902 Segmental and somatic dysfunction of thoracic region: Secondary | ICD-10-CM | POA: Diagnosis not present

## 2021-06-06 DIAGNOSIS — M5386 Other specified dorsopathies, lumbar region: Secondary | ICD-10-CM | POA: Diagnosis not present

## 2021-06-12 ENCOUNTER — Other Ambulatory Visit: Payer: Self-pay | Admitting: Internal Medicine

## 2021-06-13 ENCOUNTER — Other Ambulatory Visit: Payer: Self-pay | Admitting: Internal Medicine

## 2021-06-13 DIAGNOSIS — M5386 Other specified dorsopathies, lumbar region: Secondary | ICD-10-CM | POA: Diagnosis not present

## 2021-06-13 DIAGNOSIS — M9903 Segmental and somatic dysfunction of lumbar region: Secondary | ICD-10-CM | POA: Diagnosis not present

## 2021-06-13 DIAGNOSIS — M9905 Segmental and somatic dysfunction of pelvic region: Secondary | ICD-10-CM | POA: Diagnosis not present

## 2021-06-13 DIAGNOSIS — M9902 Segmental and somatic dysfunction of thoracic region: Secondary | ICD-10-CM | POA: Diagnosis not present

## 2021-06-13 NOTE — Telephone Encounter (Signed)
Pt would like a refill for Rx methylphenidate 36 MG sent to [id number] - [place], [place] - [address] AT [facility] [address] Phone:  [phone]  Fax:  [phone]     Please advise.

## 2021-06-13 NOTE — Progress Notes (Unsigned)
[name] [address] [place] [address] Phone: [phone] Subjective:   Terri [name], am serving as a scribe for Dr. Antoine Primas. This visit occurred during the SARS-CoV-2 public health emergency.  Safety protocols were in place, including screening questions prior to the visit, additional usage of staff PPE, and extensive cleaning of exam room while observing appropriate contact time as indicated for disinfecting solutions.   I'm seeing this patient by the request  of:  Panosh, [name], MD  CC: Low back pain  CBJ:[id number]  04/14/2021 Bursitis of foot Appears to be more potentially a bursitis.  Discussed with patient about potential injection.  Patient does have some anxiety and would like to hold on the injection today but will consider doing it in the near future.  Discussed with patient about the different shoes, continuing the adjustments with the custom orthotics the patient was given.  Follow-up with me again when she is ready for the injection.  We also discussed the possibility of an MRI to rule out such things as neuroma or occult fracture.  Patient will consider that as well as another choice.  Update 06/14/2021 Terri [name] is a 56 y.o. female coming in with complaint of back and neck pain. OMT 04/14/2021. Also f/u for L foot pain. Patient states here for manipulation. Chiropractor using sound waves therapy for foot and has been seeing slight results. Left shoulder pain.  Patient is also looking and back pain.  States that it is a little worse.  Feels like her pelvis is out of whack   Medications patient has been prescribed: None  Taking:         Reviewed prior external information including notes and imaging from previsou exam, outside providers and external EMR if available.   As well as notes that were available from care everywhere and other healthcare systems.  Past medical history, social, surgical and family history  all reviewed in electronic medical record.  No pertanent information unless stated regarding to the chief complaint.   Past Medical History:  Diagnosis Date   Anxiety    Depression    Environmental allergies    GERD (gastroesophageal reflux disease)    Headache(784.0)    Recurrent sinusitis    ent and pulm eval in past.    Allergies  Allergen Reactions   Augmentin [Amoxicillin-Pot Clavulanate] Nausea Only   Celecoxib     REACTION: unspecified   Sulfa Antibiotics    Sulfonamide Derivatives     REACTION: hives, throat swelling   Monistat [Miconazole] Other (See Comments)    Sensitive to topical vaginal  Azoles No side effects with oral Diflucan     Review of Systems:  No headache, visual changes, nausea, vomiting, diarrhea, constipation, dizziness, abdominal pain, skin rash, fevers, chills, night sweats, weight loss, swollen lymph nodes, body aches, joint swelling, chest pain, shortness of breath, mood changes. POSITIVE muscle aches  Objective  Blood pressure 116/72, pulse 74, height 5\' 3"  (1.6 m), weight 124 lb (56.2 kg), last menstrual period 07/19/2017, SpO2 98 %.   General: No apparent distress alert and oriented x3 mood and affect normal, dressed appropriately.  HEENT: Pupils equal, extraocular movements intact  Respiratory: Patient's speak in full sentences and does not appear short of breath  Cardiovascular: No lower extremity edema, non tender, no erythema  Hypermobility noted.  Patient does have some pain with impingement of the shoulder.  Rotator cuff strength does appear to be intact.  No significant  weakness.  Neer and Juanetta Gosling is positive.  Osteopathic findings  C2 flexed rotated and side bent right C6 flexed rotated and side bent left T3 extended rotated and side bent right inhaled rib T9 extended rotated and side bent left L2 flexed rotated and side bent right Sacrum right on right Pelvic shear noted  Limited muscular skeletal ultrasound was performed and  interpreted by Antoine Primas, M  Limited ultrasound shows the patient does have some interstitial hypoechoic changes under the supraspinatus of the left.  No significant retraction but does encompass of 25 to 35% of the tendon.  Otherwise fairly unremarkable. Impression: Interstitial tearing of the supraspinatus  Positive ANA (antinuclear antibody) Significantly high titer in the past.  Strong family history of family members with autoimmune disease.  Increase activity slowly.  Follow-up again in 6 to 8 weeks otherwise.  Referred to rheumatology previously which patient has not gone through with but hopefully this time she will.  SI (sacroiliac) joint dysfunction Chronic problem with exacerbation.  Has not been seen for some time.  Has had some maybe mild flare of any underlying conditions.  Discussed icing regimen and home exercises, which activities to do and which ones to avoid.  Follow-up again in 6 to 8 weeks.  Left shoulder pain Patient has what appears to be more of a left shoulder with partial tearing noted of the supraspinatus.  Discussed with patient icing regimen and home exercises, which activities to do which ones to avoid, increase activity slowly.  Follow-up again in 6 to 8 weeks worsening pain can consider injection but likely will do well with conservative therapy     Assessment and Plan: Positive ANA (antinuclear antibody) Significantly high titer in the past.  Strong family history of family members with autoimmune disease.  Increase activity slowly.  Follow-up again in 6 to 8 weeks otherwise.  Referred to rheumatology previously which patient has not gone through with but hopefully this time she will.  SI (sacroiliac) joint dysfunction Chronic problem with exacerbation.  Has not been seen for some time.  Has had some maybe mild flare of any underlying conditions.  Discussed icing regimen and home exercises, which activities to do and which ones to avoid.  Follow-up again in 6  to 8 weeks.    Nonallopathic problems  Decision today to treat with OMT was based on Physical Exam  After verbal consent patient was treated with HVLA, ME, FPR techniques in cervical, rib, thoracic, lumbar, and sacral  areas  Patient tolerated the procedure well with improvement in symptoms  Patient given exercises, stretches and lifestyle modifications  See medications in patient instructions if given  Patient will follow up in 4-8 weeks     The above documentation has been reviewed and is accurate and complete Judi Saa, DO        Note: This dictation was prepared with Dragon dictation along with smaller phrase technology. Any transcriptional errors that result from this process are unintentional.

## 2021-06-13 NOTE — Telephone Encounter (Signed)
Last Ov 11/30/20 Filled 04/15/21 Is it ok to refill?

## 2021-06-14 ENCOUNTER — Ambulatory Visit: Payer: Self-pay

## 2021-06-14 ENCOUNTER — Ambulatory Visit: Payer: BC Managed Care – PPO | Admitting: Family Medicine

## 2021-06-14 VITALS — BP 116/72 | HR 74 | Ht 63.0 in | Wt 124.0 lb

## 2021-06-14 DIAGNOSIS — M9902 Segmental and somatic dysfunction of thoracic region: Secondary | ICD-10-CM

## 2021-06-14 DIAGNOSIS — M533 Sacrococcygeal disorders, not elsewhere classified: Secondary | ICD-10-CM | POA: Diagnosis not present

## 2021-06-14 DIAGNOSIS — R768 Other specified abnormal immunological findings in serum: Secondary | ICD-10-CM

## 2021-06-14 DIAGNOSIS — M25512 Pain in left shoulder: Secondary | ICD-10-CM | POA: Diagnosis not present

## 2021-06-14 DIAGNOSIS — M9904 Segmental and somatic dysfunction of sacral region: Secondary | ICD-10-CM

## 2021-06-14 DIAGNOSIS — M9903 Segmental and somatic dysfunction of lumbar region: Secondary | ICD-10-CM

## 2021-06-14 DIAGNOSIS — M9908 Segmental and somatic dysfunction of rib cage: Secondary | ICD-10-CM

## 2021-06-14 DIAGNOSIS — M9905 Segmental and somatic dysfunction of pelvic region: Secondary | ICD-10-CM

## 2021-06-14 DIAGNOSIS — M9901 Segmental and somatic dysfunction of cervical region: Secondary | ICD-10-CM

## 2021-06-14 NOTE — Assessment & Plan Note (Signed)
Patient has what appears to be more of a left shoulder with partial tearing noted of the supraspinatus.  Discussed with patient icing regimen and home exercises, which activities to do which ones to avoid, increase activity slowly.  Follow-up again in 6 to 8 weeks worsening pain can consider injection but likely will do well with conservative therapy

## 2021-06-14 NOTE — Patient Instructions (Addendum)
Do prescribed exercises at least 3x a week Keep hands in peripheral vision Referral for Dr. Dimple Casey See you again in 6-8 weeks

## 2021-06-14 NOTE — Assessment & Plan Note (Signed)
Significantly high titer in the past.  Strong family history of family members with autoimmune disease.  Increase activity slowly.  Follow-up again in 6 to 8 weeks otherwise.  Referred to rheumatology previously which patient has not gone through with but hopefully this time she will.

## 2021-06-14 NOTE — Assessment & Plan Note (Signed)
Chronic problem with exacerbation.  Has not been seen for some time.  Has had some maybe mild flare of any underlying conditions.  Discussed icing regimen and home exercises, which activities to do and which ones to avoid.  Follow-up again in 6 to 8 weeks.

## 2021-06-15 MED ORDER — METHYLPHENIDATE HCL ER (OSM) 36 MG PO TBCR: 36.0000 mg | EXTENDED_RELEASE_TABLET | Freq: Every day | ORAL | 0 refills | Status: DC

## 2021-06-22 DIAGNOSIS — M9903 Segmental and somatic dysfunction of lumbar region: Secondary | ICD-10-CM | POA: Diagnosis not present

## 2021-06-22 DIAGNOSIS — M5386 Other specified dorsopathies, lumbar region: Secondary | ICD-10-CM | POA: Diagnosis not present

## 2021-06-22 DIAGNOSIS — M9902 Segmental and somatic dysfunction of thoracic region: Secondary | ICD-10-CM | POA: Diagnosis not present

## 2021-06-22 DIAGNOSIS — M9905 Segmental and somatic dysfunction of pelvic region: Secondary | ICD-10-CM | POA: Diagnosis not present

## 2021-07-11 DIAGNOSIS — M9902 Segmental and somatic dysfunction of thoracic region: Secondary | ICD-10-CM | POA: Diagnosis not present

## 2021-07-11 DIAGNOSIS — M5386 Other specified dorsopathies, lumbar region: Secondary | ICD-10-CM | POA: Diagnosis not present

## 2021-07-11 DIAGNOSIS — M9905 Segmental and somatic dysfunction of pelvic region: Secondary | ICD-10-CM | POA: Diagnosis not present

## 2021-07-11 DIAGNOSIS — M9903 Segmental and somatic dysfunction of lumbar region: Secondary | ICD-10-CM | POA: Diagnosis not present

## 2021-07-13 ENCOUNTER — Telehealth: Payer: Self-pay | Admitting: Internal Medicine

## 2021-07-13 NOTE — Telephone Encounter (Signed)
Pt requesting refill of methylphenidate (CONCERTA) 36 MG PO CR tablet

## 2021-07-13 NOTE — Telephone Encounter (Signed)
Have her make appt   before I fill .  It has beenover 6 months for med check

## 2021-07-13 NOTE — Telephone Encounter (Signed)
Last Ov 11/30/20 Filled 06/15/21 Is it ok to refill?

## 2021-07-14 MED ORDER — METHYLPHENIDATE HCL ER (OSM) 36 MG PO TBCR: 36.0000 mg | EXTENDED_RELEASE_TABLET | Freq: Every day | ORAL | 0 refills | Status: DC

## 2021-07-14 NOTE — Telephone Encounter (Signed)
Sent in electronically . Let her know

## 2021-07-14 NOTE — Telephone Encounter (Signed)
Left voicemail informing Rx was sent to the pharmacy

## 2021-07-18 ENCOUNTER — Encounter: Payer: Self-pay | Admitting: Internal Medicine

## 2021-07-18 ENCOUNTER — Telehealth (INDEPENDENT_AMBULATORY_CARE_PROVIDER_SITE_OTHER): Payer: BC Managed Care – PPO | Admitting: Internal Medicine

## 2021-07-18 VITALS — Wt 117.0 lb

## 2021-07-18 DIAGNOSIS — F909 Attention-deficit hyperactivity disorder, unspecified type: Secondary | ICD-10-CM | POA: Diagnosis not present

## 2021-07-18 DIAGNOSIS — Z79899 Other long term (current) drug therapy: Secondary | ICD-10-CM | POA: Diagnosis not present

## 2021-07-18 NOTE — Progress Notes (Signed)
   Virtual Visit via Telephone Note  I connected with@ on 07/18/21 at  3:00 PM EDT by telephone and verified that I am speaking with the correct person using two identifiers.   I discussed the limitations, risks, security and privacy concerns of performing an evaluation and management service by telephone and the limited availability of in person appointments. tThere may be a patient responsible charge related to this service. The patient expressed understanding and agreed to proceed.  Location patient: home Location provider: work office Participants present for the call: patient, provider Patient did not have a visit in the prior 7 days to address this/these issue(s).   History of Present Illness: Terri [name] presents for video visit that had to be converted to telephone visit because of scheduling an expedient..  This is for follow-up med check for ADHD medication. She has been on generic Concerta 36 mg for a while tried in the recent past did take a lower dose by splitting the pill capsule in half noticed it does not work as well but did not get bad side effects.  Has been back on the 36 mg for couple months and seems to work well.  Negative significant TAD still in counseling with May doing well and stable.  Sleep is good enough. She has now on hormone replacement therapy patch and oral progesterone through her GYN.  Seems to be doing well. No new changes in medical history   Observations/Objective: Patient sounds personable and well on the phone. I do not appreciate any SOB. Speech and thought processing are grossly intact. Patient reported vitals: Lab Results  Component Value Date   WBC 6.1 03/08/2020   HGB 13.7 03/08/2020   HCT 40.6 03/08/2020   PLT 249.0 03/08/2020   GLUCOSE 92 03/08/2020   CHOL 219 (H) 03/08/2020   TRIG 66.0 03/08/2020   HDL 103.30 03/08/2020   LDLCALC 102 (H) 03/08/2020   ALT 37 (H) 03/08/2020   AST 27 03/08/2020   NA 137 03/08/2020   K 3.9  03/08/2020   CL 102 03/08/2020   CREATININE 0.80 03/08/2020   BUN 9 03/08/2020   CO2 28 03/08/2020   TSH 1.09 03/08/2020    Assessment and Plan:  Attention deficit hyperactivity disorder (ADHD), unspecified ADHD type  Medication management Benefit more than risk we will maintain at the 36 mg at this point the medication she is on does not lend itself to splitting would rather her ask for a decreased dose i trial.  Make in person visit and then about 4 months.  Otherwise continue medication.  Follow Up Instructions:  I do not believe the medication she is on is amenable to splitting safely so avoid this.   [id number] 5-10 [id number] 11-20 [id number] 21-30 I did not refer this patient for an OV in the next 24 hours for this/these issue(s).  I discussed the assessment and treatment plan with the patient. The patient was provided an opportunity to ask questions and answered. The patient agreed with the plan and demonstrated an understanding of the instructions.   The patient was advised to call back or seek an in-person evaluation if the symptoms worsen or if the condition fails to improve as anticipated.  I provided of non-face-to-face time during this encounter. Review counsel plans  Return in about 4 months (around 11/17/2021) for in person visit meds pv as indicated .  Berniece Andreas, MD

## 2021-07-20 DIAGNOSIS — M9902 Segmental and somatic dysfunction of thoracic region: Secondary | ICD-10-CM | POA: Diagnosis not present

## 2021-07-20 DIAGNOSIS — M5386 Other specified dorsopathies, lumbar region: Secondary | ICD-10-CM | POA: Diagnosis not present

## 2021-07-20 DIAGNOSIS — M9905 Segmental and somatic dysfunction of pelvic region: Secondary | ICD-10-CM | POA: Diagnosis not present

## 2021-07-20 DIAGNOSIS — M9903 Segmental and somatic dysfunction of lumbar region: Secondary | ICD-10-CM | POA: Diagnosis not present

## 2021-08-09 ENCOUNTER — Ambulatory Visit: Payer: BC Managed Care – PPO | Admitting: Family Medicine

## 2021-08-17 NOTE — Progress Notes (Unsigned)
[name] [address] [place] [address] Phone: [phone] Subjective:   Terri [name], am serving as a scribe for Dr. Antoine Primas.   I'm seeing this patient by the request  of:  Panosh, [name], MD  CC: Back pain and abdominal pain  [id number]  Terri [name] is a 56 y.o. female coming in with complaint of back and neck pain. OMT 06/14/2021. Also f/u for L foot pain. Foot pain is unchanged. Did some laser therapy which was helpful. Orthotics made in February 2023.    Patient states that she continues to have L shoulder pain but it is improving slowly. Has been doing HEP and made some ergonomic changes at her work station.   Patient has hiatal hernia for many years and she woke up with sharp pain in LLQ a few weeks ago. Patient notes being under a lot of stress due to mother being moved into long term care facility. Pain has not gotten better but not worse. Having hard time eating. Sitting up straight increases her pain but pressure on that area makes it feel better. Pain will become sharp throughout the day unexpectedly. She will break out in a sweat. No fever.   Medications patient has been prescribed: None  Taking:         Reviewed prior external information including notes and imaging from previsou exam, outside providers and external EMR if available.   As well as notes that were available from care everywhere and other healthcare systems.  Past medical history, social, surgical and family history all reviewed in electronic medical record.  No pertanent information unless stated regarding to the chief complaint.   Past Medical History:  Diagnosis Date   Anxiety    Depression    Environmental allergies    GERD (gastroesophageal reflux disease)    Headache(784.0)    Recurrent sinusitis    ent and pulm eval in past.    Allergies  Allergen Reactions   Augmentin [Amoxicillin-Pot Clavulanate] Nausea Only   Celecoxib      REACTION: unspecified   Sulfa Antibiotics    Sulfonamide Derivatives     REACTION: hives, throat swelling   Monistat [Miconazole] Other (See Comments)    Sensitive to topical vaginal  Azoles No side effects with oral Diflucan     Review of Systems:  No headache, visual changes, nausea, vomiting, diarrhea, constipation, dizziness,  skin rash, fevers, chills, night sweats, weight loss, swollen lymph nodes, , joint swelling, chest pain, shortness of breath, mood changes. POSITIVE muscle aches, body aches, abdominal pain  Objective  Blood pressure 114/70, pulse 86, height 5\' 3"  (1.6 m), weight 122 lb (55.3 kg), last menstrual period 07/19/2017.   General: No apparent distress alert and oriented x3 mood and affect normal, dressed appropriately.  HEENT: Pupils equal, extraocular movements intact  Respiratory: Patient's speak in full sentences and does not appear short of breath  Cardiovascular: No lower extremity edema, non tender, no erythema  Gait MSK:  Back low back exam does have some loss of lordosis.  Some tenderness to palpation of the paraspinal musculature Abdominal pain note unfortunately is noted in the left upper and lower quadrant.  Does have involuntary guarding noted.  No rebound tenderness noted.  Mild distention potentially of the left lower quadrant noted as well.       Assessment and Plan:  Abdominal pain, left lower quadrant Patient is having voluntary and involuntary guarding of the left quadrant pain.  Patient  is under a significant amount of stress.  Patient has not noticed any diarrhea or constipation.  Has had some white blood cells in her urine when reviewing patient's chart.  We discussed with patient about icing regimen, home exercises, which activities to do and which ones to avoid.  We will get a KUB to make sure there is no type of obstruction.  Patient has been able to eat but does state that it does not make anything worse.  No pain in the right lower  quadrant.  History of a hiatal hernia.  Patient has been stressed recently and has done some mild increase in alcohol intake so we will check lipase level as well.  Treat for presumed diverticulitis as well especially if elevated white blood cell count with doxycycline and Flagyl.  Given Diflucan secondary to history of yeast infections after antibiotic use.  Worsening pain encouraged her to go to the emergency room because CT scan would be necessary.        The above documentation has been reviewed and is accurate and complete Judi Saa, DO         Note: This dictation was prepared with Dragon dictation along with smaller phrase technology. Any transcriptional errors that result from this process are unintentional.

## 2021-08-18 ENCOUNTER — Ambulatory Visit: Payer: BC Managed Care – PPO | Admitting: Family Medicine

## 2021-08-18 ENCOUNTER — Ambulatory Visit (INDEPENDENT_AMBULATORY_CARE_PROVIDER_SITE_OTHER): Payer: BC Managed Care – PPO

## 2021-08-18 ENCOUNTER — Ambulatory Visit: Payer: Self-pay

## 2021-08-18 VITALS — BP 114/70 | HR 86 | Ht 63.0 in | Wt 122.0 lb

## 2021-08-18 DIAGNOSIS — R1032 Left lower quadrant pain: Secondary | ICD-10-CM | POA: Insufficient documentation

## 2021-08-18 DIAGNOSIS — M79672 Pain in left foot: Secondary | ICD-10-CM

## 2021-08-18 DIAGNOSIS — R1010 Upper abdominal pain, unspecified: Secondary | ICD-10-CM | POA: Diagnosis not present

## 2021-08-18 DIAGNOSIS — R5383 Other fatigue: Secondary | ICD-10-CM | POA: Diagnosis not present

## 2021-08-18 DIAGNOSIS — R109 Unspecified abdominal pain: Secondary | ICD-10-CM | POA: Diagnosis not present

## 2021-08-18 DIAGNOSIS — M2550 Pain in unspecified joint: Secondary | ICD-10-CM | POA: Diagnosis not present

## 2021-08-18 LAB — CBC WITH DIFFERENTIAL/PLATELET
Basophils Absolute: 0 10*3/uL (ref 0.0–0.1)
Basophils Relative: 0.3 % (ref 0.0–3.0)
Eosinophils Absolute: 0.2 10*3/uL (ref 0.0–0.7)
Eosinophils Relative: 2.7 % (ref 0.0–5.0)
HCT: 42.6 % (ref 36.0–46.0)
Hemoglobin: 14.1 g/dL (ref 12.0–15.0)
Lymphocytes Relative: 20.1 % (ref 12.0–46.0)
Lymphs Abs: 1.3 10*3/uL (ref 0.7–4.0)
MCHC: 33.2 g/dL (ref 30.0–36.0)
MCV: 102.7 fl — ABNORMAL HIGH (ref 78.0–100.0)
Monocytes Absolute: 0.6 10*3/uL (ref 0.1–1.0)
Monocytes Relative: 9.3 % (ref 3.0–12.0)
Neutro Abs: 4.5 10*3/uL (ref 1.4–7.7)
Neutrophils Relative %: 67.6 % (ref 43.0–77.0)
Platelets: 244 10*3/uL (ref 150.0–400.0)
RBC: 4.15 Mil/uL (ref 3.87–5.11)
RDW: 13.1 % (ref 11.5–15.5)
WBC: 6.7 10*3/uL (ref 4.0–10.5)

## 2021-08-18 LAB — SEDIMENTATION RATE: Sed Rate: 21 mm/hr (ref 0–30)

## 2021-08-18 LAB — URINALYSIS, ROUTINE W REFLEX MICROSCOPIC
Bilirubin Urine: NEGATIVE
Ketones, ur: NEGATIVE
Leukocytes,Ua: NEGATIVE
Nitrite: NEGATIVE
Specific Gravity, Urine: <=1.005 — AB (ref 1.000–1.030)
Total Protein, Urine: NEGATIVE
Urine Glucose: NEGATIVE
Urobilinogen, UA: 0.2 (ref 0.0–1.0)
pH: 6 (ref 5.0–8.0)

## 2021-08-18 LAB — COMPREHENSIVE METABOLIC PANEL
ALT: 17 U/L (ref 0–35)
AST: 22 U/L (ref 0–37)
Albumin: 4.7 g/dL (ref 3.5–5.2)
Alkaline Phosphatase: 59 U/L (ref 39–117)
BUN: 10 mg/dL (ref 6–23)
CO2: 29 mEq/L (ref 19–32)
Calcium: 10.1 mg/dL (ref 8.4–10.5)
Chloride: 100 mEq/L (ref 96–112)
Creatinine, Ser: 0.87 mg/dL (ref 0.40–1.20)
GFR: 73.85 mL/min (ref >60.00)
Glucose, Bld: 83 mg/dL (ref 70–99)
Potassium: 4.4 mEq/L (ref 3.5–5.1)
Sodium: 137 mEq/L (ref 135–145)
Total Bilirubin: 0.7 mg/dL (ref 0.2–1.2)
Total Protein: 7.9 g/dL (ref 6.0–8.3)

## 2021-08-18 LAB — LIPASE: Lipase: 25 U/L (ref 11.0–59.0)

## 2021-08-18 MED ORDER — FLUCONAZOLE 200 MG PO TABS: 200.0000 mg | ORAL_TABLET | Freq: Every day | ORAL | 0 refills | Status: DC

## 2021-08-18 MED ORDER — DOXYCYCLINE HYCLATE 100 MG PO TABS: 100.0000 mg | ORAL_TABLET | Freq: Two times a day (BID) | ORAL | 0 refills | Status: DC

## 2021-08-18 MED ORDER — METRONIDAZOLE 500 MG PO TABS: 500.0000 mg | ORAL_TABLET | Freq: Two times a day (BID) | ORAL | 0 refills | Status: DC

## 2021-08-18 NOTE — Patient Instructions (Addendum)
Day 1, 5, and 10 for diflucan prescription Other prescriptions filled Labs today Xray today If pain gets significantly worse you need to go to ER!!! See you again in 4 weeks

## 2021-08-18 NOTE — Assessment & Plan Note (Signed)
Patient is having voluntary and involuntary guarding of the left quadrant pain.  Patient is under a significant amount of stress.  Patient has not noticed any diarrhea or constipation.  Has had some white blood cells in her urine when reviewing patient's chart.  We discussed with patient about icing regimen, home exercises, which activities to do and which ones to avoid.  We will get a KUB to make sure there is no type of obstruction.  Patient has been able to eat but does state that it does not make anything worse.  No pain in the right lower quadrant.  History of a hiatal hernia.  Patient has been stressed recently and has done some mild increase in alcohol intake so we will check lipase level as well.  Treat for presumed diverticulitis as well especially if elevated white blood cell count with doxycycline and Flagyl.  Given Diflucan secondary to history of yeast infections after antibiotic use.  Worsening pain encouraged her to go to the emergency room because CT scan would be necessary.

## 2021-08-19 LAB — LACTATE DEHYDROGENASE: LDH: 126 U/L (ref 120–250)

## 2021-08-26 ENCOUNTER — Telehealth: Payer: Self-pay | Admitting: Internal Medicine

## 2021-08-26 NOTE — Telephone Encounter (Signed)
Pt is calling and would refill methylphenidate (CONCERTA) 36 MG PO CR tablet  [id number] - [place], [place] - [address] AT [facility] [address] Phone:  [phone]  Fax:  [phone]

## 2021-08-26 NOTE — Telephone Encounter (Signed)
Last OV: 07/18/21 (video visit) Next OV: none scheduled

## 2021-08-29 MED ORDER — METHYLPHENIDATE HCL ER (OSM) 36 MG PO TBCR: 36.0000 mg | EXTENDED_RELEASE_TABLET | Freq: Every day | ORAL | 0 refills | Status: DC

## 2021-08-29 NOTE — Telephone Encounter (Signed)
Sent in electronically .  

## 2021-09-14 NOTE — Progress Notes (Signed)
[name] [address] [place] [address] Phone: [phone] Subjective:   INadine Counts, am serving as a scribe for Dr. Antoine Primas.  I'm seeing this patient by the request  of:  Panosh, [name], MD  CC: Neck and back pain follow-up, foot pain follow-up  [id number]  08/18/2021 Patient is having voluntary and involuntary guarding of the left quadrant pain.  Patient is under a significant amount of stress.  Patient has not noticed any diarrhea or constipation.  Has had some white blood cells in her urine when reviewing patient's chart.  We discussed with patient about icing regimen, home exercises, which activities to do and which ones to avoid.  We will get a KUB to make sure there is no type of obstruction.  Patient has been able to eat but does state that it does not make anything worse.  No pain in the right lower quadrant.  History of a hiatal hernia.  Patient has been stressed recently and has done some mild increase in alcohol intake so we will check lipase level as well.  Treat for presumed diverticulitis as well especially if elevated white blood cell count with doxycycline and Flagyl.  Given Diflucan secondary to history of yeast infections after antibiotic use.  Worsening pain encouraged her to go to the emergency room because CT scan would be necessary.  Updated 09/15/2021 Terri [name] is a 56 y.o. female coming in with complaint of polyarthralgia.  Patient also had some hematuria at last visit with patient continuing to have back pain.  No sign of any type of infectious etiology.  Patient did have a KUB showing the patient did have a kidney stone possible. Pain in the side is still there, but is sporadic. Never completely goes away, but waivers on the pain [name]. Started having sciatic pain on the left side. Shoulder seems to be doing a little better. Shoes to show you for feet.  Consider a renal ultrasound     Past Medical History:   Diagnosis Date   Anxiety    Depression    Environmental allergies    GERD (gastroesophageal reflux disease)    Headache(784.0)    Recurrent sinusitis    ent and pulm eval in past.   Past Surgical History:  Procedure Laterality Date   HIP ARTHROSCOPY W/ LABRAL DEBRIDEMENT     left hip surgery     [facility]   Social History   Socioeconomic History   Marital status: Single    Spouse name: Not on file   Number of children: Not on file   Years of education: Not on file   Highest education level: Not on file  Occupational History   Not on file  Tobacco Use   Smoking status: Never   Smokeless tobacco: Never  Vaping Use   Vaping Use: Never used  Substance and Sexual Activity   Alcohol use: Yes    Comment: Occasional use   Drug use: No   Sexual activity: Yes    Birth control/protection: Pill  Other Topics Concern   Not on file  Social History Narrative   esthetician   Non smoker   HH of 1    Social Determinants of Health   Financial Resource Strain: Not on file  Food Insecurity: Not on file  Transportation Needs: Not on file  Physical Activity: Not on file  Stress: Not on file  Social Connections: Not on file   Allergies  Allergen Reactions  Augmentin [Amoxicillin-Pot Clavulanate] Nausea Only   Celecoxib     REACTION: unspecified   Sulfa Antibiotics    Sulfonamide Derivatives     REACTION: hives, throat swelling   Monistat [Miconazole] Other (See Comments)    Sensitive to topical vaginal  Azoles No side effects with oral Diflucan   Family History  Problem Relation Age of Onset   Heart disease Other        fhx    Current Outpatient Medications (Endocrine & Metabolic):    estradiol (CLIMARA - DOSED IN MG/24 HR) 0.025 mg/24hr patch, Place 0.025 mg onto the skin once a week.   Norethin-Eth Estrad-Fe Biphas (LO LOESTRIN FE PO), Take by mouth.   progesterone (PROMETRIUM) 200 MG capsule, Take 200 mg by mouth daily.  Current Outpatient Medications  (Cardiovascular):    spironolactone (ALDACTONE) 25 MG tablet, Take 25 mg by mouth daily.  Current Outpatient Medications (Respiratory):    fluticasone (FLONASE) 50 MCG/ACT nasal spray, SPRAY 2 SPRAYS INTO EACH NOSTRIL EVERY DAY  Current Outpatient Medications (Analgesics):    meloxicam (MOBIC) 15 MG tablet, as needed.    SUMAtriptan (IMITREX) 100 MG tablet, Take 1 at onset of  Migraine headache   and can repeat in 2 hours .   Current Outpatient Medications (Other):    ALPRAZolam (XANAX) 0.25 MG tablet, Take 1 tablet (0.25 mg total) by mouth 3 (three) times daily as needed for anxiety (panic).   buPROPion (WELLBUTRIN XL) 300 MG 24 hr tablet, TAKE 1 TABLET BY MOUTH EVERY DAY   doxycycline (VIBRA-TABS) 100 MG tablet, Take 1 tablet (100 mg total) by mouth 2 (two) times daily.   doxycycline (VIBRA-TABS) 100 MG tablet, Take 1 tablet (100 mg total) by mouth 2 (two) times daily.   fish oil-omega-3 fatty acids 1000 MG capsule, Take 1 g by mouth daily.   fluconazole (DIFLUCAN) 150 MG tablet, 1 pill today then repeat in 4 days   fluconazole (DIFLUCAN) 200 MG tablet, Take 1 tablet (200 mg total) by mouth daily.   gabapentin (NEURONTIN) 100 MG capsule, TAKE 2 CAPSULES BY MOUTH EVERY DAY AT BEDTIME   Hydrocortisone Butyrate 0.1 % OINT, hydrocortisone butyrate 0.1 % topical ointment   Ivermectin (SOOLANTRA) 1 % CREA, Soolantra 1 % topical cream   LYSINE PO, Take 1,000 mg by mouth daily. for fever blisters.   methylphenidate (CONCERTA) 36 MG PO CR tablet, Take 1 tablet (36 mg total) by mouth daily.   metroNIDAZOLE (FLAGYL) 500 MG tablet, Take 1 tablet (500 mg total) by mouth 2 (two) times daily.   Nutritional Supplements (DHEA PO), Take by mouth.    pregabalin (LYRICA) 50 MG capsule, Take 50 mg by mouth 2 (two) times daily.   valACYclovir (VALTREX) 1000 MG tablet, Take 1 tablet (1,000 mg total) by mouth 2 (two) times daily. As direted   Vitamin D, Ergocalciferol, (DRISDOL) 50000 units CAPS capsule, TAKE  1 CAPSULE EVERY 7 DAYS   Reviewed prior external information including notes and imaging from  primary care provider As well as notes that were available from care everywhere and other healthcare systems.  Past medical history, social, surgical and family history all reviewed in electronic medical record.  No pertanent information unless stated regarding to the chief complaint.   Review of Systems:  No headache, visual changes, nausea, vomiting, diarrhea, constipation, dizziness, abdominal pain, skin rash, fevers, chills, night sweats, weight loss, swollen lymph nodes, body aches, joint swelling, chest pain, shortness of breath, mood changes. POSITIVE muscle aches  Objective  Pulse (!) 103, height 5\' 3"  (1.6 m), weight 124 lb (56.2 kg), last menstrual period 07/19/2017, SpO2 96 %.   General: No apparent distress alert and oriented x3 mood and affect normal, dressed appropriately.  HEENT: Pupils equal, extraocular movements intact  Respiratory: Patient's speak in full sentences and does not appear short of breath  Cardiovascular: No lower extremity edema, non tender, no erythema  Patient has diffuse tenderness to palpation all over the joints but more in the thoracolumbar juncture on the left side.  Patient potentially a slipped rib.  Does still have some abdominal pain in the upper left quadrant.  No rebound tenderness noted.   Osteopathic findings C4 flexed rotated and side bent left C6 flexed rotated and side bent left T3 extended rotated and side bent left inhaled third rib T9 extended rotated and side bent left inhaled rib L2 flexed rotated and side bent right Sacrum right on right Pelvic shear noted    Impression and Recommendations:    The above documentation has been reviewed and is accurate and complete 07/21/2017, DO

## 2021-09-15 ENCOUNTER — Ambulatory Visit: Payer: BC Managed Care – PPO | Admitting: Family Medicine

## 2021-09-15 ENCOUNTER — Ambulatory Visit: Payer: Self-pay

## 2021-09-15 VITALS — HR 103 | Ht 63.0 in | Wt 124.0 lb

## 2021-09-15 DIAGNOSIS — M9905 Segmental and somatic dysfunction of pelvic region: Secondary | ICD-10-CM | POA: Diagnosis not present

## 2021-09-15 DIAGNOSIS — M940 Chondrocostal junction syndrome [Tietze]: Secondary | ICD-10-CM

## 2021-09-15 DIAGNOSIS — M79672 Pain in left foot: Secondary | ICD-10-CM

## 2021-09-15 DIAGNOSIS — M9903 Segmental and somatic dysfunction of lumbar region: Secondary | ICD-10-CM | POA: Diagnosis not present

## 2021-09-15 DIAGNOSIS — M9901 Segmental and somatic dysfunction of cervical region: Secondary | ICD-10-CM

## 2021-09-15 DIAGNOSIS — M9908 Segmental and somatic dysfunction of rib cage: Secondary | ICD-10-CM | POA: Diagnosis not present

## 2021-09-15 DIAGNOSIS — M9902 Segmental and somatic dysfunction of thoracic region: Secondary | ICD-10-CM

## 2021-09-15 DIAGNOSIS — M999 Biomechanical lesion, unspecified: Secondary | ICD-10-CM | POA: Diagnosis not present

## 2021-09-15 DIAGNOSIS — M9904 Segmental and somatic dysfunction of sacral region: Secondary | ICD-10-CM

## 2021-09-15 NOTE — Patient Instructions (Signed)
Have fun in [facility]

## 2021-09-16 NOTE — Assessment & Plan Note (Signed)
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  Discussed with patient about icing regimen and home exercises.  Discussed with patient about different medications.  Still think that there is underlying autoimmune that could be also contributing to patient's aches and pains.  Patient is still avoiding certain activities though.  Follow-up with me again in 6 to 8 weeks continue medications including meloxicam as needed, Lyrica, gabapentin

## 2021-09-16 NOTE — Assessment & Plan Note (Signed)
   Decision today to treat with OMT was based on Physical Exam  After verbal consent patient was treated with HVLA, ME, FPR techniques in cervical, thoracic, rib, lumbar and sacral spine pelvis areas, all areas are chronic   Patient tolerated the procedure well with improvement in symptoms  Patient given exercises, stretches and lifestyle modifications  See medications in patient instructions if given  Patient will follow up in 4-8 weeks

## 2021-10-06 ENCOUNTER — Telehealth: Payer: Self-pay | Admitting: Internal Medicine

## 2021-10-06 NOTE — Telephone Encounter (Signed)
Requesting refill of methylphenidate (CONCERTA) 36 MG PO CR tablet  pt states she only has 2 tablets remaining

## 2021-10-10 DIAGNOSIS — M9902 Segmental and somatic dysfunction of thoracic region: Secondary | ICD-10-CM | POA: Diagnosis not present

## 2021-10-10 DIAGNOSIS — M5386 Other specified dorsopathies, lumbar region: Secondary | ICD-10-CM | POA: Diagnosis not present

## 2021-10-10 DIAGNOSIS — M9905 Segmental and somatic dysfunction of pelvic region: Secondary | ICD-10-CM | POA: Diagnosis not present

## 2021-10-10 DIAGNOSIS — M9903 Segmental and somatic dysfunction of lumbar region: Secondary | ICD-10-CM | POA: Diagnosis not present

## 2021-10-10 NOTE — Telephone Encounter (Signed)
Lov: 07/18/2021  Lo: 08/29/2021  Please advise.

## 2021-10-11 NOTE — Telephone Encounter (Signed)
Pt checking on progress of this refill, has been out since Friday. Please call with update.

## 2021-10-12 ENCOUNTER — Telehealth: Payer: BC Managed Care – PPO | Admitting: Family Medicine

## 2021-10-12 MED ORDER — METHYLPHENIDATE HCL ER (OSM) 36 MG PO TBCR: 36.0000 mg | EXTENDED_RELEASE_TABLET | Freq: Every day | ORAL | 0 refills | Status: DC

## 2021-10-12 NOTE — Telephone Encounter (Signed)
Patient is made aware. Appt scheduled on 11/10/2021.

## 2021-10-12 NOTE — Telephone Encounter (Signed)
I was not in office  when message came in  nor 9 18 afternoon.and didn't see a requst for renewal   Apologies  sent in now and can make a fu visit  med check end of October .

## 2021-10-12 NOTE — Addendum Note (Signed)
Addended byBurnis Medin on: 10/12/2021 03:38 PM   Modules accepted: Orders

## 2021-10-12 NOTE — Telephone Encounter (Addendum)
Pt is very concerned that she still has not received her refill. Pt is completely out and was told by MD that she should never miss any days. Pt is now not feel well without her medication and is starting to have the shakes.  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   If there is an issue, please call Pt and let her know what can be done so that she can have this refill ASAP!!  LOV:  11/30/20 LVV:   07/18/21  *Pt has been scheduled with Dr. Elease Hashimoto this evening for a Virtual Visit.  Please advise.  [id number] - [place], [place] [address] [place] Phone:  [phone]  Fax:  [phone]

## 2021-10-14 NOTE — Progress Notes (Unsigned)
[name] [address] [place] [address] Phone: [phone] Subjective:   Terri [name], am serving as a scribe for Dr. Antoine Primas.   I'm seeing this patient by the request  of:  Panosh, [name], MD  CC: Low back pain follow-up  [id number]  Terri [name] is a 56 y.o. female coming in with complaint of back and neck pain. OMT on 09/15/2021. Patient states that her entire spine has been bothering her.   Continues to have pain in LUQ. Did chiro and her pain did improve. Pain still present but more manageable.   Has not follow up in regards to blood in urine. Recommended by GYN to go to urology. She would also like to referral to rheumatology.   Notes that her heart will race after she eats and feels shaky today. Patient has been under stress but does not feel like she is under any more stress today than previous days.   Medications patient has been prescribed: doxycycline difulcan flagyn  Taking:         Reviewed prior external information including notes and imaging from previsou exam, outside providers and external EMR if available.   As well as notes that were available from care everywhere and other healthcare systems.  Past medical history, social, surgical and family history all reviewed in electronic medical record.  No pertanent information unless stated regarding to the chief complaint.   Past Medical History:  Diagnosis Date   Anxiety    Depression    Environmental allergies    GERD (gastroesophageal reflux disease)    Headache(784.0)    Recurrent sinusitis    ent and pulm eval in past.    Allergies  Allergen Reactions   Augmentin [Amoxicillin-Pot Clavulanate] Nausea Only   Celecoxib     REACTION: unspecified   Sulfa Antibiotics    Sulfonamide Derivatives     REACTION: hives, throat swelling   Monistat [Miconazole] Other (See Comments)    Sensitive to topical vaginal  Azoles No side effects with oral  Diflucan     Review of Systems:  No headache, visual changes, nausea, vomiting, diarrhea, constipation, dizziness, abdominal pain, skin rash, fevers, chills, night sweats, weight loss, swollen lymph nodes,  chest pain, shortness of breath, mood changes. POSITIVE muscle aches, body aches, joint swelling  Objective  Blood pressure 114/72, pulse 92, height 5\' 3"  (1.6 m), weight 124 lb (56.2 kg), last menstrual period 07/19/2017, SpO2 97 %.   General: No apparent distress alert and oriented x3 mood and affect normal, dressed appropriately.  HEENT: Pupils equal, extraocular movements intact  Respiratory: Patient's speak in full sentences and does not appear short of breath  Cardiovascular: No lower extremity edema, non tender, no erythema  -Mobility noted but does have actually more tightness noted in the low back.  Lacks last 10 degrees of extension of the back.  Patient does have tenderness to palpation diffusely from the cervical to the lumbar area.  Osteopathic findings  C7 flexed rotated and side bent left T3 extended rotated and side bent right inhaled rib T8 extended rotated and side bent left L2 flexed rotated and side bent right Sacrum right on right       Assessment and Plan:  Positive ANA (antinuclear antibody) Once again referred to rheumatology.  Strong family history including sister and mother.  Lumbar radicular pain Responding to chiropractic care, home exercises, osteopathic manipulation here.  Gabapentin patient has and we have done other medications over  the course of time which patient has failed with exacerbations including Lyrica.  Has meloxicam for breakthrough.  Follow-up again in 6 to 8 weeks otherwise.  Hematuria Referred to urology for further evaluation for kidney stones    Nonallopathic problems  Decision today to treat with OMT was based on Physical Exam  After verbal consent patient was treated with HVLA, ME, FPR techniques in cervical, rib,  thoracic, lumbar, and sacral  areas  Patient tolerated the procedure well with improvement in symptoms  Patient given exercises, stretches and lifestyle modifications  See medications in patient instructions if given  Patient will follow up in 4-8 weeks    The above documentation has been reviewed and is accurate and complete Lyndal Pulley, DO          Note: This dictation was prepared with Dragon dictation along with smaller phrase technology. Any transcriptional errors that result from this process are unintentional.

## 2021-10-17 ENCOUNTER — Encounter: Payer: Self-pay | Admitting: Family Medicine

## 2021-10-17 ENCOUNTER — Ambulatory Visit: Payer: BC Managed Care – PPO | Admitting: Family Medicine

## 2021-10-17 VITALS — BP 114/72 | HR 92 | Ht 63.0 in | Wt 124.0 lb

## 2021-10-17 DIAGNOSIS — R768 Other specified abnormal immunological findings in serum: Secondary | ICD-10-CM

## 2021-10-17 DIAGNOSIS — R319 Hematuria, unspecified: Secondary | ICD-10-CM | POA: Diagnosis not present

## 2021-10-17 DIAGNOSIS — M9901 Segmental and somatic dysfunction of cervical region: Secondary | ICD-10-CM | POA: Diagnosis not present

## 2021-10-17 DIAGNOSIS — M9908 Segmental and somatic dysfunction of rib cage: Secondary | ICD-10-CM

## 2021-10-17 DIAGNOSIS — M9902 Segmental and somatic dysfunction of thoracic region: Secondary | ICD-10-CM

## 2021-10-17 DIAGNOSIS — M5416 Radiculopathy, lumbar region: Secondary | ICD-10-CM

## 2021-10-17 DIAGNOSIS — M9903 Segmental and somatic dysfunction of lumbar region: Secondary | ICD-10-CM | POA: Diagnosis not present

## 2021-10-17 DIAGNOSIS — M9904 Segmental and somatic dysfunction of sacral region: Secondary | ICD-10-CM

## 2021-10-17 NOTE — Patient Instructions (Signed)
Referral to urology and rheum See me again in 6 weeks

## 2021-10-18 DIAGNOSIS — R319 Hematuria, unspecified: Secondary | ICD-10-CM | POA: Insufficient documentation

## 2021-10-18 NOTE — Assessment & Plan Note (Signed)
Referred to urology for further evaluation for kidney stones

## 2021-10-18 NOTE — Assessment & Plan Note (Signed)
Once again referred to rheumatology.  Strong family history including sister and mother.

## 2021-10-18 NOTE — Assessment & Plan Note (Signed)
Responding to chiropractic care, home exercises, osteopathic manipulation here.  Gabapentin patient has and we have done other medications over the course of time which patient has failed with exacerbations including Lyrica.  Has meloxicam for breakthrough.  Follow-up again in 6 to 8 weeks otherwise.

## 2021-10-26 DIAGNOSIS — M9905 Segmental and somatic dysfunction of pelvic region: Secondary | ICD-10-CM | POA: Diagnosis not present

## 2021-10-26 DIAGNOSIS — M9902 Segmental and somatic dysfunction of thoracic region: Secondary | ICD-10-CM | POA: Diagnosis not present

## 2021-10-26 DIAGNOSIS — M9903 Segmental and somatic dysfunction of lumbar region: Secondary | ICD-10-CM | POA: Diagnosis not present

## 2021-10-26 DIAGNOSIS — M5386 Other specified dorsopathies, lumbar region: Secondary | ICD-10-CM | POA: Diagnosis not present

## 2021-10-31 DIAGNOSIS — M5386 Other specified dorsopathies, lumbar region: Secondary | ICD-10-CM | POA: Diagnosis not present

## 2021-10-31 DIAGNOSIS — M9905 Segmental and somatic dysfunction of pelvic region: Secondary | ICD-10-CM | POA: Diagnosis not present

## 2021-10-31 DIAGNOSIS — M9902 Segmental and somatic dysfunction of thoracic region: Secondary | ICD-10-CM | POA: Diagnosis not present

## 2021-10-31 DIAGNOSIS — M9903 Segmental and somatic dysfunction of lumbar region: Secondary | ICD-10-CM | POA: Diagnosis not present

## 2021-11-06 ENCOUNTER — Other Ambulatory Visit: Payer: Self-pay | Admitting: Internal Medicine

## 2021-11-07 DIAGNOSIS — M9902 Segmental and somatic dysfunction of thoracic region: Secondary | ICD-10-CM | POA: Diagnosis not present

## 2021-11-07 DIAGNOSIS — M9905 Segmental and somatic dysfunction of pelvic region: Secondary | ICD-10-CM | POA: Diagnosis not present

## 2021-11-07 DIAGNOSIS — M5386 Other specified dorsopathies, lumbar region: Secondary | ICD-10-CM | POA: Diagnosis not present

## 2021-11-07 DIAGNOSIS — M9903 Segmental and somatic dysfunction of lumbar region: Secondary | ICD-10-CM | POA: Diagnosis not present

## 2021-11-10 ENCOUNTER — Telehealth (INDEPENDENT_AMBULATORY_CARE_PROVIDER_SITE_OTHER): Payer: BC Managed Care – PPO | Admitting: Internal Medicine

## 2021-11-10 ENCOUNTER — Encounter: Payer: Self-pay | Admitting: Internal Medicine

## 2021-11-10 VITALS — Ht 63.0 in | Wt 124.0 lb

## 2021-11-10 DIAGNOSIS — F439 Reaction to severe stress, unspecified: Secondary | ICD-10-CM

## 2021-11-10 DIAGNOSIS — Z1211 Encounter for screening for malignant neoplasm of colon: Secondary | ICD-10-CM | POA: Diagnosis not present

## 2021-11-10 DIAGNOSIS — F909 Attention-deficit hyperactivity disorder, unspecified type: Secondary | ICD-10-CM

## 2021-11-10 DIAGNOSIS — Z79899 Other long term (current) drug therapy: Secondary | ICD-10-CM

## 2021-11-10 DIAGNOSIS — R3121 Asymptomatic microscopic hematuria: Secondary | ICD-10-CM

## 2021-11-10 MED ORDER — METHYLPHENIDATE HCL ER (OSM) 36 MG PO TBCR
36.0000 mg | EXTENDED_RELEASE_TABLET | Freq: Every day | ORAL | 0 refills | Status: DC
Start: 2021-11-10 — End: 2021-12-09

## 2021-11-10 NOTE — Progress Notes (Signed)
   Virtual Visit via Telephone Note  I connected with Terri [name] on 11/10/21 at  9:30 AM EDT by telephone and verified that I am speaking with the correct person using two identifiers.   I discussed the limitations, risks, security and privacy concerns of performing an evaluation and management service by telephone and the limited availability of in person appointments. tThere may be a patient responsible charge related to this service. The patient expressed understanding and agreed to proceed.  Location patient: home Location provider:  home office Participants present for the call: patient, provider Patient did not have a visit in the prior 7 days to address this/these issue(s).   History of Present Illness: Terri [name]   presents for med check  Adhd    concerta still helpful notes if off  forgets less concentration and some depressive sx , no untoward se noted   Anxiety stable  lokds or stress mom age 8 placed in assisted living . No use of benzo  Wellbutrin  ok  to remain at this time  Under care  SM , lumbar pain  other  pos ana  reeval by rheumatology advised by dr Tamala Julian Also to see urology for asymptomatic  microscopic hematuria  Utd pap  Mammo scheduled   Colon screen due    Observations/Objective: Patient sounds personable and well on the phone. I do not appreciate any SOB. Speech and thought processing are grossly intact. Patient reported vitals: Lab Results  Component Value Date   WBC 6.7 08/18/2021   HGB 14.1 08/18/2021   HCT 42.6 08/18/2021   PLT 244.0 08/18/2021   GLUCOSE 83 08/18/2021   CHOL 219 (H) 03/08/2020   TRIG 66.0 03/08/2020   HDL 103.30 03/08/2020   LDLCALC 102 (H) 03/08/2020   ALT 17 08/18/2021   AST 22 08/18/2021   NA 137 08/18/2021   K 4.4 08/18/2021   CL 100 08/18/2021   CREATININE 0.87 08/18/2021   BUN 10 08/18/2021   CO2 29 08/18/2021   TSH 1.09 03/08/2020    Assessment and Plan:  Attention deficit hyperactivity disorder  (ADHD), unspecified ADHD type - Benefit more than risk refill Concerta continue recheck 6 months  Colon cancer screening - Plan: Ambulatory referral to Gastroenterology  Medication management - Continue Wellbutrin refill when due check 6 months  Asymptomatic microscopic hematuria  Stress  Follow Up Instructions:  39-month recheck or as indicated Referral for routine colonoscopy due She has appointment for mammogram and is up-to-date for Pap smear. Explained condition of microscopic hematuria can often be a benign condition.  Urology appointment pending  Has counselor  support   [phone] 5-10 [phone] 11-20 [phone] 21-30 I did not refer this patient for an OV in the next 24 hours for this/these issue(s).  I discussed the assessment and treatment plan with the patient. The patient was provided an opportunity to ask questions and answered. The patient agreed with the plan and demonstrated an understanding of the instructions.   The patient was advised to call back or seek an in-person evaluation if the symptoms worsen or if the condition fails to improve as anticipated.  I provided 21 minutes of non-face-to-face time during this encounter. No follow-ups on file.  Shanon Ace, MD

## 2021-11-23 NOTE — Progress Notes (Signed)
[facility] [place] Phone: [phone] Subjective:   Terri [name], am serving as a scribe for Dr. Hulan Saas.  I'm seeing this patient by the request  of:  Panosh, Standley Brooking, MD  CC: Back and neck pain follow-up  [id number]  Terri [name] is a 56 y.o. female coming in with complaint of back and neck pain. OMT 10/17/2021. Referral to rheumatology and urology. Patient states that she is having a weird lower right buttock pain that radiates to the bottom of her right heel. Patient has the appointment with a rheumatologist in January but has not heard from the urologist. Gave patient the number for urology to call and schedule.   Medications patient has been prescribed: None  Taking:         Reviewed prior external information including notes and imaging from previsou exam, outside providers and external EMR if available.   As well as notes that were available from care everywhere and other healthcare systems.  Past medical history, social, surgical and family history all reviewed in electronic medical record.  No pertanent information unless stated regarding to the chief complaint.   Past Medical History:  Diagnosis Date   Anxiety    Depression    Environmental allergies    GERD (gastroesophageal reflux disease)    Headache(784.0)    Recurrent sinusitis    ent and pulm eval in past.    Allergies  Allergen Reactions   Augmentin [Amoxicillin-Pot Clavulanate] Nausea Only   Celecoxib     REACTION: unspecified   Sulfa Antibiotics    Sulfonamide Derivatives     REACTION: hives, throat swelling   Monistat [Miconazole] Other (See Comments)    Sensitive to topical vaginal  Azoles No side effects with oral Diflucan     Review of Systems:  No headache, visual changes, nausea, vomiting, diarrhea, constipation, dizziness, abdominal pain, skin rash, fevers, chills, night sweats, weight loss, swollen lymph  nodes, body aches, joint swelling, chest pain, shortness of breath, mood changes. POSITIVE muscle aches  Objective  Blood pressure 100/60, pulse 78, height 5\' 3"  (1.6 m), weight 125 lb (56.7 kg), last menstrual period 07/19/2017, SpO2 98 %.   General: No apparent distress alert and oriented x3 mood and affect normal, dressed appropriately.  HEENT: Pupils equal, extraocular movements intact  Respiratory: Patient's speak in full sentences and does not appear short of breath  Cardiovascular: No lower extremity edema, non tender, no erythema  Gait MSK:  Back does have some loss of lordosis.  Patient does have hypermobility noted.  Some increasing in weakness noted in the right gluteal area.  Osteopathic findings  C2 flexed rotated and side bent right C5 flexed rotated and side bent left T3 extended rotated and side bent right inhaled rib T9 extended rotated and side bent left L2 flexed rotated and side bent right Sacrum right on right Pelvic shear noted      Assessment and Plan:  Lumbar radicular pain Mild increase in weakness noted of the gluteal area.  Attempted osteopathic manipulation.  Discussed taking the gabapentin again on a more regular basis.  Discussed hip abductor strengthening and avoid as much stretching as she has been doing.  I do feel that this will be more helpful.  Discussed which activities to do and which ones to avoid.  Follow-up again in 6 to 8 weeks    Nonallopathic problems  Decision today to treat with OMT was based on Physical Exam  After verbal consent patient was treated with HVLA, ME, FPR techniques in cervical, rib, thoracic, lumbar, and sacral and pelvis areas  Patient tolerated the procedure well with improvement in symptoms  Patient given exercises, stretches and lifestyle modifications  See medications in patient instructions if given  Patient will follow up in 4-8 weeks      The above documentation has been reviewed and is accurate and  complete Judi Saa, DO        Note: This dictation was prepared with Dragon dictation along with smaller phrase technology. Any transcriptional errors that result from this process are unintentional.

## 2021-11-29 ENCOUNTER — Ambulatory Visit: Payer: BC Managed Care – PPO | Admitting: Family Medicine

## 2021-11-29 VITALS — BP 100/60 | HR 78 | Ht 63.0 in | Wt 125.0 lb

## 2021-11-29 DIAGNOSIS — M9901 Segmental and somatic dysfunction of cervical region: Secondary | ICD-10-CM

## 2021-11-29 DIAGNOSIS — M9903 Segmental and somatic dysfunction of lumbar region: Secondary | ICD-10-CM

## 2021-11-29 DIAGNOSIS — M9908 Segmental and somatic dysfunction of rib cage: Secondary | ICD-10-CM | POA: Diagnosis not present

## 2021-11-29 DIAGNOSIS — M9905 Segmental and somatic dysfunction of pelvic region: Secondary | ICD-10-CM

## 2021-11-29 DIAGNOSIS — M5416 Radiculopathy, lumbar region: Secondary | ICD-10-CM

## 2021-11-29 DIAGNOSIS — M9904 Segmental and somatic dysfunction of sacral region: Secondary | ICD-10-CM

## 2021-11-29 DIAGNOSIS — M9902 Segmental and somatic dysfunction of thoracic region: Secondary | ICD-10-CM

## 2021-11-29 NOTE — Patient Instructions (Signed)
Awesome to see you  Can't wait to get input from other specialist  New exercise with the butt and heel on the wall.  30 reps daily each side See me again in 7-8 weeks

## 2021-11-29 NOTE — Assessment & Plan Note (Signed)
Mild increase in weakness noted of the gluteal area.  Attempted osteopathic manipulation.  Discussed taking the gabapentin again on a more regular basis.  Discussed hip abductor strengthening and avoid as much stretching as she has been doing.  I do feel that this will be more helpful.  Discussed which activities to do and which ones to avoid.  Follow-up again in 6 to 8 weeks

## 2021-12-02 ENCOUNTER — Telehealth: Payer: Self-pay | Admitting: Internal Medicine

## 2021-12-02 DIAGNOSIS — Z1231 Encounter for screening mammogram for malignant neoplasm of breast: Secondary | ICD-10-CM | POA: Diagnosis not present

## 2021-12-02 NOTE — Telephone Encounter (Signed)
Pt would like to transfer to dr crawford from dr Fabian Sharp due to patient  work schedule . Dr Fabian Sharp does not work on Friday

## 2021-12-04 NOTE — Telephone Encounter (Signed)
Ok with me 

## 2021-12-05 DIAGNOSIS — H02531 Eyelid retraction right upper eyelid: Secondary | ICD-10-CM | POA: Diagnosis not present

## 2021-12-05 DIAGNOSIS — H04123 Dry eye syndrome of bilateral lacrimal glands: Secondary | ICD-10-CM | POA: Diagnosis not present

## 2021-12-05 DIAGNOSIS — H2513 Age-related nuclear cataract, bilateral: Secondary | ICD-10-CM | POA: Diagnosis not present

## 2021-12-05 DIAGNOSIS — Z973 Presence of spectacles and contact lenses: Secondary | ICD-10-CM | POA: Diagnosis not present

## 2021-12-07 NOTE — Telephone Encounter (Signed)
Fine with me

## 2021-12-09 ENCOUNTER — Telehealth: Payer: Self-pay | Admitting: Internal Medicine

## 2021-12-09 MED ORDER — METHYLPHENIDATE HCL ER (OSM) 36 MG PO TBCR: 36.0000 mg | EXTENDED_RELEASE_TABLET | Freq: Every day | ORAL | 0 refills | Status: DC

## 2021-12-09 NOTE — Telephone Encounter (Signed)
Refill methylphenidate (CONCERTA) 36 MG PO CR tablet [id number] - [place], [place] - [address] AT [facility] [address] Phone: [phone]  Fax: [phone]

## 2021-12-09 NOTE — Telephone Encounter (Signed)
Lmom for pt to call back. 

## 2021-12-09 NOTE — Telephone Encounter (Signed)
Called patient lvm [facility]

## 2021-12-09 NOTE — Telephone Encounter (Signed)
Dr. Fabian Sharp patient.   Can you send in provider absent?  Provider will return on office on 12/12/21.  Last refill-11/10/21-30 tabs, 0 refill Last VV-11/10/21

## 2021-12-14 NOTE — Telephone Encounter (Signed)
Pt is aware ok to transfer to dr crawford she will call there office

## 2022-01-03 ENCOUNTER — Other Ambulatory Visit: Payer: Self-pay | Admitting: Adult Health

## 2022-01-03 ENCOUNTER — Telehealth: Payer: Self-pay | Admitting: Internal Medicine

## 2022-01-03 MED ORDER — METHYLPHENIDATE HCL ER (OSM) 36 MG PO TBCR: 36.0000 mg | EXTENDED_RELEASE_TABLET | Freq: Every day | ORAL | 0 refills | Status: DC

## 2022-01-03 NOTE — Telephone Encounter (Signed)
Pt call and stated she need a refill on methylphenidate (CONCERTA) 36 MG PO CR tablet sent to  [id number] - [place], [place] - [address] AT [facility] [address] Phone: [phone]  Fax: [phone]

## 2022-01-03 NOTE — Telephone Encounter (Signed)
Dr. Rosezella [place] patient.   Can you assist in provider's absent.  Last refill-12/09/21-30 tabs, 0 refills Last VV-10/19-23  Pharmacy updated

## 2022-01-09 ENCOUNTER — Encounter: Payer: BC Managed Care – PPO | Admitting: Internal Medicine

## 2022-01-09 DIAGNOSIS — L03012 Cellulitis of left finger: Secondary | ICD-10-CM | POA: Diagnosis not present

## 2022-01-09 NOTE — Telephone Encounter (Signed)
Pt is aware of medication sent to pharm

## 2022-01-10 NOTE — Progress Notes (Signed)
[name] [address] [place] [address] Phone: [phone] Subjective:    I'm seeing this patient by the request  of:  Panosh, [name], MD  CC: Neck pain, allover pain, back pain  [id number]  Terri [name] is a 56 y.o. female coming in with complaint of back and neck pain. OMT 11/29/2021. Patient states that she is good overall, 3rd finger nail split and now she has some redness that wont go away , had a clear jelly come out, 2 weeks ago ,still painful to the touch,  has been taking cephalix   Medications patient has been prescribed: Flagyl  Taking:         Reviewed prior external information including notes and imaging from previsou exam, outside providers and external EMR if available.   As well as notes that were available from care everywhere and other healthcare systems.  Past medical history, social, surgical and family history all reviewed in electronic medical record.  No pertanent information unless stated regarding to the chief complaint.   Past Medical History:  Diagnosis Date   Anxiety    Depression    Environmental allergies    GERD (gastroesophageal reflux disease)    Headache(784.0)    Recurrent sinusitis    ent and pulm eval in past.    Allergies  Allergen Reactions   Augmentin [Amoxicillin-Pot Clavulanate] Nausea Only   Celecoxib     REACTION: unspecified   Sulfa Antibiotics    Sulfonamide Derivatives     REACTION: hives, throat swelling   Monistat [Miconazole] Other (See Comments)    Sensitive to topical vaginal  Azoles No side effects with oral Diflucan     Review of Systems:  No headache, visual changes, nausea, vomiting, diarrhea, constipation, dizziness, abdominal pain, skin rash, fevers, chills, night sweats, weight loss, swollen lymph nodes, body aches, joint swelling, chest pain, shortness of breath, mood changes. POSITIVE muscle aches  Objective  Height 5\' 3"  (1.6 m), weight 125 lb (56.7  kg), last menstrual period 07/19/2017.   General: No apparent distress alert and oriented x3 mood and affect normal, dressed appropriately.  HEENT: Pupils equal, extraocular movements intact  Respiratory: Patient's speak in full sentences and does not appear short of breath  Cardiovascular: No lower extremity edema, non tender, no erythema  Patient's finger of the middle finger does show some mild erythema of the base of the nail noted.  This is consistent with paronychia Patient does have some mild scoliosis noted.  Patient does have hypermobility noted.  Tender to palpation in the paraspinal musculature of the whole axial skeletal today.  Patient does have actually increasing tightness with 07/21/2017 right greater than left.  Osteopathic findings  C3 flexed rotated and side bent right C5 flexed rotated and side bent right T3 extended rotated and side bent right inhaled rib T9 extended rotated and side bent left L2 flexed rotated and side bent right Sacrum right on right       Assessment and Plan:  No problem-specific Assessment & Plan notes found for this encounter.    Nonallopathic problems  Decision today to treat with OMT was based on Physical Exam  After verbal consent patient was treated with HVLA, ME, FPR techniques in cervical, rib, thoracic, lumbar, and sacral  areas  Patient tolerated the procedure well with improvement in symptoms  Patient given exercises, stretches and lifestyle modifications  See medications in patient instructions if given  Patient will follow up in 4-8 weeks  The above documentation has been reviewed and is accurate and complete Lyndal Pulley, DO         Note: This dictation was prepared with Dragon dictation along with smaller phrase technology. Any transcriptional errors that result from this process are unintentional.

## 2022-01-18 ENCOUNTER — Ambulatory Visit (INDEPENDENT_AMBULATORY_CARE_PROVIDER_SITE_OTHER): Payer: BC Managed Care – PPO | Admitting: Family Medicine

## 2022-01-18 VITALS — BP 120/68 | HR 77 | Ht 63.0 in | Wt 125.0 lb

## 2022-01-18 DIAGNOSIS — L03012 Cellulitis of left finger: Secondary | ICD-10-CM | POA: Diagnosis not present

## 2022-01-18 DIAGNOSIS — M9903 Segmental and somatic dysfunction of lumbar region: Secondary | ICD-10-CM | POA: Diagnosis not present

## 2022-01-18 DIAGNOSIS — M9904 Segmental and somatic dysfunction of sacral region: Secondary | ICD-10-CM

## 2022-01-18 DIAGNOSIS — M5030 Other cervical disc degeneration, unspecified cervical region: Secondary | ICD-10-CM

## 2022-01-18 DIAGNOSIS — M9902 Segmental and somatic dysfunction of thoracic region: Secondary | ICD-10-CM

## 2022-01-18 DIAGNOSIS — M9908 Segmental and somatic dysfunction of rib cage: Secondary | ICD-10-CM

## 2022-01-18 DIAGNOSIS — M9901 Segmental and somatic dysfunction of cervical region: Secondary | ICD-10-CM

## 2022-01-18 MED ORDER — DICLOFENAC SODIUM 75 MG PO TBEC: 75.0000 mg | DELAYED_RELEASE_TABLET | Freq: Every day | ORAL | 0 refills | Status: AC

## 2022-01-18 MED ORDER — DOXYCYCLINE HYCLATE 100 MG PO TABS: 100.0000 mg | ORAL_TABLET | Freq: Two times a day (BID) | ORAL | 0 refills | Status: DC

## 2022-01-18 NOTE — Patient Instructions (Addendum)
Doxycyline 100 two times a day  Diclofenac 3 pills take day  1 day 4 and day 7 6-8 week follow up

## 2022-01-18 NOTE — Assessment & Plan Note (Signed)
Patient does have the degenerative disc and the degenerative disc disease of the cervical and lumbar area.  Patient does have hypermobility noted.  Discussed with patient I still feel the underlying autoimmune could be there.  Patient is going to seek the rheumatologist and is scheduled for next week.  Follow-up with me again 6 to 8 weeks

## 2022-01-18 NOTE — Assessment & Plan Note (Signed)
Doxycycline prescribed today.  Warned if any streaking to seek medical attention

## 2022-01-30 DIAGNOSIS — M9903 Segmental and somatic dysfunction of lumbar region: Secondary | ICD-10-CM | POA: Diagnosis not present

## 2022-01-30 DIAGNOSIS — M9905 Segmental and somatic dysfunction of pelvic region: Secondary | ICD-10-CM | POA: Diagnosis not present

## 2022-01-30 DIAGNOSIS — M9902 Segmental and somatic dysfunction of thoracic region: Secondary | ICD-10-CM | POA: Diagnosis not present

## 2022-01-30 DIAGNOSIS — M5386 Other specified dorsopathies, lumbar region: Secondary | ICD-10-CM | POA: Diagnosis not present

## 2022-02-01 NOTE — Progress Notes (Signed)
Office Visit Note  Patient: Terri [name]             Date of Birth: 02-21-1965           MRN: [id number]             PCP: Madelin Headings, MD Referring: Judi Saa, DO Visit Date: 02/02/2022 Occupation: Aesthetician  Subjective:  New Patient (Initial Visit)   History of Present Illness: Terri [name] is a 57 y.o. female here for evaluation of positive ANA association for multiple ongoing symptoms concerning for inflammatory or autoimmune process.  Problems have been going on for years initially with more extensive workup for problems after she suffered a accident with injury worsening neck and back arthritis problems and 2017. She has ongoing multilevel back pain on treatment with OMT manipulations with some improvement.  Is been some progressive worsening of nodules on the finger joints of both hands particularly in her thumbs and the distal finger joints.  She sees swelling in the left third finger associated with some fingernail changes otherwise does not usually see swelling along with her joint pain.  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 She has had slipped rib, some scapular dislocation, and left hip labral tear but no other history of recurrent joint injuries associated with this. She is taking a variety of NSAID medications including at least meloxicam diclofenac and Celebrex over time has had some partial symptom improvement.  Tolerates gabapentin at night but did not tolerate trial of Cymbalta medication due to the tiredness and sedation type side effects. Does tolerate low dose lyrica.  She takes Concerta for ADHD she describes some increase in her finger joint pains when on this medication. Aside joint problems she has longstanding history of allergy to numerous substances mostly contact dermatitis problems. She chronically experiences dermatographic urticaria but does not have hives most the time or  diffuse skin involvement and only takes antihistamine medications occasionally as needed. She has eye dryness recently had punctal plugs with ophthalmology and uses systane drops for this. Her doctor expressed some concern for what sounds like mild proptosis as a potential contributor. She denies any problem with hair loss, lymphadenopathy, mouth dryness, oral or nasal ulcers, Raynaud's syndrome, abnormal bruising or bleeding, or history of blood clots. Her mother has significant osteoarthritis and has hypothyroidism but no specific autoimmune disease family history.   Labs reviewed 06/2015 ANA 1:640 homogenous  Activities of Daily Living:  Patient reports morning stiffness for 0 minute.   Patient Denies nocturnal pain.  Difficulty dressing/grooming: Denies Difficulty climbing stairs: Reports Difficulty getting out of chair: Reports Difficulty using hands for taps, buttons, cutlery, and/or writing: Denies  Review of Systems  Constitutional:  Negative for fatigue.  HENT:  Negative for mouth sores and mouth dryness.   Eyes:  Positive for dryness.  Respiratory:  Negative for shortness of breath.   Cardiovascular:  Negative for chest pain and palpitations.  Gastrointestinal:  Negative for blood in stool, constipation and diarrhea.  Endocrine: Negative for increased urination.  Genitourinary:  Negative for involuntary urination.  Musculoskeletal:  Positive for joint pain and joint pain. Negative for gait problem, joint swelling, myalgias, muscle weakness, morning stiffness, muscle tenderness and myalgias.  Skin:  Negative for color change, rash, hair loss and sensitivity to sunlight.  Allergic/Immunologic: Negative for susceptible to infections.  Neurological:  Negative for dizziness and headaches.  Hematological:  Negative for swollen glands.  Psychiatric/Behavioral:  Negative for depressed mood and sleep disturbance. The patient is not nervous/anxious.     PMFS History:  Patient Active  Problem List   Diagnosis Date Noted   Bilateral hand pain 02/02/2022   Abnormal laboratory test result 02/02/2022   Dry eye 02/02/2022   Paronychia of finger of left hand 01/18/2022   Hematuria 10/18/2021   Abdominal pain, left lower quadrant 08/18/2021   Left shoulder pain 06/14/2021   Bursitis of foot 04/14/2021   Degenerative disc disease, cervical 03/03/2020   Right anterior knee pain 11/24/2019   Nonallopathic lesion of cervical region 07/08/2019   Abnormality of gait 10/25/2017   Nonallopathic lesion of pelvic region 08/24/2017   Acute bursitis of right shoulder 08/02/2017   Lateral epicondylitis, left elbow 04/23/2017   Lumbar radicular pain 11/21/2016   Intractable headache 07/04/2016   Concussion 05/30/2016   Compression fracture of body of thoracic vertebra (HCC) 05/30/2016   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   Nonallopathic lesion of rib cage 11/15/2015   Positive ANA (antinuclear antibody) 08/02/2015   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   SI (sacroiliac) joint dysfunction 06/10/2015   Nonallopathic lesion of sacral region 06/10/2015   Nonallopathic lesion of lumbosacral region 06/10/2015   Nonallopathic lesion of thoracic region 06/10/2015   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   History of migraine headaches 02/16/2014   Medication management 06/24/2013   SINUSITIS, ACUTE 12/16/2012   Adjustment reaction with anxiety and depression 01/02/2012   Sciatica 01/02/2012   Fam hx-ischem heart disease 11/21/2011   Other and unspecified hyperlipidemia 11/21/2011   Medication side effect 11/21/2011   Recurrent sinusitis    EAR PAIN, BILATERAL 02/18/2009   Arthropathy of pelvic region and thigh 02/18/2009   NECK PAIN 02/18/2009   OTHER CHRONIC SINUSITIS 09/15/2008   DERMATITIS, PERIORAL 10/04/2007   RHINITIS 04/04/2007   G E R D 03/06/2007   URTICARIA 03/06/2007   ALLERGY 10/02/2006   ANXIETY 08/24/2006   Anxiety and depression 08/24/2006    HEADACHE 08/24/2006    Past Medical History:  Diagnosis Date   Anxiety    Depression    Environmental allergies    GERD (gastroesophageal reflux disease)    Headache(784.0)    Recurrent sinusitis    ent and pulm eval in past.    Family History  Problem Relation Age of Onset   Heart disease Other        fhx   Past Surgical History:  Procedure Laterality Date   HIP ARTHROSCOPY W/ LABRAL DEBRIDEMENT     left hip surgery     [facility]   Social History   Social History Narrative   esthetician   Non smoker   HH of 1    Immunization History  Administered Date(s) Administered   PFIZER(Purple Top)SARS-COV-2 Vaccination 04/20/2019, 05/11/2019     Objective: Vital Signs: BP 112/77 (BP Location: Left Arm, Patient Position: Sitting, Cuff Size: Normal)   Pulse 67   Resp 14   Ht 5\' 3"  (1.6 m)   Wt 127 lb (57.6 kg)   LMP 07/19/2017 (Approximate)   BMI 22.50 kg/m    Physical Exam HENT:     Mouth/Throat:     Mouth: Mucous membranes are moist.     Pharynx: Oropharynx is clear.  Eyes:     Conjunctiva/sclera: Conjunctivae normal.  Cardiovascular:     Rate and Rhythm: Normal rate and regular rhythm.  Pulmonary:     Effort: Pulmonary effort is normal.     Breath sounds: Normal  breath sounds.  Lymphadenopathy:     Cervical: No cervical adenopathy.  Skin:    General: Skin is warm and dry.     Findings: No rash.  Neurological:     Mental Status: She is alert.  Psychiatric:        Mood and Affect: Mood normal.      Musculoskeletal Exam:  Shoulders full ROM no tenderness or swelling Elbows mild bilateral hyperextensibility, no tenderness or swelling Wrists full ROM no tenderness or swelling Fingers full ROM, slight tenderness to pressure at 1st MCP and CMC, heberdon's nodes throughout DIP joint of both hands, left 3rd finger swelling at the base of the fingernail with longitudinal ridge Hip normal internal and external rotation without pain, no tenderness to lateral hip  palpation Knees full ROM patella lateral mobility appears greater than normal but no pain and negative grind test, no joint laxity Ankles full ROM no tenderness or swelling   Investigation: No additional findings.  Imaging: XR Hand 2 View Left  Result Date: 02/02/2022 X-ray left hand 2 views Radiocarpal joint space appears normal.  Minimal degenerative change at the first [facility] joint.  There is a small osteophyte process and periarticular calcification at the first IP joint.  MCP and PIP joints appear normal.  3rd DIP joint with mild asymmetric joint space narrowing and dorsal osteophyte. No erosions seen.  Bone mineralization appears normal. Impression Mild appearing distal osteoarthritis  XR Hand 2 View Right  Result Date: 02/02/2022 X-ray right hand 2 views Radiocarpal joint space appears normal.  Minimal degenerative change at the first [facility] joint.  There is osteophyte process and peritubular calcification at the first IP joint.  MCP and PIP joints appear normal.  There are joint calcifications present at 2nd DIP joint.  No erosions seen.  Bone mineralization appears normal. Impression Mild appearing distal joint osteoarthritis, with small periarticular joint calcifications   Recent Labs: Lab Results  Component Value Date   WBC 6.7 08/18/2021   HGB 14.1 08/18/2021   PLT 244.0 08/18/2021   NA 137 08/18/2021   K 4.4 08/18/2021   CL 100 08/18/2021   CO2 29 08/18/2021   GLUCOSE 83 08/18/2021   BUN 10 08/18/2021   CREATININE 0.87 08/18/2021   BILITOT 0.7 08/18/2021   ALKPHOS 59 08/18/2021   AST 22 08/18/2021   ALT 17 08/18/2021   PROT 7.9 08/18/2021   ALBUMIN 4.7 08/18/2021   CALCIUM 10.1 08/18/2021    Speciality Comments: No specialty comments available.  Procedures:  No procedures performed Allergies: Augmentin [amoxicillin-pot clavulanate], Celecoxib, Sulfa antibiotics, Sulfonamide derivatives, and Monistat [miconazole]   Assessment / Plan:     Visit Diagnoses: Positive  ANA (antinuclear antibody) - Plan: Sedimentation rate, C-reactive protein, Anti-scleroderma antibody, RNP Antibody, Anti-Smith antibody, Sjogrens syndrome-A extractable nuclear antibody, Sjogrens syndrome-B extractable nuclear antibody, Anti-DNA antibody, double-stranded, C3 and C4  Positive ANA mostly nonspecific symptoms joint pain may be explained by a mechanical issue with some previous injury and mild peripheral joint osteoarthritis and some nonallopathic Dr. Tamala Julian.  Will check specific antibody panel today.  Her eye doctor reported concern for dry eye and possible mild proptosis also raises question for other etiologies such as thyroid disease consider evaluation for IgG4 related disease there are no other symptoms at this time.  Chronic urticaria can be associated with positive ANA as well fairly often without a specific connective tissue disease identified.  URTICARIA Dermatographism - Plan: Tryptase  Urticarial rashes no features concerning for urticarial vasculitis process.  Dermatographia  some is more strongly associated with physical urticaria most commonly systemic inflammatory process underlying.  Will also check a serum tryptase for any elevations suggesting mast cell activation  Paronychia of finger of left hand Bilateral hand pain - Plan: XR Hand 2 View Right, XR Hand 2 View Left  History of mild tenderness to pressure some Heberden's nodes and first CMC joint osteoarthritic changes without any peripheral joint synovitis appreciated.  Possible digital mucinous cyst at the third DIP changes and with limited response to the oral doxycycline.  X-ray of the hand shows mostly mild appearing osteoarthritis there are some periarticular calcification present on the right hand.  Abnormal laboratory test result - Plan: Thyroid Peroxidase Antibodies (TPO) (REFL), Thyroglobulin antibody  Also question about possible thyroid disease of significant hypothyroidism.  Will check for thyroid  peroxidase and thyroglobulin antibodies with positive ANA as well.  Benign hypermobility syndrome  There is mild joint hyperextensibility in a few areas no history of recurrent dislocations or symptoms concerning for a major collagen vascular disorder.  Orders: Orders Placed This Encounter  Procedures   XR Hand 2 View Right   XR Hand 2 View Left   Sedimentation rate   C-reactive protein   Tryptase   Anti-scleroderma antibody   RNP Antibody   Anti-Smith antibody   Sjogrens syndrome-A extractable nuclear antibody   Sjogrens syndrome-B extractable nuclear antibody   Anti-DNA antibody, double-stranded   C3 and C4   Thyroid Peroxidase Antibodies (TPO) (REFL)   Thyroglobulin antibody   No orders of the defined types were placed in this encounter.    Follow-Up Instructions: Return in about 3 weeks (around 02/23/2022) for New pt +ANA/CU/OA f/u 3wks.   Collier Salina, MD  Note - This record has been created using [employer].  Chart creation errors have been sought, but may not always  have been located. Such creation errors do not reflect on  the standard of medical care.

## 2022-02-02 ENCOUNTER — Ambulatory Visit (INDEPENDENT_AMBULATORY_CARE_PROVIDER_SITE_OTHER): Payer: BC Managed Care – PPO

## 2022-02-02 ENCOUNTER — Encounter: Payer: Self-pay | Admitting: Internal Medicine

## 2022-02-02 ENCOUNTER — Ambulatory Visit: Payer: BC Managed Care – PPO | Attending: Internal Medicine | Admitting: Internal Medicine

## 2022-02-02 VITALS — BP 112/77 | HR 67 | Resp 14 | Ht 63.0 in | Wt 127.0 lb

## 2022-02-02 DIAGNOSIS — R899 Unspecified abnormal finding in specimens from other organs, systems and tissues: Secondary | ICD-10-CM | POA: Diagnosis not present

## 2022-02-02 DIAGNOSIS — M79642 Pain in left hand: Secondary | ICD-10-CM

## 2022-02-02 DIAGNOSIS — M79641 Pain in right hand: Secondary | ICD-10-CM

## 2022-02-02 DIAGNOSIS — L503 Dermatographic urticaria: Secondary | ICD-10-CM

## 2022-02-02 DIAGNOSIS — L509 Urticaria, unspecified: Secondary | ICD-10-CM

## 2022-02-02 DIAGNOSIS — R768 Other specified abnormal immunological findings in serum: Secondary | ICD-10-CM

## 2022-02-02 DIAGNOSIS — M357 Hypermobility syndrome: Secondary | ICD-10-CM

## 2022-02-02 DIAGNOSIS — L03012 Cellulitis of left finger: Secondary | ICD-10-CM

## 2022-02-02 DIAGNOSIS — H04129 Dry eye syndrome of unspecified lacrimal gland: Secondary | ICD-10-CM | POA: Insufficient documentation

## 2022-02-05 ENCOUNTER — Other Ambulatory Visit: Payer: Self-pay | Admitting: Internal Medicine

## 2022-02-06 DIAGNOSIS — M9905 Segmental and somatic dysfunction of pelvic region: Secondary | ICD-10-CM | POA: Diagnosis not present

## 2022-02-06 DIAGNOSIS — M5386 Other specified dorsopathies, lumbar region: Secondary | ICD-10-CM | POA: Diagnosis not present

## 2022-02-06 DIAGNOSIS — M9903 Segmental and somatic dysfunction of lumbar region: Secondary | ICD-10-CM | POA: Diagnosis not present

## 2022-02-06 DIAGNOSIS — M9902 Segmental and somatic dysfunction of thoracic region: Secondary | ICD-10-CM | POA: Diagnosis not present

## 2022-02-06 LAB — ANTI-DNA ANTIBODY, DOUBLE-STRANDED: ds DNA Ab: <1 IU/mL

## 2022-02-06 LAB — SJOGRENS SYNDROME-B EXTRACTABLE NUCLEAR ANTIBODY: SSB (La) (ENA) Antibody, IgG: <1 AI

## 2022-02-06 LAB — TRYPTASE: Tryptase: 8.1 mcg/L (ref <11.0)

## 2022-02-06 LAB — C3 AND C4
C3 Complement: 127 mg/dL (ref 83–193)
C4 Complement: 21 mg/dL (ref 15–57)

## 2022-02-06 LAB — ANTI-SMITH ANTIBODY: ENA SM Ab Ser-aCnc: <1 AI

## 2022-02-06 LAB — THYROGLOBULIN ANTIBODY: Thyroglobulin Ab: 30 IU/mL — ABNORMAL HIGH (ref <=1)

## 2022-02-06 LAB — SJOGRENS SYNDROME-A EXTRACTABLE NUCLEAR ANTIBODY: SSA (Ro) (ENA) Antibody, IgG: <1 AI

## 2022-02-06 LAB — C-REACTIVE PROTEIN: CRP: 0.9 mg/L (ref <8.0)

## 2022-02-06 LAB — ANTI-SCLERODERMA ANTIBODY: Scleroderma (Scl-70) (ENA) Antibody, IgG: <1 AI

## 2022-02-06 LAB — RNP ANTIBODY: Ribonucleic Protein(ENA) Antibody, IgG: <1 AI

## 2022-02-06 LAB — THYROID PEROXIDASE ANTIBODIES (TPO) (REFL): Thyroperoxidase Ab SerPl-aCnc: 490 IU/mL — ABNORMAL HIGH (ref <9)

## 2022-02-06 LAB — SEDIMENTATION RATE: Sed Rate: 19 mm/h (ref 0–30)

## 2022-02-13 DIAGNOSIS — M5386 Other specified dorsopathies, lumbar region: Secondary | ICD-10-CM | POA: Diagnosis not present

## 2022-02-13 DIAGNOSIS — M9905 Segmental and somatic dysfunction of pelvic region: Secondary | ICD-10-CM | POA: Diagnosis not present

## 2022-02-13 DIAGNOSIS — M9903 Segmental and somatic dysfunction of lumbar region: Secondary | ICD-10-CM | POA: Diagnosis not present

## 2022-02-13 DIAGNOSIS — M9902 Segmental and somatic dysfunction of thoracic region: Secondary | ICD-10-CM | POA: Diagnosis not present

## 2022-02-20 DIAGNOSIS — Z124 Encounter for screening for malignant neoplasm of cervix: Secondary | ICD-10-CM | POA: Diagnosis not present

## 2022-02-20 DIAGNOSIS — Z6823 Body mass index (BMI) 23.0-23.9, adult: Secondary | ICD-10-CM | POA: Diagnosis not present

## 2022-02-20 DIAGNOSIS — Z01419 Encounter for gynecological examination (general) (routine) without abnormal findings: Secondary | ICD-10-CM | POA: Diagnosis not present

## 2022-02-20 DIAGNOSIS — Z1151 Encounter for screening for human papillomavirus (HPV): Secondary | ICD-10-CM | POA: Diagnosis not present

## 2022-02-21 DIAGNOSIS — M9905 Segmental and somatic dysfunction of pelvic region: Secondary | ICD-10-CM | POA: Diagnosis not present

## 2022-02-21 DIAGNOSIS — M9902 Segmental and somatic dysfunction of thoracic region: Secondary | ICD-10-CM | POA: Diagnosis not present

## 2022-02-21 DIAGNOSIS — M9903 Segmental and somatic dysfunction of lumbar region: Secondary | ICD-10-CM | POA: Diagnosis not present

## 2022-02-21 DIAGNOSIS — M5386 Other specified dorsopathies, lumbar region: Secondary | ICD-10-CM | POA: Diagnosis not present

## 2022-02-22 NOTE — Progress Notes (Deleted)
  [place] Phone: [phone] Subjective:    I'm seeing this patient by the request  of:  Panosh, Standley Brooking, MD  CC: Neck and back pain follow-up  [id number]  Terri [name] is a 57 y.o. female coming in with complaint of back and neck pain. OTM 01/18/2022.  After years of suggesting patient finally went to rheumatology.  She did see them on January 11.  Also f/u for L hand infection. Patient states   Medications patient has been prescribed: Doxy  Taking:  Patient did have labs done at rheumatology showing that patient does have a thyroglobulin antibody of 30 as well as a thyroid peroxidase antibody of 490       Reviewed prior external information including notes and imaging from previsou exam, outside providers and external EMR if available.   As well as notes that were available from care everywhere and other healthcare systems.  Past medical history, social, surgical and family history all reviewed in electronic medical record.  No pertanent information unless stated regarding to the chief complaint.   Past Medical History:  Diagnosis Date   Anxiety    Depression    Environmental allergies    GERD (gastroesophageal reflux disease)    Headache(784.0)    Recurrent sinusitis    ent and pulm eval in past.    Allergies  Allergen Reactions   Augmentin [Amoxicillin-Pot Clavulanate] Nausea Only   Celecoxib     REACTION: unspecified   Sulfa Antibiotics    Sulfonamide Derivatives     REACTION: hives, throat swelling   Monistat [Miconazole] Other (See Comments)    Sensitive to topical vaginal  Azoles No side effects with oral Diflucan     Review of Systems:  No headache, visual changes, nausea, vomiting, diarrhea, constipation, dizziness, abdominal pain, skin rash, fevers, chills, night sweats, weight loss, swollen lymph nodes, body aches, joint swelling, chest pain, shortness of breath, mood changes.  POSITIVE muscle aches  Objective  Last menstrual period 07/19/2017.   General: No apparent distress alert and oriented x3 mood and affect normal, dressed appropriately.  HEENT: Pupils equal, extraocular movements intact  Respiratory: Patient's speak in full sentences and does not appear short of breath  Cardiovascular: No lower extremity edema, non tender, no erythema  Gait MSK:  Back   Osteopathic findings  C2 flexed rotated and side bent right C6 flexed rotated and side bent left T3 extended rotated and side bent right inhaled rib T9 extended rotated and side bent left L2 flexed rotated and side bent right Sacrum right on right       Assessment and Plan:  No problem-specific Assessment & Plan notes found for this encounter.    Nonallopathic problems  Decision today to treat with OMT was based on Physical Exam  After verbal consent patient was treated with HVLA, ME, FPR techniques in cervical, rib, thoracic, lumbar, and sacral  areas  Patient tolerated the procedure well with improvement in symptoms  Patient given exercises, stretches and lifestyle modifications  See medications in patient instructions if given  Patient will follow up in 4-8 weeks     The above documentation has been reviewed and is accurate and complete Lyndal Pulley, DO         Note: This dictation was prepared with Dragon dictation along with smaller phrase technology. Any transcriptional errors that result from this process are unintentional.

## 2022-02-23 ENCOUNTER — Ambulatory Visit: Payer: BC Managed Care – PPO | Admitting: Family Medicine

## 2022-03-03 ENCOUNTER — Other Ambulatory Visit: Payer: Self-pay | Admitting: Family

## 2022-03-03 ENCOUNTER — Telehealth: Payer: Self-pay | Admitting: Internal Medicine

## 2022-03-03 MED ORDER — METHYLPHENIDATE HCL ER (OSM) 36 MG PO TBCR: 36.0000 mg | EXTENDED_RELEASE_TABLET | Freq: Every day | ORAL | 0 refills | Status: DC

## 2022-03-03 NOTE — Telephone Encounter (Signed)
Prescription Request  03/03/2022  Is this a "Controlled Substance" medicine? Yes  LOV: Visit date not found  What is the name of the medication or equipment? methylphenidate (CONCERTA) 36 MG PO CR tablet   Have you contacted your pharmacy to request a refill? No   Which pharmacy would you like this sent to?  [id number] [place], [place] - [address] [place]Phone: [phone]: [phone]   Patient notified that their request is being sent to the clinical staff for review and that they should receive a response within 2 business days.   Please advise at Mobile [phone](mobile)

## 2022-03-06 DIAGNOSIS — M9903 Segmental and somatic dysfunction of lumbar region: Secondary | ICD-10-CM | POA: Diagnosis not present

## 2022-03-06 DIAGNOSIS — M9902 Segmental and somatic dysfunction of thoracic region: Secondary | ICD-10-CM | POA: Diagnosis not present

## 2022-03-06 DIAGNOSIS — M5386 Other specified dorsopathies, lumbar region: Secondary | ICD-10-CM | POA: Diagnosis not present

## 2022-03-06 DIAGNOSIS — M9905 Segmental and somatic dysfunction of pelvic region: Secondary | ICD-10-CM | POA: Diagnosis not present

## 2022-03-06 NOTE — Progress Notes (Signed)
Office Visit Note  Patient: Terri [name]             Date of Birth: 01/19/1966           MRN: [id number]             PCP: Burnis Medin, MD Referring: Burnis Medin, MD Visit Date: 03/07/2022   Subjective:  Follow-up   History of Present Illness: Terri [name] is a 57 y.o. female here for follow up with abnormal labs associated with joint pain in multiple areas, skin rashes, and eye irritation from possible inflammation or periorbital swelling.  Lab test at initial visit were moderately positive for thyroid peroxidase and thyroglobulin antibodies but with normal thyroid function panel and other inflammatory markers and antibody markers all negative.  X-rays of bilateral hands to evaluate the nodules showed mild distal joint osteoarthritis changes with a few small periarticular calcifications no other abnormal changes or erosions.  Previous HPI 02/02/22 [name] is a 57 y.o. female here for evaluation of positive ANA association for multiple ongoing symptoms concerning for inflammatory or autoimmune process.  Problems have been going on for years initially with more extensive workup for problems after she suffered a accident with injury worsening neck and back arthritis problems and 2017. She has ongoing multilevel back pain on treatment with OMT manipulations with some improvement.  Is been some progressive worsening of nodules on the finger joints of both hands particularly in her thumbs and the distal finger joints.  She sees swelling in the left third finger associated with some fingernail changes otherwise does not usually see swelling along with her joint pain.  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 She has had slipped rib, some scapular dislocation, and left hip labral tear but no other history of recurrent joint injuries associated with this. She is taking a variety of NSAID medications including at least  meloxicam diclofenac and Celebrex over time has had some partial symptom improvement.  Tolerates gabapentin at night but did not tolerate trial of Cymbalta medication due to the tiredness and sedation type side effects. Does tolerate low dose lyrica.  She takes Concerta for ADHD she describes some increase in her finger joint pains when on this medication. Aside joint problems she has longstanding history of allergy to numerous substances mostly contact dermatitis problems. She chronically experiences dermatographic urticaria but does not have hives most the time or diffuse skin involvement and only takes antihistamine medications occasionally as needed. She has eye dryness recently had punctal plugs with ophthalmology and uses systane drops for this. Her doctor expressed some concern for what sounds like mild proptosis as a potential contributor. She denies any problem with hair loss, lymphadenopathy, mouth dryness, oral or nasal ulcers, Raynaud's syndrome, abnormal bruising or bleeding, or history of blood clots. Her mother has significant osteoarthritis and has hypothyroidism but no specific autoimmune disease family history.     Labs reviewed 06/2015 ANA 1:640 homogenous   Review of Systems  Constitutional:  Positive for fatigue.  HENT:  Negative for mouth sores and mouth dryness.   Eyes:  Positive for dryness.  Respiratory:  Negative for shortness of breath.   Cardiovascular:  Positive for palpitations. Negative for chest pain.  Gastrointestinal:  Negative for blood in stool, constipation and diarrhea.  Endocrine: Negative for increased urination.  Genitourinary:  Negative for involuntary urination.  Musculoskeletal:  Positive for joint pain, joint pain, joint swelling and morning  stiffness. Negative for gait problem, myalgias, muscle weakness, muscle tenderness and myalgias.  Skin:  Negative for color change, rash, hair loss and sensitivity to sunlight.  Allergic/Immunologic: Positive for  susceptible to infections.  Neurological:  Negative for dizziness and headaches.  Hematological:  Negative for swollen glands.  Psychiatric/Behavioral:  Positive for depressed mood. Negative for sleep disturbance. The patient is nervous/anxious.     PMFS History:  Patient Active Problem List   Diagnosis Date Noted   Osteoarthritis of hands, bilateral 03/07/2022   Bilateral hand pain 02/02/2022   Abnormal laboratory test result 02/02/2022   Dry eye 02/02/2022   Paronychia of finger of left hand 01/18/2022   Hematuria 10/18/2021   Abdominal pain, left lower quadrant 08/18/2021   Left shoulder pain 06/14/2021   Bursitis of foot 04/14/2021   Degenerative disc disease, cervical 03/03/2020   Right anterior knee pain 11/24/2019   Nonallopathic lesion of cervical region 07/08/2019   Abnormality of gait 10/25/2017   Nonallopathic lesion of pelvic region 08/24/2017   Acute bursitis of right shoulder 08/02/2017   Lateral epicondylitis, left elbow 04/23/2017   Lumbar radicular pain 11/21/2016   Intractable headache 07/04/2016   Concussion 05/30/2016   Compression fracture of body of thoracic vertebra (HCC) 05/30/2016   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   Nonallopathic lesion of rib cage 11/15/2015   Positive ANA (antinuclear antibody) 08/02/2015   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   SI (sacroiliac) joint dysfunction 06/10/2015   Nonallopathic lesion of sacral region 06/10/2015   Nonallopathic lesion of lumbosacral region 06/10/2015   Nonallopathic lesion of thoracic region 06/10/2015   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   History of migraine headaches 02/16/2014   Medication management 06/24/2013   SINUSITIS, ACUTE 12/16/2012   Adjustment reaction with anxiety and depression 01/02/2012   Sciatica 01/02/2012   Fam hx-ischem heart disease 11/21/2011   Other and unspecified hyperlipidemia 11/21/2011   Medication side effect 11/21/2011   Recurrent sinusitis     EAR PAIN, BILATERAL 02/18/2009   Arthropathy of pelvic region and thigh 02/18/2009   NECK PAIN 02/18/2009   OTHER CHRONIC SINUSITIS 09/15/2008   DERMATITIS, PERIORAL 10/04/2007   RHINITIS 04/04/2007   G E R D 03/06/2007   URTICARIA 03/06/2007   ALLERGY 10/02/2006   ANXIETY 08/24/2006   Anxiety and depression 08/24/2006   HEADACHE 08/24/2006    Past Medical History:  Diagnosis Date   Anxiety    Depression    Environmental allergies    GERD (gastroesophageal reflux disease)    Headache(784.0)    Recurrent sinusitis    ent and pulm eval in past.    Family History  Problem Relation Age of Onset   Diabetes Father    Heart attack Father    Leukemia Sister    Heart disease Other        fhx   Past Surgical History:  Procedure Laterality Date   HIP ARTHROSCOPY W/ LABRAL DEBRIDEMENT Left    At [facility]   Social History   Social History Narrative   esthetician   Non smoker   HH of 1    Immunization History  Administered Date(s) Administered   PFIZER(Purple Top)SARS-COV-2 Vaccination 04/20/2019, 05/11/2019     Objective: Vital Signs: BP 114/77 (BP Location: Left Arm, Patient Position: Sitting, Cuff Size: Small)   Pulse 74   Resp 16   Ht '5\' 3"'$  (1.6 m)   Wt 125 lb (56.7 kg)   LMP 07/19/2017 (Approximate)   BMI 22.14 kg/m  Physical Exam Eyes:     Conjunctiva/sclera: Conjunctivae normal.  Cardiovascular:     Rate and Rhythm: Normal rate and regular rhythm.  Pulmonary:     Effort: Pulmonary effort is normal.     Breath sounds: Normal breath sounds.  Lymphadenopathy:     Cervical: No cervical adenopathy.  Skin:    General: Skin is warm and dry.     Findings: No rash.  Neurological:     Mental Status: She is alert.  Psychiatric:        Mood and Affect: Mood normal.      Musculoskeletal Exam:  Shoulders full ROM no tenderness or swelling Elbows full ROM no tenderness or swelling Wrists full ROM no tenderness or swelling Fingers full ROM mild bony  nodular DIP joints, left third finger with slight swelling or raised area at base of nail Hip normal internal and external rotation without pain, no tenderness to lateral hip palpation Knees full ROM no tenderness or swelling   Investigation: No additional findings.  Imaging: No results found.  Recent Labs: Lab Results  Component Value Date   WBC 6.7 08/18/2021   HGB 14.1 08/18/2021   PLT 244.0 08/18/2021   NA 137 08/18/2021   K 4.4 08/18/2021   CL 100 08/18/2021   CO2 29 08/18/2021   GLUCOSE 83 08/18/2021   BUN 10 08/18/2021   CREATININE 0.87 08/18/2021   BILITOT 0.7 08/18/2021   ALKPHOS 59 08/18/2021   AST 22 08/18/2021   ALT 17 08/18/2021   PROT 7.9 08/18/2021   ALBUMIN 4.7 08/18/2021   CALCIUM 10.1 08/18/2021    Speciality Comments: No specialty comments available.  Procedures:  No procedures performed Allergies: Augmentin [amoxicillin-pot clavulanate], Celecoxib, Sulfa antibiotics, Sulfonamide derivatives, and Monistat [miconazole]   Assessment / Plan:     Visit Diagnoses: Positive ANA (antinuclear antibody) Abnormal laboratory test result - Plan: Thyroid Panel With TSH, IGG SUBCLASS 4, IgG, IgA, IgM  Appears most likely the positive ANA result is associated with the moderately high positive antithyroid antibody tests.  Do not see any specific other systemic connective tissue disease supported by results so far.  Will check thyroid function panel for evidence of clinical significance from these antibodies.  Graves' ophthalmopathy would be unlikely without some change in activity function.  Hashimoto's thyroiditis is possible with euthyroid values but would not put on hormone treatment anyways in that case.  If abnormal would refer to endocrinology.  Will also check quantitative immunoglobulins and IgG subclass 4 level to rule out other underlying cause.  Benign hypermobility syndrome  No significant new joint injury.  Recommend she continue following with Dr. Tamala Julian  for management.  Dry eye  Continue follow-up with ophthalmology no evidence of primary Sjogren's syndrome by antibody testing. Thyroid and IgG4 testing as above.  Primary osteoarthritis of both hands  Provided some recommendations for trial of local therapy or supplement treatment options for osteoarthritis symptoms of both hands.  No particular changes of inflammatory arthritis found.  Orders: Orders Placed This Encounter  Procedures   Thyroid Panel With TSH   IGG SUBCLASS 4   IgG, IgA, IgM   No orders of the defined types were placed in this encounter.    Follow-Up Instructions: No follow-ups on file.   Collier Salina, MD  Note - This record has been created using [employer].  Chart creation errors have been sought, but may not always  have been located. Such creation errors do not reflect on  the standard  of medical care.

## 2022-03-07 ENCOUNTER — Encounter: Payer: Self-pay | Admitting: Internal Medicine

## 2022-03-07 ENCOUNTER — Ambulatory Visit: Payer: BC Managed Care – PPO | Attending: Internal Medicine | Admitting: Internal Medicine

## 2022-03-07 VITALS — BP 114/77 | HR 74 | Resp 16 | Ht 63.0 in | Wt 125.0 lb

## 2022-03-07 DIAGNOSIS — M357 Hypermobility syndrome: Secondary | ICD-10-CM

## 2022-03-07 DIAGNOSIS — H04129 Dry eye syndrome of unspecified lacrimal gland: Secondary | ICD-10-CM | POA: Diagnosis not present

## 2022-03-07 DIAGNOSIS — R768 Other specified abnormal immunological findings in serum: Secondary | ICD-10-CM | POA: Diagnosis not present

## 2022-03-07 DIAGNOSIS — M19042 Primary osteoarthritis, left hand: Secondary | ICD-10-CM

## 2022-03-07 DIAGNOSIS — M19041 Primary osteoarthritis, right hand: Secondary | ICD-10-CM

## 2022-03-07 DIAGNOSIS — R899 Unspecified abnormal finding in specimens from other organs, systems and tissues: Secondary | ICD-10-CM

## 2022-03-07 NOTE — Patient Instructions (Signed)

## 2022-03-10 LAB — IGG, IGA, IGM
IgG (Immunoglobin G), Serum: 959 mg/dL (ref 600–1640)
IgM, Serum: 88 mg/dL (ref 50–300)
Immunoglobulin A: 347 mg/dL — ABNORMAL HIGH (ref 47–310)

## 2022-03-10 LAB — THYROID PANEL WITH TSH
Free Thyroxine Index: 2 (ref 1.4–3.8)
T3 Uptake: 33 % (ref 22–35)
T4, Total: 6 ug/dL (ref 5.1–11.9)
TSH: 2.97 mIU/L (ref 0.40–4.50)

## 2022-03-10 LAB — IGG SUBCLASS 4: IgG Subclass 4: 43.9 mg/dL (ref 4.0–86.0)

## 2022-03-13 DIAGNOSIS — M9905 Segmental and somatic dysfunction of pelvic region: Secondary | ICD-10-CM | POA: Diagnosis not present

## 2022-03-13 DIAGNOSIS — M5386 Other specified dorsopathies, lumbar region: Secondary | ICD-10-CM | POA: Diagnosis not present

## 2022-03-13 DIAGNOSIS — M9902 Segmental and somatic dysfunction of thoracic region: Secondary | ICD-10-CM | POA: Diagnosis not present

## 2022-03-13 DIAGNOSIS — M9903 Segmental and somatic dysfunction of lumbar region: Secondary | ICD-10-CM | POA: Diagnosis not present

## 2022-03-14 DIAGNOSIS — F4321 Adjustment disorder with depressed mood: Secondary | ICD-10-CM | POA: Diagnosis not present

## 2022-03-22 ENCOUNTER — Telehealth: Payer: Self-pay | Admitting: *Deleted

## 2022-03-22 NOTE — Telephone Encounter (Signed)
Patient contacted the office requesting to have her lab results reviewed. She requested if you want to call her she would be off on Monday and to call her then or we can send her a my chart message.

## 2022-03-23 NOTE — Progress Notes (Signed)
Terri Terri [name] [facility] Phone: [phone] Subjective:   Terri Terri [name], am serving as a scribe for Dr. Hulan Saas.  I'm seeing this patient by the request  of:  Panosh, Standley Brooking, MD  CC: back and neck pain follow up   [id number]  Terri Terri [name] is a 57 y.o. female coming in with complaint of back and neck pain. OMT on 01/18/2022. Patient states that she is the same as last visit.  Patient did see rheumatology.  Saw them and did have some elevation in the thyroid antibodies.  At the moment though all other thyroid test are unremarkable.  Medications patient has been prescribed: doxycycline Voltaren          Reviewed prior external information including notes and imaging from previsou exam, outside providers and external EMR if available.   As well as notes that were available from care everywhere and other healthcare systems.  Past medical history, social, surgical and family history all reviewed in electronic medical record.  Terri [name] pertanent information unless stated regarding to the chief complaint.   Past Medical History:  Diagnosis Date   Anxiety    Depression    Environmental allergies    GERD (gastroesophageal reflux disease)    Headache(784.0)    Recurrent sinusitis    ent and pulm eval in past.    Allergies  Allergen Reactions   Augmentin [Amoxicillin-Pot Clavulanate] Nausea Only   Celecoxib     REACTION: unspecified   Sulfa Antibiotics    Sulfonamide Derivatives     REACTION: hives, throat swelling   Monistat [Miconazole] Other (See Comments)    Sensitive to topical vaginal  Azoles Terri [name] side effects with oral Diflucan     Review of Systems:  Terri [name] headache, visual changes, nausea, vomiting, diarrhea, constipation, dizziness, abdominal pain, skin rash, fevers, chills, night sweats, weight loss, swollen lymph nodes, body aches, joint swelling, chest pain, shortness of breath, mood changes. POSITIVE  muscle aches  Objective  Blood pressure 112/70, height '5\' 3"'$  (1.6 m), weight 126 lb (57.2 kg), last menstrual period 07/19/2017.   General: Terri [name] apparent distress alert and oriented x3 mood and affect normal, dressed appropriately.  HEENT: Pupils equal, extraocular movements intact  Respiratory: Patient's speak in full sentences and does not appear short of breath  Cardiovascular: Terri [name] lower extremity edema, non tender, Terri [name] erythema  MSK:  Back hypermobility noted in other areas but still has some weakness noted with core strengthening.  Some tenderness to palpation in the paraspinal musculature.  Hypermobility of the lower extremities making her hip flexors.  Osteopathic findings  C6 flexed rotated and side bent left T3 extended rotated and side bent right inhaled rib T6 extended rotated and side bent left L2 flexed rotated and side bent right Sacrum right on right       Assessment and Plan:  SI (sacroiliac) joint dysfunction Still believe that this is causing some of the concern as well.  Still concerned that there is any autoimmune disease that could be contributing.  Does respond well to osteopathic manipulation otherwise.  Discussed with patient at great length for  31 minutes with patient to discuss different treatment options as well as medications and what we will consider is the plan going forward.  Patient is going to continue to try to be active.  Follow-up with me again in 2 to 3 months    Nonallopathic problems  Decision today to treat with OMT was based on  Physical Exam  After verbal consent patient was treated with HVLA, ME, FPR techniques in cervical, rib, thoracic, lumbar, and sacral  areas  Patient tolerated the procedure well with improvement in symptoms  Patient given exercises, stretches and lifestyle modifications  See medications in patient instructions if given  Patient will follow up in 8 weeks      The above documentation has been reviewed and is  accurate and complete Terri Pulley, DO        Note: This dictation was prepared with Dragon dictation along with smaller phrase technology. Any transcriptional errors that result from this process are unintentional.

## 2022-03-27 NOTE — Telephone Encounter (Signed)
Thyroid function panel is normal so I do not know whether the positive antibody markers are causing any current inflammation. The IgG4 level is normal. Immunoglobulin test shows a very mild elevation in IgA but I don't think it is large enough to be significant. We could refer to endocrinology about the abnormal results but I expect would just be the same as monitoring for now. I would not start her on any rheumatology drugs unless symptoms change or if we follow up and saw change in labs indicating more current inflammation.

## 2022-03-27 NOTE — Telephone Encounter (Signed)
Attempted to contact the patient and left message for patient to call the office.  

## 2022-03-27 NOTE — Telephone Encounter (Signed)
I called patient, patient verbalized understanding, labs faxed to [facility] and PCP Dr. Shanon Ace.

## 2022-03-28 ENCOUNTER — Ambulatory Visit: Payer: BC Managed Care – PPO | Admitting: Family Medicine

## 2022-03-28 VITALS — BP 112/70 | Ht 63.0 in | Wt 126.0 lb

## 2022-03-28 DIAGNOSIS — M533 Sacrococcygeal disorders, not elsewhere classified: Secondary | ICD-10-CM

## 2022-03-28 DIAGNOSIS — M9908 Segmental and somatic dysfunction of rib cage: Secondary | ICD-10-CM

## 2022-03-28 DIAGNOSIS — M9903 Segmental and somatic dysfunction of lumbar region: Secondary | ICD-10-CM | POA: Diagnosis not present

## 2022-03-28 DIAGNOSIS — M9904 Segmental and somatic dysfunction of sacral region: Secondary | ICD-10-CM

## 2022-03-28 DIAGNOSIS — M9902 Segmental and somatic dysfunction of thoracic region: Secondary | ICD-10-CM

## 2022-03-28 DIAGNOSIS — M9901 Segmental and somatic dysfunction of cervical region: Secondary | ICD-10-CM | POA: Diagnosis not present

## 2022-03-28 NOTE — Patient Instructions (Signed)
Good to see you We will watch thyroid every 6 months See me in 2-3 months

## 2022-03-28 NOTE — Assessment & Plan Note (Signed)
Still believe that this is causing some of the concern as well.  Still concerned that there is any autoimmune disease that could be contributing.  Does respond well to osteopathic manipulation otherwise.  Discussed with patient at great length for  31 minutes with patient to discuss different treatment options as well as medications and what we will consider is the plan going forward.  Patient is going to continue to try to be active.  Follow-up with me again in 2 to 3 months

## 2022-03-29 DIAGNOSIS — M9902 Segmental and somatic dysfunction of thoracic region: Secondary | ICD-10-CM | POA: Diagnosis not present

## 2022-03-29 DIAGNOSIS — M9901 Segmental and somatic dysfunction of cervical region: Secondary | ICD-10-CM | POA: Diagnosis not present

## 2022-03-29 DIAGNOSIS — M9905 Segmental and somatic dysfunction of pelvic region: Secondary | ICD-10-CM | POA: Diagnosis not present

## 2022-03-29 DIAGNOSIS — M65842 Other synovitis and tenosynovitis, left hand: Secondary | ICD-10-CM | POA: Diagnosis not present

## 2022-03-29 DIAGNOSIS — M9903 Segmental and somatic dysfunction of lumbar region: Secondary | ICD-10-CM | POA: Diagnosis not present

## 2022-03-29 DIAGNOSIS — M5386 Other specified dorsopathies, lumbar region: Secondary | ICD-10-CM | POA: Diagnosis not present

## 2022-03-31 DIAGNOSIS — F4321 Adjustment disorder with depressed mood: Secondary | ICD-10-CM | POA: Diagnosis not present

## 2022-04-07 ENCOUNTER — Telehealth: Payer: Self-pay | Admitting: Internal Medicine

## 2022-04-07 DIAGNOSIS — R3121 Asymptomatic microscopic hematuria: Secondary | ICD-10-CM | POA: Diagnosis not present

## 2022-04-07 NOTE — Telephone Encounter (Signed)
Prescription Request  04/07/2022  LOV: Visit date not found  What is the name of the medication or equipment? methylphenidate (CONCERTA) 36 MG PO CR tablet   Have you contacted your pharmacy to request a refill? No   Which pharmacy would you like this sent to?  [id number] - [place],  - [address] [place] [address] Phone: [phone] Fax: [phone]    Patient notified that their request is being sent to the clinical staff for review and that they should receive a response within 2 business days.   Please advise at Mobile [phone] (mobile)

## 2022-04-10 DIAGNOSIS — M5386 Other specified dorsopathies, lumbar region: Secondary | ICD-10-CM | POA: Diagnosis not present

## 2022-04-10 DIAGNOSIS — M65842 Other synovitis and tenosynovitis, left hand: Secondary | ICD-10-CM | POA: Diagnosis not present

## 2022-04-10 DIAGNOSIS — M9903 Segmental and somatic dysfunction of lumbar region: Secondary | ICD-10-CM | POA: Diagnosis not present

## 2022-04-10 DIAGNOSIS — M9902 Segmental and somatic dysfunction of thoracic region: Secondary | ICD-10-CM | POA: Diagnosis not present

## 2022-04-10 DIAGNOSIS — M9905 Segmental and somatic dysfunction of pelvic region: Secondary | ICD-10-CM | POA: Diagnosis not present

## 2022-04-18 DIAGNOSIS — F4321 Adjustment disorder with depressed mood: Secondary | ICD-10-CM | POA: Diagnosis not present

## 2022-04-24 ENCOUNTER — Other Ambulatory Visit: Payer: Self-pay | Admitting: Family

## 2022-04-24 MED ORDER — METHYLPHENIDATE HCL ER 36 MG PO TB24: ORAL_TABLET | ORAL | 0 refills | Status: DC

## 2022-04-24 NOTE — Telephone Encounter (Signed)
Last VV-11/10/21 Next OV-05/02/22

## 2022-04-24 NOTE — Telephone Encounter (Signed)
Pt is calling back for a refill on CONCERTA and stated she have been out for four days made pt a appt .

## 2022-04-25 DIAGNOSIS — D225 Melanocytic nevi of trunk: Secondary | ICD-10-CM | POA: Diagnosis not present

## 2022-04-25 DIAGNOSIS — L814 Other melanin hyperpigmentation: Secondary | ICD-10-CM | POA: Diagnosis not present

## 2022-04-25 DIAGNOSIS — L821 Other seborrheic keratosis: Secondary | ICD-10-CM | POA: Diagnosis not present

## 2022-04-27 DIAGNOSIS — M9905 Segmental and somatic dysfunction of pelvic region: Secondary | ICD-10-CM | POA: Diagnosis not present

## 2022-04-27 DIAGNOSIS — M9902 Segmental and somatic dysfunction of thoracic region: Secondary | ICD-10-CM | POA: Diagnosis not present

## 2022-04-27 DIAGNOSIS — M5386 Other specified dorsopathies, lumbar region: Secondary | ICD-10-CM | POA: Diagnosis not present

## 2022-04-27 DIAGNOSIS — M65842 Other synovitis and tenosynovitis, left hand: Secondary | ICD-10-CM | POA: Diagnosis not present

## 2022-04-27 DIAGNOSIS — M9903 Segmental and somatic dysfunction of lumbar region: Secondary | ICD-10-CM | POA: Diagnosis not present

## 2022-05-01 NOTE — Progress Notes (Unsigned)
Virtual Visit via Video Note  I connected with Terri [name] on 05/02/22 at  8:30 AM EDT by a video enabled telemedicine application and verified that I am speaking with the correct person using two identifiers. Location patient: home Location provider:work office Persons participating in the virtual visit: patient, provider   Patient aware  of the limitations of evaluation and management by telemedicine and  availability of in person appointments. and agreed to proceed.  HPI: Terri [name] presents for video visit for med evalution ADDADHD  medication working well and takes daily . Concerta seems fine  methylphenidate.  Does ok  no concerns   Recent Wellbutrin  change in manufacturer from pharmacy   and noted a difference  side effects ?  Right away Noted 2 weeks  can definably tell a difference .  Wake up with anxiety  and middle of night  and like bad dream;   exact same time 330 getting depression.  Sensitive cries easily .   Takes both in am  around 8 am   Now on hrt   and patch and pill  Hx of renal stones  Tfts nl but pos aby  Had similar  se when another med changed .  Is dealing with caretaking and family illness but had been doing ok until med changed .  Still sees counselor   ROS: See pertinent positives and negatives per HPI.  Past Medical History:  Diagnosis Date   Anxiety    Depression    Environmental allergies    GERD (gastroesophageal reflux disease)    Headache(784.0)    Recurrent sinusitis    ent and pulm eval in past.    Past Surgical History:  Procedure Laterality Date   HIP ARTHROSCOPY W/ LABRAL DEBRIDEMENT Left    At [facility]    Family History  Problem Relation Age of Onset   Diabetes Father    Heart attack Father    Leukemia Sister    Heart disease Other        fhx    Social History   Tobacco Use   Smoking status: Never    Passive exposure: Never   Smokeless tobacco: Never  Vaping Use   Vaping Use: Never used  Substance Use Topics    Alcohol use: Yes    Comment: Occasional use   Drug use: No      Current Outpatient Medications:    ALPRAZolam (XANAX) 0.25 MG tablet, Take 1 tablet (0.25 mg total) by mouth 3 (three) times daily as needed for anxiety (panic)., Disp: 20 tablet, Rfl: 0   buPROPion (WELLBUTRIN XL) 300 MG 24 hr tablet, TAKE 1 TABLET BY MOUTH EVERY DAY, Disp: 90 tablet, Rfl: 0   diclofenac (VOLTAREN) 75 MG EC tablet, Take 1 tablet (75 mg total) by mouth daily., Disp: 3 tablet, Rfl: 0   estradiol (CLIMARA - DOSED IN MG/24 HR) 0.025 mg/24hr patch, Place 0.025 mg onto the skin once a week., Disp: , Rfl:    fluticasone (FLONASE) 50 MCG/ACT nasal spray, SPRAY 2 SPRAYS INTO EACH NOSTRIL EVERY DAY, Disp: 16 g, Rfl: 11   Hydrocortisone Butyrate 0.1 % OINT, hydrocortisone butyrate 0.1 % topical ointment, Disp: , Rfl:    Ivermectin (SOOLANTRA) 1 % CREA, Soolantra 1 % topical cream, Disp: , Rfl:    L-FORMULA LYSINE HCL PO, daily., Disp: , Rfl:    meloxicam (MOBIC) 15 MG tablet, as needed., Disp: , Rfl:    methylphenidate 36 MG PO CR tablet, TAKE 1 TABLET BY  MOUTH DAILY. * FILL 01/07/22, Disp: 30 tablet, Rfl: 0   mometasone (ELOCON) 0.1 % cream, APPLY TO AFFECTED AREA EVERY DAY AS NEEDED FOR ITCH, Disp: , Rfl:    Nutritional Supplements (DHEA PO), Take by mouth., Disp: , Rfl:    pregabalin (LYRICA) 50 MG capsule, Take 50 mg by mouth 2 (two) times daily., Disp: , Rfl:    progesterone (PROMETRIUM) 100 MG capsule, Take 100 mg by mouth at bedtime., Disp: , Rfl:    progesterone (PROMETRIUM) 200 MG capsule, Take 200 mg by mouth daily., Disp: , Rfl:    Ruxolitinib Phosphate (OPZELURA) 1.5 % CREA, Apply topically daily., Disp: , Rfl:    spironolactone (ALDACTONE) 25 MG tablet, Take 25 mg by mouth daily., Disp: , Rfl:    SUMAtriptan (IMITREX) 100 MG tablet, Take 1 at onset of  Migraine headache   and can repeat in 2 hours ., Disp: 9 tablet, Rfl: 1   valACYclovir (VALTREX) 1000 MG tablet, Take 1 tablet (1,000 mg total) by mouth 2  (two) times daily. As direted, Disp: 180 tablet, Rfl: 1   Vitamin D, Ergocalciferol, (DRISDOL) 50000 units CAPS capsule, TAKE 1 CAPSULE EVERY 7 DAYS, Disp: 12 capsule, Rfl: 0   methylphenidate (CONCERTA) 36 MG PO CR tablet, Take 1 tablet (36 mg total) by mouth daily., Disp: 30 tablet, Rfl: 0  EXAM: BP Readings from Last 3 Encounters:  03/28/22 112/70  03/07/22 114/77  02/02/22 112/77    VITALS per patient if applicable:  GENERAL: alert, oriented, appears well and in no acute distress  a bit emotional when planning what to do with medication but cognition  nl and nl speech   HEENT: atraumatic, conjunttiva clear, no obvious abnormalities on inspection of external nose and ears  NECK: normal movements of the head and neck  LUNGS: on inspection no signs of respiratory distress, breathing rate appears normal, no obvious gross SOB, gasping or wheezing  CV: no obvious cyanosis  MS: moves all visible extremities without noticeable abnormality  PSYCH/NEURO: pleasant and cooperative, speech and thought processing grossly intact Lab Results  Component Value Date   WBC 6.7 08/18/2021   HGB 14.1 08/18/2021   HCT 42.6 08/18/2021   PLT 244.0 08/18/2021   GLUCOSE 83 08/18/2021   CHOL 219 (H) 03/08/2020   TRIG 66.0 03/08/2020   HDL 103.30 03/08/2020   LDLCALC 102 (H) 03/08/2020   ALT 17 08/18/2021   AST 22 08/18/2021   NA 137 08/18/2021   K 4.4 08/18/2021   CL 100 08/18/2021   CREATININE 0.87 08/18/2021   BUN 10 08/18/2021   CO2 29 08/18/2021   TSH 2.97 03/07/2022    ASSESSMENT AND PLAN:  Discussed the following assessment and plan:    ICD-10-CM   1. Attention deficit hyperactivity disorder (ADHD), unspecified ADHD type  F90.9     2. Medication side effect ?  T88.7XXA    very coincincident when  generic med changed  of wellbutrin xl  consider goin off in interim ask pharmacy to send info on generic previous so we can rx again    3. Medication management  Z79.899     4.  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  Ok to continue on Concerta  Benefit more than risk at this time  to continue.  Counseled.  32 minutes review plan counsel about fu plan of med change  Expectant management and discussion of plan and treatment with opportunity to ask questions and all were answered. The patient agreed with the plan and demonstrated an understanding of the instructions.   Advised to call back or seek an in-person evaluation if worsening  or having  further concerns  in interim. No follow-ups on file.    Berniece Andreas, MD

## 2022-05-02 ENCOUNTER — Telehealth (INDEPENDENT_AMBULATORY_CARE_PROVIDER_SITE_OTHER): Payer: BC Managed Care – PPO | Admitting: Internal Medicine

## 2022-05-02 ENCOUNTER — Encounter: Payer: Self-pay | Admitting: Internal Medicine

## 2022-05-02 DIAGNOSIS — E063 Autoimmune thyroiditis: Secondary | ICD-10-CM

## 2022-05-02 DIAGNOSIS — Z79899 Other long term (current) drug therapy: Secondary | ICD-10-CM | POA: Diagnosis not present

## 2022-05-02 DIAGNOSIS — J328 Other chronic sinusitis: Secondary | ICD-10-CM

## 2022-05-02 DIAGNOSIS — T887XXA Unspecified adverse effect of drug or medicament, initial encounter: Secondary | ICD-10-CM | POA: Diagnosis not present

## 2022-05-02 DIAGNOSIS — F909 Attention-deficit hyperactivity disorder, unspecified type: Secondary | ICD-10-CM | POA: Diagnosis not present

## 2022-05-02 DIAGNOSIS — F4321 Adjustment disorder with depressed mood: Secondary | ICD-10-CM | POA: Diagnosis not present

## 2022-05-07 ENCOUNTER — Other Ambulatory Visit: Payer: Self-pay | Admitting: Family

## 2022-05-16 DIAGNOSIS — M9903 Segmental and somatic dysfunction of lumbar region: Secondary | ICD-10-CM | POA: Diagnosis not present

## 2022-05-16 DIAGNOSIS — M5386 Other specified dorsopathies, lumbar region: Secondary | ICD-10-CM | POA: Diagnosis not present

## 2022-05-16 DIAGNOSIS — M65842 Other synovitis and tenosynovitis, left hand: Secondary | ICD-10-CM | POA: Diagnosis not present

## 2022-05-16 DIAGNOSIS — M9905 Segmental and somatic dysfunction of pelvic region: Secondary | ICD-10-CM | POA: Diagnosis not present

## 2022-05-16 DIAGNOSIS — M9902 Segmental and somatic dysfunction of thoracic region: Secondary | ICD-10-CM | POA: Diagnosis not present

## 2022-05-19 DIAGNOSIS — F4321 Adjustment disorder with depressed mood: Secondary | ICD-10-CM | POA: Diagnosis not present

## 2022-05-22 ENCOUNTER — Other Ambulatory Visit: Payer: Self-pay | Admitting: Internal Medicine

## 2022-05-22 NOTE — Telephone Encounter (Signed)
Prescription Request  05/22/2022  LVV:  05/02/22 = Med check/refills  What is the name of the medication or equipment?  methylphenidate 36 MG PO CR tablet  Pt aware MD is OOO on Mondays. Pt states she only has 3 pills left.  Have you contacted your pharmacy to request a refill? Yes   Which pharmacy would you like this sent to?  [id number] [place], [place] - [address] AT CORNER OF [facility] [address] [place] [address] Phone: [phone] Fax: [phone]    Patient notified that their request is being sent to the clinical staff for review and that they should receive a response within 2 business days.   Please advise at Mobile [phone] (mobile)

## 2022-05-23 MED ORDER — METHYLPHENIDATE HCL ER 36 MG PO TB24: ORAL_TABLET | ORAL | 0 refills | Status: DC

## 2022-05-29 ENCOUNTER — Encounter: Payer: Self-pay | Admitting: Internal Medicine

## 2022-05-29 MED ORDER — METHYLPHENIDATE HCL ER 36 MG PO TB24: ORAL_TABLET | ORAL | 0 refills | Status: DC

## 2022-05-29 NOTE — Telephone Encounter (Signed)
Error/njr °

## 2022-05-29 NOTE — Telephone Encounter (Signed)
Patient found a pharmacy that has methylphenidate 36 MG PO CR tablet. Says she is going out of town Wednesday and is asking that it is filled prior if possible.    [facility] [address] [place] [place] - [address] Phone: [phone]  Fax: [phone]

## 2022-05-30 NOTE — Telephone Encounter (Signed)
Contacted pt. Left message that the prescription was sent to requested pharmacy. To give [place] a call if have any question.

## 2022-05-30 NOTE — Progress Notes (Unsigned)
[name] [address] [place] [address] Phone: [phone] Subjective:   [name], am serving as a scribe for Dr. Antoine Primas.  I'm seeing this patient by the request  of:  Panosh, [name], MD  CC: Low back and neck pain follow-up  [id number]  Terri [name] is a 57 y.o. female coming in with complaint of back and neck pain. OMT on 03/28/2022. Patient states that epidural seems to be wearing off on L side. Pain in lumbar spine is radiating into the glute and also has tension in L quad/hip flexor.   Also having wrist pain over dorsal aspect of wrist. Pain with pronation and supination. Notices taking dishes out of the dishwasher increases her pain. Has tried ART on wrist.       Reviewed prior external information including notes and imaging from previsou exam, outside providers and external EMR if available.   As well as notes that were available from care everywhere and other healthcare systems.  Past medical history, social, surgical and family history all reviewed in electronic medical record.  No pertanent information unless stated regarding to the chief complaint.   Past Medical History:  Diagnosis Date   Anxiety    Depression    Environmental allergies    GERD (gastroesophageal reflux disease)    Headache(784.0)    Recurrent sinusitis    ent and pulm eval in past.    Allergies  Allergen Reactions   Augmentin [Amoxicillin-Pot Clavulanate] Nausea Only   Celecoxib     REACTION: unspecified   Sulfa Antibiotics    Sulfonamide Derivatives     REACTION: hives, throat swelling   Monistat [Miconazole] Other (See Comments)    Sensitive to topical vaginal  Azoles No side effects with oral Diflucan     Review of Systems:  No headache, visual changes, nausea, vomiting, diarrhea, constipation, dizziness, abdominal pain, skin rash, fevers, chills, night sweats, weight loss, swollen lymph nodes, body aches, joint swelling,  chest pain, shortness of breath, mood changes. POSITIVE muscle aches  Objective  Blood pressure (!) 122/92, pulse 97, height 5\' 3"  (1.6 m), weight 127 lb (57.6 kg), SpO2 99 %.   General: No apparent distress alert and oriented x3 mood and affect normal, dressed appropriately.  HEENT: Pupils equal, extraocular movements intact  Respiratory: Patient's speak in full sentences and does not appear short of breath  Cardiovascular: No lower extremity edema, non tender, no erythema  Hypermobility still noted.  Does have tenderness to palpation in the paraspinal musculature of the lumbar spine.  Significant tightness of the hip flexor on the left side.  Some mild limitation in internal range of motion. Left wrist does have some pain with pronation against resistance.  Tender to palpation on the dorsal aspect of the wrist.   Limited muscular skeletal ultrasound was performed and interpreted by Antoine Primas, M  Ultrasound of the third compartment of the dorsal aspect does have hypoechoic changes noted.  No true tearing appreciated.  No underlying arthritic changes noted.  Osteopathic findings  C2 flexed rotated and side bent right C7 flexed rotated and side bent left T3 extended rotated and side bent right inhaled rib T11 extended rotated and side bent left L2 flexed rotated and side bent right Sacrum right on right       Assessment and Plan:  Left wrist tendinitis Left wrist tendinitis.  Will try taping first.  Discussed icing and other potential activities.  Patient does have  Nonallopathic problems  Decision today to treat with OMT was based on Physical Exam  After verbal consent patient was treated with HVLA, ME, FPR techniques in cervical, rib, thoracic, lumbar, and sacral  areas  Patient tolerated the procedure well with improvement in symptoms  Patient given exercises, stretches and lifestyle modifications  See medications in patient instructions if given  Patient will  follow up in 4-8 weeks    The above documentation has been reviewed and is accurate and complete Judi Saa, DO          Note: This dictation was prepared with Dragon dictation along with smaller phrase technology. Any transcriptional errors that result from this process are unintentional.

## 2022-05-31 ENCOUNTER — Other Ambulatory Visit: Payer: Self-pay

## 2022-05-31 ENCOUNTER — Encounter: Payer: Self-pay | Admitting: Family Medicine

## 2022-05-31 ENCOUNTER — Ambulatory Visit: Payer: BC Managed Care – PPO | Admitting: Family Medicine

## 2022-05-31 VITALS — BP 122/92 | HR 97 | Ht 63.0 in | Wt 127.0 lb

## 2022-05-31 DIAGNOSIS — M778 Other enthesopathies, not elsewhere classified: Secondary | ICD-10-CM

## 2022-05-31 DIAGNOSIS — M9903 Segmental and somatic dysfunction of lumbar region: Secondary | ICD-10-CM | POA: Diagnosis not present

## 2022-05-31 DIAGNOSIS — M9908 Segmental and somatic dysfunction of rib cage: Secondary | ICD-10-CM | POA: Diagnosis not present

## 2022-05-31 DIAGNOSIS — M533 Sacrococcygeal disorders, not elsewhere classified: Secondary | ICD-10-CM | POA: Diagnosis not present

## 2022-05-31 DIAGNOSIS — M25532 Pain in left wrist: Secondary | ICD-10-CM | POA: Diagnosis not present

## 2022-05-31 DIAGNOSIS — M9904 Segmental and somatic dysfunction of sacral region: Secondary | ICD-10-CM

## 2022-05-31 DIAGNOSIS — M5030 Other cervical disc degeneration, unspecified cervical region: Secondary | ICD-10-CM | POA: Diagnosis not present

## 2022-05-31 DIAGNOSIS — M9901 Segmental and somatic dysfunction of cervical region: Secondary | ICD-10-CM

## 2022-05-31 DIAGNOSIS — M9902 Segmental and somatic dysfunction of thoracic region: Secondary | ICD-10-CM

## 2022-05-31 NOTE — Assessment & Plan Note (Signed)
Patient does have significant abductor weakness.  Tightness of the hip flexor.  Discussed again at great length.  Responds well to manipulation.

## 2022-05-31 NOTE — Assessment & Plan Note (Addendum)
Left wrist tendinitis.  Will try taping first.  Discussed icing and other potential activities.  Patient does have injection if it worsens.

## 2022-05-31 NOTE — Assessment & Plan Note (Signed)
Discussed degenerative disc disease and icing regimen.  Discussed which activities to do and which ones to avoid.  Increase activity slowly.  Follow-up again in 6 to 8 weeks

## 2022-05-31 NOTE — Patient Instructions (Signed)
[place]an for wrist See me again in 7-8 weeks

## 2022-06-08 DIAGNOSIS — F4321 Adjustment disorder with depressed mood: Secondary | ICD-10-CM | POA: Diagnosis not present

## 2022-06-13 DIAGNOSIS — M5386 Other specified dorsopathies, lumbar region: Secondary | ICD-10-CM | POA: Diagnosis not present

## 2022-06-13 DIAGNOSIS — M9903 Segmental and somatic dysfunction of lumbar region: Secondary | ICD-10-CM | POA: Diagnosis not present

## 2022-06-13 DIAGNOSIS — M9902 Segmental and somatic dysfunction of thoracic region: Secondary | ICD-10-CM | POA: Diagnosis not present

## 2022-06-13 DIAGNOSIS — M9905 Segmental and somatic dysfunction of pelvic region: Secondary | ICD-10-CM | POA: Diagnosis not present

## 2022-06-13 DIAGNOSIS — M65842 Other synovitis and tenosynovitis, left hand: Secondary | ICD-10-CM | POA: Diagnosis not present

## 2022-06-16 ENCOUNTER — Telehealth: Payer: Self-pay | Admitting: Internal Medicine

## 2022-06-16 NOTE — Telephone Encounter (Signed)
Error/njr °

## 2022-06-22 DIAGNOSIS — H53143 Visual discomfort, bilateral: Secondary | ICD-10-CM | POA: Diagnosis not present

## 2022-06-27 ENCOUNTER — Telehealth: Payer: Self-pay | Admitting: Internal Medicine

## 2022-06-27 NOTE — Telephone Encounter (Signed)
Prescription Request  06/27/2022  LOV: Visit date not found  What is the name of the medication or equipment? buPROPion (WELLBUTRIN XL) 300 MG 24 hr tablet  manufactured by [employer] and refill for methylphenidate 36 MG PO CR tablet  Have you contacted your pharmacy to request a refill? Yes   Which pharmacy would you like this sent to?  [id number] - , [place] - [address] AT [address] OF [facility] [address] [place] [address] Phone: [phone] Fax: [phone]   Patient notified that their request is being sent to the clinical staff for review and that they should receive a response within 2 business days.   Please advise at Mobile [phone] (mobile)

## 2022-06-28 MED ORDER — BUPROPION HCL ER (XL) 300 MG PO TB24: 300.0000 mg | ORAL_TABLET | Freq: Every day | ORAL | 0 refills | Status: DC

## 2022-06-28 MED ORDER — METHYLPHENIDATE HCL ER 36 MG PO TB24: ORAL_TABLET | ORAL | 0 refills | Status: DC

## 2022-06-28 NOTE — Telephone Encounter (Signed)
Last video visit: 05/02/2022  Wellbutrin sent.  Please advise on methylphenidate 36MG .

## 2022-06-28 NOTE — Telephone Encounter (Signed)
Sent in electronically .  

## 2022-06-28 NOTE — Addendum Note (Signed)
Addended by: Vickii Chafe on: 06/28/2022 08:31 AM   Modules accepted: Orders

## 2022-06-28 NOTE — Addendum Note (Signed)
Addended byMadelin Headings on: 06/28/2022 05:41 PM   Modules accepted: Orders

## 2022-06-28 NOTE — Telephone Encounter (Addendum)
Pt called to ask if the Rx for her methylphenidate 36 MG PO CR tablet can be sent to:  [id number] [place], [place] - [address] [phone] [address] [place] [address]   because her pharmacy of choice does not have it at the moment, but the [facility] does.  Please advise.

## 2022-07-13 NOTE — Progress Notes (Signed)
[name] [address] [place] [address] Phone: [phone] Subjective:   Terri [name], am serving as a scribe for Dr. Antoine Primas.  I'm seeing this patient by the request  of:  Panosh, Terri Mends, MD  CC: Foot pain, back pain  [id number]  Terri [name] is a 57 y.o. female coming in with complaint of back and neck pain. OMT on 05/31/2022. Patient states that three weeks ago she woke up with swelling and bruising over the midfoot of L foot. 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 One week ago she woke up with a swollen 2nd toe on L foot. Painful to walk without shoes. Also feels a pulling in the achilles but feels this may be due to antalgic gait. Neuroma pain comes and goes.   Medications patient has been prescribed:   Taking:         Reviewed prior external information including notes and imaging from previsou exam, outside providers and external EMR if available.   As well as notes that were available from care everywhere and other healthcare systems.  Past medical history, social, surgical and family history all reviewed in electronic medical record.  No pertanent information unless stated regarding to the chief complaint.   Past Medical History:  Diagnosis Date   Anxiety    Depression    Environmental allergies    GERD (gastroesophageal reflux disease)    Headache(784.0)    Recurrent sinusitis    ent and pulm eval in past.    Allergies  Allergen Reactions   Augmentin [Amoxicillin-Pot Clavulanate] Nausea Only   Celecoxib     REACTION: unspecified   Sulfa Antibiotics    Sulfonamide Derivatives     REACTION: hives, throat swelling   Monistat [Miconazole] Other (See Comments)    Sensitive to topical vaginal  Azoles No side effects with oral Diflucan     Review of Systems:  No headache, visual changes, nausea, vomiting, diarrhea, constipation, dizziness, abdominal pain, skin rash, fevers, chills, night sweats, weight loss, swollen  lymph nodes, body aches, joint swelling, chest pain, shortness of breath, mood changes. POSITIVE muscle aches  Objective  Blood pressure 106/76, pulse 68, height 5\' 3"  (1.6 m), weight 125 lb (56.7 kg), SpO2 98 %.   General: No apparent distress alert and oriented x3 mood and affect normal, dressed appropriately.  HEENT: Pupils equal, extraocular movements intact  Respiratory: Patient's speak in full sentences and does not appear short of breath  Cardiovascular: No lower extremity edema, non tender, no erythema  Gait relatively normal.  Patient's right foot has some resolving bruising noted over the dorsal aspect of the foot. MSK:  Back does have loss of lordosis noted.  Some hypermobility of the some joints such as the hips bilaterally.  Severely tender to palpation though in the paraspinal musculature of the lumbar spine.  Osteopathic findings  C2 flexed rotated and side bent right C6 flexed rotated and side bent left T3 extended rotated and side bent right inhaled rib T9 extended rotated and side bent left L2 flexed rotated and side bent right Sacrum right on right  Limited muscular skeletal ultrasound was performed and interpreted by Antoine Primas, M   Limited ultrasound of the foot does not show any significant bony abnormality noted today.     Assessment and Plan:  Lumbar radicular pain Still multifactorial.  Will continue to monitor.  Patient did have increasing in tightness of the lower back.  No significant weakness.  We  discussed with patient about icing regimen and home exercises, we discussed which activities to do and which ones to avoid.  Discussed posture and ergonomics otherwise.  Discussed with patient about the has seen the chiropractor.  Has responded well to osteopathic manipulation here.  No change in medications but does have Zanaflex for breakthrough pain.  Follow-up again in 6 to 8 weeks  Bursitis of foot Has had difficulty of bursitis.  Questionable rupture.   I do think it is more likely though that this is a small capillary that did burst and cause some bruising initially.  Do not see any cortical irregularity noted.  Patient is ambulating relatively well.  Discussed proper shoes and making sure there is not too much pressure.  Follow-up again in 6 to 8 weeks    Nonallopathic problems  Decision today to treat with OMT was based on Physical Exam  After verbal consent patient was treated with HVLA, ME, FPR techniques in cervical, rib, thoracic, lumbar, and sacral  areas  Patient tolerated the procedure well with improvement in symptoms  Patient given exercises, stretches and lifestyle modifications  See medications in patient instructions if given  Patient will follow up in 4-8 weeks      The above documentation has been reviewed and is accurate and complete Judi Saa, DO        Note: This dictation was prepared with Dragon dictation along with smaller phrase technology. Any transcriptional errors that result from this process are unintentional.

## 2022-07-18 ENCOUNTER — Telehealth: Payer: Self-pay | Admitting: Internal Medicine

## 2022-07-18 MED ORDER — FLUTICASONE PROPIONATE 50 MCG/ACT NA SUSP: NASAL | 11 refills | Status: AC

## 2022-07-18 NOTE — Telephone Encounter (Signed)
Prescription Request  07/18/2022  LOV: Visit date not found  What is the name of the medication or equipment?  fluticasone fluticasone (FLONASE) 50 MCG/ACT nasal spray  Pt is asking for MD to send refill &/or autho for this refill.  Have you contacted your pharmacy to request a refill? Yes   Which pharmacy would you like this sent to?   [id number] [place], [place] - [address] AT CORNER OF [facility] [address] [place] [address] Phone: [phone] Fax: [phone]    Patient notified that their request is being sent to the clinical staff for review and that they should receive a response within 2 business days.   Please advise at Mobile [phone] (mobile)

## 2022-07-18 NOTE — Telephone Encounter (Signed)
Rx sent with 11 refills

## 2022-07-19 ENCOUNTER — Ambulatory Visit: Payer: BC Managed Care – PPO | Admitting: Family Medicine

## 2022-07-19 ENCOUNTER — Encounter: Payer: Self-pay | Admitting: Family Medicine

## 2022-07-19 ENCOUNTER — Other Ambulatory Visit: Payer: Self-pay

## 2022-07-19 VITALS — BP 106/76 | HR 68 | Ht 63.0 in | Wt 125.0 lb

## 2022-07-19 DIAGNOSIS — M9902 Segmental and somatic dysfunction of thoracic region: Secondary | ICD-10-CM

## 2022-07-19 DIAGNOSIS — M9901 Segmental and somatic dysfunction of cervical region: Secondary | ICD-10-CM

## 2022-07-19 DIAGNOSIS — M9904 Segmental and somatic dysfunction of sacral region: Secondary | ICD-10-CM

## 2022-07-19 DIAGNOSIS — M7752 Other enthesopathy of left foot: Secondary | ICD-10-CM

## 2022-07-19 DIAGNOSIS — M79672 Pain in left foot: Secondary | ICD-10-CM | POA: Diagnosis not present

## 2022-07-19 DIAGNOSIS — M5416 Radiculopathy, lumbar region: Secondary | ICD-10-CM

## 2022-07-19 DIAGNOSIS — M9903 Segmental and somatic dysfunction of lumbar region: Secondary | ICD-10-CM | POA: Diagnosis not present

## 2022-07-19 DIAGNOSIS — M9908 Segmental and somatic dysfunction of rib cage: Secondary | ICD-10-CM

## 2022-07-19 NOTE — Patient Instructions (Signed)
Burst capillaries Altra shoes See you again in 2 months

## 2022-07-19 NOTE — Assessment & Plan Note (Signed)
Still multifactorial.  Will continue to monitor.  Patient did have increasing in tightness of the lower back.  No significant weakness.  We discussed with patient about icing regimen and home exercises, we discussed which activities to do and which ones to avoid.  Discussed posture and ergonomics otherwise.  Discussed with patient about the has seen the chiropractor.  Has responded well to osteopathic manipulation here.  No change in medications but does have Zanaflex for breakthrough pain.  Follow-up again in 6 to 8 weeks

## 2022-07-19 NOTE — Assessment & Plan Note (Signed)
Has had difficulty of bursitis.  Questionable rupture.  I do think it is more likely though that this is a small capillary that did burst and cause some bruising initially.  Do not see any cortical irregularity noted.  Patient is ambulating relatively well.  Discussed proper shoes and making sure there is not too much pressure.  Follow-up again in 6 to 8 weeks

## 2022-08-01 ENCOUNTER — Encounter: Payer: Self-pay | Admitting: Family Medicine

## 2022-08-07 ENCOUNTER — Telehealth: Payer: Self-pay | Admitting: Internal Medicine

## 2022-08-07 ENCOUNTER — Other Ambulatory Visit: Payer: Self-pay | Admitting: Family

## 2022-08-07 DIAGNOSIS — M9902 Segmental and somatic dysfunction of thoracic region: Secondary | ICD-10-CM | POA: Diagnosis not present

## 2022-08-07 DIAGNOSIS — M9905 Segmental and somatic dysfunction of pelvic region: Secondary | ICD-10-CM | POA: Diagnosis not present

## 2022-08-07 DIAGNOSIS — M9903 Segmental and somatic dysfunction of lumbar region: Secondary | ICD-10-CM | POA: Diagnosis not present

## 2022-08-07 DIAGNOSIS — M5386 Other specified dorsopathies, lumbar region: Secondary | ICD-10-CM | POA: Diagnosis not present

## 2022-08-07 MED ORDER — METHYLPHENIDATE HCL ER (OSM) 36 MG PO TBCR: 36.0000 mg | EXTENDED_RELEASE_TABLET | Freq: Every day | ORAL | 0 refills | Status: DC

## 2022-08-07 NOTE — Telephone Encounter (Signed)
Prescription Request  08/07/2022  LOV: Visit date not found  What is the name of the medication or equipment? methylphenidate 36 MG PO CR tablet   Have you contacted your pharmacy to request a refill? No   Which pharmacy would you like this sent to?  [id number] - [place], [place] - [address] AT [address]NER OF [facility] [address] [place] [address] Phone: [phone] Fax: [phone]     Patient notified that their request is being sent to the clinical staff for review and that they should receive a response within 2 business days.   Please advise at Mobile [phone] (mobile)

## 2022-08-08 ENCOUNTER — Ambulatory Visit: Payer: BC Managed Care – PPO | Admitting: Internal Medicine

## 2022-08-15 DIAGNOSIS — K219 Gastro-esophageal reflux disease without esophagitis: Secondary | ICD-10-CM | POA: Diagnosis not present

## 2022-08-15 DIAGNOSIS — K449 Diaphragmatic hernia without obstruction or gangrene: Secondary | ICD-10-CM | POA: Diagnosis not present

## 2022-08-15 DIAGNOSIS — Z1211 Encounter for screening for malignant neoplasm of colon: Secondary | ICD-10-CM | POA: Diagnosis not present

## 2022-08-15 DIAGNOSIS — F32A Depression, unspecified: Secondary | ICD-10-CM | POA: Diagnosis not present

## 2022-08-16 DIAGNOSIS — F4321 Adjustment disorder with depressed mood: Secondary | ICD-10-CM | POA: Diagnosis not present

## 2022-09-04 ENCOUNTER — Encounter: Payer: Self-pay | Admitting: Internal Medicine

## 2022-09-04 ENCOUNTER — Ambulatory Visit: Payer: BC Managed Care – PPO | Admitting: Internal Medicine

## 2022-09-04 VITALS — BP 130/80 | HR 78 | Temp 98.5°F | Ht 63.0 in | Wt 122.3 lb

## 2022-09-04 DIAGNOSIS — F900 Attention-deficit hyperactivity disorder, predominantly inattentive type: Secondary | ICD-10-CM | POA: Diagnosis not present

## 2022-09-04 DIAGNOSIS — M9902 Segmental and somatic dysfunction of thoracic region: Secondary | ICD-10-CM | POA: Diagnosis not present

## 2022-09-04 DIAGNOSIS — E063 Autoimmune thyroiditis: Secondary | ICD-10-CM | POA: Diagnosis not present

## 2022-09-04 DIAGNOSIS — E782 Mixed hyperlipidemia: Secondary | ICD-10-CM | POA: Diagnosis not present

## 2022-09-04 DIAGNOSIS — R519 Headache, unspecified: Secondary | ICD-10-CM

## 2022-09-04 DIAGNOSIS — M357 Hypermobility syndrome: Secondary | ICD-10-CM

## 2022-09-04 DIAGNOSIS — G8929 Other chronic pain: Secondary | ICD-10-CM

## 2022-09-04 DIAGNOSIS — M65842 Other synovitis and tenosynovitis, left hand: Secondary | ICD-10-CM | POA: Diagnosis not present

## 2022-09-04 DIAGNOSIS — M9903 Segmental and somatic dysfunction of lumbar region: Secondary | ICD-10-CM | POA: Diagnosis not present

## 2022-09-04 DIAGNOSIS — M9905 Segmental and somatic dysfunction of pelvic region: Secondary | ICD-10-CM | POA: Diagnosis not present

## 2022-09-04 DIAGNOSIS — M5386 Other specified dorsopathies, lumbar region: Secondary | ICD-10-CM | POA: Diagnosis not present

## 2022-09-04 DIAGNOSIS — F4323 Adjustment disorder with mixed anxiety and depressed mood: Secondary | ICD-10-CM

## 2022-09-04 MED ORDER — BUPROPION HCL ER (XL) 300 MG PO TB24: 300.0000 mg | ORAL_TABLET | Freq: Every day | ORAL | 3 refills | Status: DC

## 2022-09-04 MED ORDER — METHYLPHENIDATE HCL ER (OSM) 36 MG PO TBCR: 36.0000 mg | EXTENDED_RELEASE_TABLET | Freq: Every day | ORAL | 0 refills | Status: DC

## 2022-09-04 NOTE — Patient Instructions (Addendum)
Gene sight is the DNA test you were talking about and the psychiatrist can order this.  We have sent in the concerta.

## 2022-09-04 NOTE — Assessment & Plan Note (Signed)
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

## 2022-09-04 NOTE — Progress Notes (Unsigned)
   Subjective:   Patient ID: Terri [name], female    DOB: 1965-10-24, 57 y.o.   MRN: [id number]  HPI The patient is a 57 YO female coming in for transfer of care  PMH, [facility], social history reviewed and updated  Review of Systems  Objective:  Physical Exam  There were no vitals filed for this visit.  Assessment & Plan:

## 2022-09-06 NOTE — Assessment & Plan Note (Signed)
Recent lipid panel at goal. Continue yearly monitoring.

## 2022-09-06 NOTE — Assessment & Plan Note (Signed)
Has been on concerta 36 mg daily and refilled today. [place] database reviewed and appropriate.

## 2022-09-06 NOTE — Assessment & Plan Note (Signed)
With a lot of issues that she manages with exercise and otc.

## 2022-09-06 NOTE — Assessment & Plan Note (Signed)
Uses sumatriptan 100 mg at onset of headache.

## 2022-09-06 NOTE — Assessment & Plan Note (Signed)
Wellbutrin 300 mg daily is doing well. Refilled today.

## 2022-09-13 ENCOUNTER — Encounter: Payer: Self-pay | Admitting: Family Medicine

## 2022-09-13 NOTE — Progress Notes (Unsigned)
[name] [address] [place] [address] Phone: [phone] Subjective:   Terri [name], am serving as a scribe for Dr. Antoine Primas.  I'm seeing this patient by the request  of:  Myrlene Broker, MD  CC: Back and neck pain follow-up  [id number]  Kentara Naro is a 57 y.o. female coming in with complaint of back and neck pain. OMT on 07/19/2022. Patient states that neck pain is the same as last visit.   Fell in knee one month ago. Pain over healed laceration. States that laceration was deep but did not go in to get stitches. Squatting and going down stairs increase her pain. Hx of fx patella a few years ago.   Medications patient has been prescribed:   Taking:         Reviewed prior external information including notes and imaging from previsou exam, outside providers and external EMR if available.   As well as notes that were available from care everywhere and other healthcare systems.  Past medical history, social, surgical and family history all reviewed in electronic medical record.  No pertanent information unless stated regarding to the chief complaint.   Past Medical History:  Diagnosis Date   Anxiety    Depression    Environmental allergies    GERD (gastroesophageal reflux disease)    Headache(784.0)    Recurrent sinusitis    ent and pulm eval in past.    Allergies  Allergen Reactions   Augmentin [Amoxicillin-Pot Clavulanate] Nausea Only   Celecoxib     REACTION: unspecified   Sulfa Antibiotics    Sulfonamide Derivatives     REACTION: hives, throat swelling   Monistat [Miconazole] Other (See Comments)    Sensitive to topical vaginal  Azoles No side effects with oral Diflucan     Review of Systems:  No headache, visual changes, nausea, vomiting, diarrhea, constipation, dizziness, abdominal pain, skin rash, fevers, chills, night sweats, weight loss, swollen lymph nodes, body aches, joint swelling, chest  pain, shortness of breath, mood changes. POSITIVE muscle aches  Objective  Blood pressure 124/82, pulse 94, height 5\' 3"  (1.6 m), last menstrual period 07/19/2017, SpO2 96%.   General: No apparent distress alert and oriented x3 mood and affect normal, dressed appropriately.  HEENT: Pupils equal, extraocular movements intact  Respiratory: Patient's speak in full sentences and does not appear short of breath  Cardiovascular: No lower extremity edema, non tender, no erythema  Back exam does have some loss lordosis.  Patient does have a slight bruit noted on the right side of the back.  Patient does have some tenderness to palpation noted as well. Hypermobility still noted as well. Left knee still severely tender to palpation over the anterior aspect of the kneecap.  Mild crepitus noted.  No abnormal movement noted.   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  Will be traveling.  Discussed proper lifting mechanics.  Called and noted to have fianc can carry the bags.  Increase activity slowly otherwise.  Discussed icing regimen and home exercises.  Follow-up again in 6 to 8 weeks  Left anterior  knee pain Fell onto the anterior aspect of the left knee.  Does have some mild swelling noted.  Does have a resolving scar.  Will get a x-ray to make sure there is no bony abnormality especially with patient leaving and may need to have brace.  Discussed with patient about icing regimen and home exercises.  Increase activity slowly.  Follow-up again in 6 to 8 weeks.    Nonallopathic problems  Decision today to treat with OMT was based on Physical  Exam  After verbal consent patient was treated with HVLA, ME, FPR techniques in cervical, rib, thoracic, lumbar, and sacral  areas  Patient tolerated the procedure well with improvement in symptoms  Patient given exercises, stretches and lifestyle modifications  See medications in patient instructions if given  Patient will follow up in 4-8 weeks     The above documentation has been reviewed and is accurate and complete Judi Saa, DO         Note: This dictation was prepared with Dragon dictation along with smaller phrase technology. Any transcriptional errors that result from this process are unintentional.

## 2022-09-14 ENCOUNTER — Encounter: Payer: Self-pay | Admitting: Family Medicine

## 2022-09-14 ENCOUNTER — Ambulatory Visit (INDEPENDENT_AMBULATORY_CARE_PROVIDER_SITE_OTHER): Payer: BC Managed Care – PPO

## 2022-09-14 ENCOUNTER — Ambulatory Visit: Payer: BC Managed Care – PPO | Admitting: Family Medicine

## 2022-09-14 VITALS — BP 124/82 | HR 94 | Ht 63.0 in

## 2022-09-14 DIAGNOSIS — M9901 Segmental and somatic dysfunction of cervical region: Secondary | ICD-10-CM

## 2022-09-14 DIAGNOSIS — M9902 Segmental and somatic dysfunction of thoracic region: Secondary | ICD-10-CM | POA: Diagnosis not present

## 2022-09-14 DIAGNOSIS — M25562 Pain in left knee: Secondary | ICD-10-CM

## 2022-09-14 DIAGNOSIS — M9903 Segmental and somatic dysfunction of lumbar region: Secondary | ICD-10-CM

## 2022-09-14 DIAGNOSIS — M940 Chondrocostal junction syndrome [Tietze]: Secondary | ICD-10-CM | POA: Diagnosis not present

## 2022-09-14 DIAGNOSIS — M9904 Segmental and somatic dysfunction of sacral region: Secondary | ICD-10-CM

## 2022-09-14 DIAGNOSIS — M9908 Segmental and somatic dysfunction of rib cage: Secondary | ICD-10-CM

## 2022-09-14 DIAGNOSIS — H04121 Dry eye syndrome of right lacrimal gland: Secondary | ICD-10-CM | POA: Diagnosis not present

## 2022-09-14 NOTE — Patient Instructions (Signed)
Good to see you Have a great trip Xray L knee See me again in 2 months

## 2022-09-14 NOTE — Assessment & Plan Note (Signed)
Continues to have some discomfort overall.  Will be traveling.  Discussed proper lifting mechanics.  Called and noted to have fianc can carry the bags.  Increase activity slowly otherwise.  Discussed icing regimen and home exercises.  Follow-up again in 6 to 8 weeks

## 2022-09-14 NOTE — Assessment & Plan Note (Signed)
Fell onto the anterior aspect of the left knee.  Does have some mild swelling noted.  Does have a resolving scar.  Will get a x-ray to make sure there is no bony abnormality especially with patient leaving and may need to have brace.  Discussed with patient about icing regimen and home exercises.  Increase activity slowly.  Follow-up again in 6 to 8 weeks.

## 2022-10-02 DIAGNOSIS — M5386 Other specified dorsopathies, lumbar region: Secondary | ICD-10-CM | POA: Diagnosis not present

## 2022-10-02 DIAGNOSIS — M9903 Segmental and somatic dysfunction of lumbar region: Secondary | ICD-10-CM | POA: Diagnosis not present

## 2022-10-02 DIAGNOSIS — M9905 Segmental and somatic dysfunction of pelvic region: Secondary | ICD-10-CM | POA: Diagnosis not present

## 2022-10-02 DIAGNOSIS — M9902 Segmental and somatic dysfunction of thoracic region: Secondary | ICD-10-CM | POA: Diagnosis not present

## 2022-10-02 DIAGNOSIS — M65842 Other synovitis and tenosynovitis, left hand: Secondary | ICD-10-CM | POA: Diagnosis not present

## 2022-10-06 ENCOUNTER — Other Ambulatory Visit: Payer: Self-pay

## 2022-10-06 ENCOUNTER — Encounter: Payer: Self-pay | Admitting: Internal Medicine

## 2022-10-06 MED ORDER — BUPROPION HCL ER (XL) 300 MG PO TB24: 300.0000 mg | ORAL_TABLET | Freq: Every day | ORAL | 3 refills | Status: DC

## 2022-10-06 NOTE — Telephone Encounter (Signed)
I have sent in refill for Wellbutrin but Methylphenidate  is expired.

## 2022-10-09 MED ORDER — METHYLPHENIDATE HCL ER (OSM) 36 MG PO TBCR: 36.0000 mg | EXTENDED_RELEASE_TABLET | Freq: Every day | ORAL | 0 refills | Status: DC

## 2022-10-23 DIAGNOSIS — K5730 Diverticulosis of large intestine without perforation or abscess without bleeding: Secondary | ICD-10-CM | POA: Diagnosis not present

## 2022-10-23 DIAGNOSIS — D125 Benign neoplasm of sigmoid colon: Secondary | ICD-10-CM | POA: Diagnosis not present

## 2022-10-23 DIAGNOSIS — Z1211 Encounter for screening for malignant neoplasm of colon: Secondary | ICD-10-CM | POA: Diagnosis not present

## 2022-10-23 DIAGNOSIS — K635 Polyp of colon: Secondary | ICD-10-CM | POA: Diagnosis not present

## 2022-10-24 DIAGNOSIS — M9905 Segmental and somatic dysfunction of pelvic region: Secondary | ICD-10-CM | POA: Diagnosis not present

## 2022-10-24 DIAGNOSIS — M9902 Segmental and somatic dysfunction of thoracic region: Secondary | ICD-10-CM | POA: Diagnosis not present

## 2022-10-24 DIAGNOSIS — M9903 Segmental and somatic dysfunction of lumbar region: Secondary | ICD-10-CM | POA: Diagnosis not present

## 2022-10-24 DIAGNOSIS — M5386 Other specified dorsopathies, lumbar region: Secondary | ICD-10-CM | POA: Diagnosis not present

## 2022-11-07 DIAGNOSIS — M9905 Segmental and somatic dysfunction of pelvic region: Secondary | ICD-10-CM | POA: Diagnosis not present

## 2022-11-07 DIAGNOSIS — M9903 Segmental and somatic dysfunction of lumbar region: Secondary | ICD-10-CM | POA: Diagnosis not present

## 2022-11-07 DIAGNOSIS — M5386 Other specified dorsopathies, lumbar region: Secondary | ICD-10-CM | POA: Diagnosis not present

## 2022-11-07 DIAGNOSIS — M9902 Segmental and somatic dysfunction of thoracic region: Secondary | ICD-10-CM | POA: Diagnosis not present

## 2022-11-08 ENCOUNTER — Encounter: Payer: Self-pay | Admitting: Internal Medicine

## 2022-11-09 ENCOUNTER — Other Ambulatory Visit: Payer: Self-pay

## 2022-11-09 MED ORDER — METHYLPHENIDATE HCL ER (OSM) 36 MG PO TBCR: 36.0000 mg | EXTENDED_RELEASE_TABLET | Freq: Every day | ORAL | 0 refills | Status: DC

## 2022-11-10 NOTE — Progress Notes (Unsigned)
[name] [address] [place] [address] Phone: [phone] Subjective:   INadine Counts, am serving as a scribe for Dr. Antoine Primas.  I'm seeing this patient by the request  of:  Myrlene Broker, MD  CC: back and neck pain   [id number]  Terri [name] is a 57 y.o. female coming in with complaint of back and neck pain. OMT on 09/14/2022. Patient states injection from several months ago at L4/L5 thinks its wearing off. Feels like knife was going into SI joint on R side. Does she need to get another one.  Medications patient has been prescribed:   Taking:         Reviewed prior external information including notes and imaging from previsou exam, outside providers and external EMR if available.   As well as notes that were available from care everywhere and other healthcare systems.  Past medical history, social, surgical and family history all reviewed in electronic medical record.  No pertanent information unless stated regarding to the chief complaint.   Past Medical History:  Diagnosis Date   Anxiety    Depression    Environmental allergies    GERD (gastroesophageal reflux disease)    Headache(784.0)    Recurrent sinusitis    ent and pulm eval in past.    Allergies  Allergen Reactions   Augmentin [Amoxicillin-Pot Clavulanate] Nausea Only   Celecoxib     REACTION: unspecified   Sulfa Antibiotics    Sulfonamide Derivatives     REACTION: hives, throat swelling   Monistat [Miconazole] Other (See Comments)    Sensitive to topical vaginal  Azoles No side effects with oral Diflucan     Review of Systems:  No headache, visual changes, nausea, vomiting, diarrhea, constipation, dizziness, abdominal pain, skin rash, fevers, chills, night sweats, weight loss, swollen lymph nodes, body aches, joint swelling, chest pain, shortness of breath, mood changes. POSITIVE muscle aches  Objective  Last menstrual period  07/19/2017.   General: No apparent distress alert and oriented x3 mood and affect normal, dressed appropriately.  HEENT: Pupils equal, extraocular movements intact  Respiratory: Patient's speak in full sentences and does not appear short of breath  Cardiovascular: No lower extremity edema, non tender, no erythema  Hypermobility noted.  Tightness noted with FABER.  Does have negative straight leg test noted.  Worsening pain with extension of the back.  Pain in the L4-L5 area  Osteopathic findings  C2 flexed rotated and side bent right C6 flexed rotated and side bent left T3 extended rotated and side bent right inhaled rib T9 extended rotated and side bent left L2 flexed rotated and side bent right L5 flexed rotated and side bent left Sacrum right on right       Assessment and Plan:  No problem-specific Assessment & Plan notes found for this encounter.    Nonallopathic problems  Decision today to treat with OMT was based on Physical Exam  After verbal consent patient was treated with HVLA, ME, FPR techniques in cervical, rib, thoracic, lumbar, and sacral  areas  Patient tolerated the procedure well with improvement in symptoms  Patient given exercises, stretches and lifestyle modifications  See medications in patient instructions if given  Patient will follow up in 4-8 weeks     The above documentation has been reviewed and is accurate and complete Judi Saa, DO         Note: This dictation was prepared with Dragon dictation along with smaller  Lobbyist. Any transcriptional errors that result from this process are unintentional.

## 2022-11-13 ENCOUNTER — Ambulatory Visit (INDEPENDENT_AMBULATORY_CARE_PROVIDER_SITE_OTHER): Payer: BC Managed Care – PPO | Admitting: Family Medicine

## 2022-11-13 VITALS — BP 102/66 | HR 87 | Ht 63.0 in | Wt 127.0 lb

## 2022-11-13 DIAGNOSIS — M9904 Segmental and somatic dysfunction of sacral region: Secondary | ICD-10-CM

## 2022-11-13 DIAGNOSIS — M9902 Segmental and somatic dysfunction of thoracic region: Secondary | ICD-10-CM

## 2022-11-13 DIAGNOSIS — M9901 Segmental and somatic dysfunction of cervical region: Secondary | ICD-10-CM

## 2022-11-13 DIAGNOSIS — M9903 Segmental and somatic dysfunction of lumbar region: Secondary | ICD-10-CM

## 2022-11-13 DIAGNOSIS — M461 Sacroiliitis, not elsewhere classified: Secondary | ICD-10-CM

## 2022-11-13 DIAGNOSIS — M9908 Segmental and somatic dysfunction of rib cage: Secondary | ICD-10-CM | POA: Diagnosis not present

## 2022-11-13 NOTE — Patient Instructions (Signed)
Good to see you! See you again in 6-8 weeks Don't worry about the knee those people are mean

## 2022-11-14 ENCOUNTER — Encounter: Payer: Self-pay | Admitting: Family Medicine

## 2022-11-14 NOTE — Assessment & Plan Note (Signed)
Known arthritic changes noted.  Discussed icing regimen and home exercises.  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  Discussed which activities to do and which ones to avoid.  No change otherwise at the moment.  Follow-up again in 6 to 8 weeks.  Did discuss with patient having worsening L4-L5 pain with intermittent radicular symptoms to consider another epidural which patient has not had for 2 years.

## 2022-11-20 DIAGNOSIS — M5386 Other specified dorsopathies, lumbar region: Secondary | ICD-10-CM | POA: Diagnosis not present

## 2022-11-20 DIAGNOSIS — M9903 Segmental and somatic dysfunction of lumbar region: Secondary | ICD-10-CM | POA: Diagnosis not present

## 2022-11-20 DIAGNOSIS — M9902 Segmental and somatic dysfunction of thoracic region: Secondary | ICD-10-CM | POA: Diagnosis not present

## 2022-11-20 DIAGNOSIS — M9905 Segmental and somatic dysfunction of pelvic region: Secondary | ICD-10-CM | POA: Diagnosis not present

## 2022-11-27 DIAGNOSIS — M5386 Other specified dorsopathies, lumbar region: Secondary | ICD-10-CM | POA: Diagnosis not present

## 2022-11-27 DIAGNOSIS — M9905 Segmental and somatic dysfunction of pelvic region: Secondary | ICD-10-CM | POA: Diagnosis not present

## 2022-11-27 DIAGNOSIS — M9902 Segmental and somatic dysfunction of thoracic region: Secondary | ICD-10-CM | POA: Diagnosis not present

## 2022-11-27 DIAGNOSIS — M9903 Segmental and somatic dysfunction of lumbar region: Secondary | ICD-10-CM | POA: Diagnosis not present

## 2022-12-05 DIAGNOSIS — Z1231 Encounter for screening mammogram for malignant neoplasm of breast: Secondary | ICD-10-CM | POA: Diagnosis not present

## 2022-12-05 LAB — HM MAMMOGRAPHY

## 2022-12-11 DIAGNOSIS — M9905 Segmental and somatic dysfunction of pelvic region: Secondary | ICD-10-CM | POA: Diagnosis not present

## 2022-12-11 DIAGNOSIS — M9902 Segmental and somatic dysfunction of thoracic region: Secondary | ICD-10-CM | POA: Diagnosis not present

## 2022-12-11 DIAGNOSIS — M5386 Other specified dorsopathies, lumbar region: Secondary | ICD-10-CM | POA: Diagnosis not present

## 2022-12-11 DIAGNOSIS — M9903 Segmental and somatic dysfunction of lumbar region: Secondary | ICD-10-CM | POA: Diagnosis not present

## 2022-12-13 ENCOUNTER — Ambulatory Visit: Payer: BC Managed Care – PPO | Admitting: Internal Medicine

## 2022-12-17 ENCOUNTER — Encounter: Payer: Self-pay | Admitting: Internal Medicine

## 2022-12-18 MED ORDER — METHYLPHENIDATE HCL ER (OSM) 36 MG PO TBCR: 36.0000 mg | EXTENDED_RELEASE_TABLET | Freq: Every day | ORAL | 0 refills | Status: DC

## 2022-12-18 NOTE — Telephone Encounter (Signed)
Please refill for patient.

## 2022-12-20 DIAGNOSIS — B029 Zoster without complications: Secondary | ICD-10-CM | POA: Diagnosis not present

## 2022-12-20 NOTE — Progress Notes (Signed)
[name] [address] [place] [address] Phone: [phone] Subjective:   [name], am serving as a scribe for Dr. Antoine Primas.  I'm seeing this patient by the request  of:  Myrlene Broker, MD  CC: SI joint follow-up  [id number]  Terri [name] is a 57 y.o. female coming in with complaint of back and neck pain. OMT on 11/13/2022. Patient states same per usual. No new concerns. R side neck arm, but recently diagnosed with shingles.  Patient has had more of a rash recently as well.  States that it is very itchy.  Seems to be more on the anterior chest and not of the vesicles she was having previously.  Patient is very stressed with a lot of life changes coming up.         Reviewed prior external information including notes and imaging from previsou exam, outside providers and external EMR if available.   As well as notes that were available from care everywhere and other healthcare systems.  Past medical history, social, surgical and family history all reviewed in electronic medical record.  No pertanent information unless stated regarding to the chief complaint.   Past Medical History:  Diagnosis Date   Anxiety    Depression    Environmental allergies    GERD (gastroesophageal reflux disease)    Headache(784.0)    Recurrent sinusitis    ent and pulm eval in past.    Allergies  Allergen Reactions   Augmentin [Amoxicillin-Pot Clavulanate] Nausea Only   Celecoxib     REACTION: unspecified   Sulfa Antibiotics    Sulfonamide Derivatives     REACTION: hives, throat swelling   Monistat [Miconazole] Other (See Comments)    Sensitive to topical vaginal  Azoles No side effects with oral Diflucan     Review of Systems:  No headache, visual changes, nausea, vomiting, diarrhea, constipation, dizziness, abdominal pain, skin rash, fevers, chills, night sweats, weight loss, swollen lymph nodes, body aches, joint swelling,  chest pain, shortness of breath, mood changes. POSITIVE muscle aches  Objective  Blood pressure 122/70, pulse 72, height 5\' 3"  (1.6 m), weight 124 lb (56.2 kg), last menstrual period 07/19/2017, SpO2 96%.   General: No apparent distress alert and oriented x3 mood and affect normal, dressed appropriately.  HEENT: Pupils equal, extraocular movements intact  Respiratory: Patient's speak in full sentences and does not appear short of breath  Cardiovascular: No lower extremity edema, non tender, no erythema  Gait moderately antalgic Skin does show the patient does have what appears to be some redness on the front of her chest.  Patient's right shoulder blade has 3 resolving vesicles. MSK:  Back continued hypermobility noted. Patient does have significant tenderness in the lower back especially around the sacroiliac joint.  Osteopathic findings  C2 flexed rotated and side bent right C6 flexed rotated and side bent left T3 extended rotated and side bent right inhaled rib T9 extended rotated and side bent left L2 flexed rotated and side bent right L5 flexed rotated and side bent left Sacrum right on right       Assessment and Plan:  SI joint arthritis (HCC) Known arthritic changes.  Discussed icing regimen and home exercises, discussed which activities to Terri [name] and which ones to avoid.  Increase activity slowly.  Will continue to monitor.  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  Will continue to work on Print production planner.  Follow-up again in 6  to 8 weeks  Degenerative disc disease, cervical Chronic but stable.  Will continue to monitor.  Patient does have some limited sidebending to the left today.  Allergy Patient is having some type of allergy.  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  Given topical steroid though with patient recently  having shingles so we did not get a another outbreak if it can help it.  Follow-up again in 6 to 8 weeks    Nonallopathic problems  Decision today to treat with OMT was based on Physical Exam  After verbal consent patient was treated with HVLA, ME, FPR techniques in cervical, rib, thoracic, lumbar, and sacral  areas  Patient tolerated the procedure well with improvement in symptoms  Patient given exercises, stretches and lifestyle modifications  See medications in patient instructions if given  Patient will follow up in 4-8 weeks     The above documentation has been reviewed and is accurate and complete Judi Saa, [name]         Note: This dictation was prepared with Dragon dictation along with smaller phrase technology. Any transcriptional errors that result from this process are unintentional.

## 2022-12-26 ENCOUNTER — Encounter: Payer: Self-pay | Admitting: Internal Medicine

## 2022-12-26 ENCOUNTER — Ambulatory Visit (INDEPENDENT_AMBULATORY_CARE_PROVIDER_SITE_OTHER): Payer: BC Managed Care – PPO | Admitting: Internal Medicine

## 2022-12-26 VITALS — BP 124/80 | HR 79 | Temp 98.5°F | Ht 63.0 in | Wt 208.0 lb

## 2022-12-26 DIAGNOSIS — B0229 Other postherpetic nervous system involvement: Secondary | ICD-10-CM | POA: Diagnosis not present

## 2022-12-26 MED ORDER — FLUCONAZOLE 150 MG PO TABS: 150.0000 mg | ORAL_TABLET | Freq: Every day | ORAL | 0 refills | Status: AC

## 2022-12-26 MED ORDER — GABAPENTIN 100 MG PO CAPS: 100.0000 mg | ORAL_CAPSULE | Freq: Two times a day (BID) | ORAL | 1 refills | Status: AC

## 2022-12-26 NOTE — Patient Instructions (Addendum)
Start taking B12 1000 mcg daily over the counter to help.  We have sent in the gabapentin 100 mg to take up to 2 times a day.  We have sent in the diflucan to take for yeast infection.

## 2022-12-26 NOTE — Progress Notes (Unsigned)
   Subjective:   Patient ID: Terri [name], female    DOB: 29-Sep-1965, 57 y.o.   MRN: [id number]  HPI The patient is a 57 YO female coming in for new shingles diagnosis and on valtrex 1 gm TID. Is improving slightly but pain still present. On back and right side and right chest wall.   Review of Systems  Constitutional: Negative.   HENT: Negative.    Eyes: Negative.   Respiratory:  Negative for cough, chest tightness and shortness of breath.   Cardiovascular:  Negative for chest pain, palpitations and leg swelling.  Gastrointestinal:  Negative for abdominal distention, abdominal pain, constipation, diarrhea, nausea and vomiting.  Musculoskeletal:  Positive for arthralgias and myalgias.  Skin:  Positive for rash.  Neurological: Negative.   Psychiatric/Behavioral: Negative.      Objective:  Physical Exam Constitutional:      Appearance: She is well-developed.  HENT:     Head: Normocephalic and atraumatic.  Cardiovascular:     Rate and Rhythm: Normal rate and regular rhythm.  Pulmonary:     Effort: Pulmonary effort is normal. No respiratory distress.     Breath sounds: Normal breath sounds. No wheezing or rales.  Abdominal:     General: Bowel sounds are normal. There is no distension.     Palpations: Abdomen is soft.     Tenderness: There is no abdominal tenderness. There is no rebound.  Musculoskeletal:        General: Tenderness present.     Cervical back: Normal range of motion.  Skin:    General: Skin is warm and dry.     Findings: Rash present.     Comments: Rash consistent with shingles right flank and right chest wall. 2-3 lesions midline neck which are healing which are also associated  Neurological:     Mental Status: She is alert and oriented to person, place, and time.     Coordination: Coordination normal.     Vitals:   12/26/22 1401  BP: 124/80  Pulse: 79  Temp: 98.5 F (36.9 C)  TempSrc: Oral  SpO2: 98%  Weight: 208 lb (94.3 kg)  Height: 5\' 3"  (1.6  m)    Assessment & Plan:

## 2022-12-27 ENCOUNTER — Ambulatory Visit: Payer: BC Managed Care – PPO | Admitting: Family Medicine

## 2022-12-27 ENCOUNTER — Encounter: Payer: Self-pay | Admitting: Family Medicine

## 2022-12-27 VITALS — BP 122/70 | HR 72 | Ht 63.0 in | Wt 124.0 lb

## 2022-12-27 DIAGNOSIS — M9904 Segmental and somatic dysfunction of sacral region: Secondary | ICD-10-CM | POA: Diagnosis not present

## 2022-12-27 DIAGNOSIS — M9901 Segmental and somatic dysfunction of cervical region: Secondary | ICD-10-CM

## 2022-12-27 DIAGNOSIS — T7840XA Allergy, unspecified, initial encounter: Secondary | ICD-10-CM

## 2022-12-27 DIAGNOSIS — M461 Sacroiliitis, not elsewhere classified: Secondary | ICD-10-CM

## 2022-12-27 DIAGNOSIS — M5030 Other cervical disc degeneration, unspecified cervical region: Secondary | ICD-10-CM

## 2022-12-27 DIAGNOSIS — M9902 Segmental and somatic dysfunction of thoracic region: Secondary | ICD-10-CM | POA: Diagnosis not present

## 2022-12-27 DIAGNOSIS — M9908 Segmental and somatic dysfunction of rib cage: Secondary | ICD-10-CM

## 2022-12-27 DIAGNOSIS — M9903 Segmental and somatic dysfunction of lumbar region: Secondary | ICD-10-CM

## 2022-12-27 MED ORDER — TRIAMCINOLONE ACETONIDE 0.5 % EX CREA: 1.0000 | TOPICAL_CREAM | Freq: Two times a day (BID) | CUTANEOUS | 3 refills | Status: AC

## 2022-12-27 NOTE — Assessment & Plan Note (Addendum)
Patient is having some type of allergy.  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  Given topical steroid though with patient recently having shingles so we did not get a another outbreak if it can help it.  Follow-up again in 6 to 8 weeks triamcinolone 0.5% prescribed to put on daily until rash disappears

## 2022-12-27 NOTE — Patient Instructions (Signed)
Good to see you! Triamcinolone cream daily for week if needed See you again in 6-8 weeks

## 2022-12-27 NOTE — Assessment & Plan Note (Signed)
Chronic but stable.  Will continue to monitor.  Patient does have some limited sidebending to the left today.

## 2022-12-27 NOTE — Assessment & Plan Note (Signed)
Known arthritic changes.  Discussed icing regimen and home exercises, discussed which activities to do and which ones to avoid.  Increase activity slowly.  Will continue to monitor.  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  Will continue to work on Print production planner.  Follow-up again in 6 to 8 weeks

## 2022-12-28 ENCOUNTER — Encounter: Payer: Self-pay | Admitting: Internal Medicine

## 2022-12-28 DIAGNOSIS — B029 Zoster without complications: Secondary | ICD-10-CM | POA: Insufficient documentation

## 2022-12-28 NOTE — Assessment & Plan Note (Signed)
Having pain and diffuse tingling (even in non-shingles area). Rx gabapentin 100 mg BID prn for pain. Advised to start taking B12 1000 mcg daily to help speed recovery. She will finish the valtrex 1 gm TID 1 week course. Counseled about need for shingles vaccine 1 year from clearance. Rx diflucan as she has yeast infection starting from antiviral treatment.

## 2023-01-03 ENCOUNTER — Encounter: Payer: Self-pay | Admitting: Internal Medicine

## 2023-01-27 ENCOUNTER — Encounter: Payer: Self-pay | Admitting: Internal Medicine

## 2023-01-29 ENCOUNTER — Other Ambulatory Visit: Payer: Self-pay

## 2023-01-29 MED ORDER — METHYLPHENIDATE HCL ER (OSM) 36 MG PO TBCR: 36.0000 mg | EXTENDED_RELEASE_TABLET | Freq: Every day | ORAL | 0 refills | Status: DC

## 2023-02-14 NOTE — Progress Notes (Unsigned)
[name] [address] [place] [address] Phone: [phone] Subjective:   [name], am serving as a scribe for Dr. Antoine Primas.  I'm seeing this patient by the request  of:  Myrlene Broker, MD  CC: Rib pain, back pain  [id number]  Terri [name] is a 58 y.o. female coming in with complaint of back and neck pain. OMT 12/27/2022. Patient states same per usual. Was a little sick during December and is still horse and wheezing and a little short of breathe. Doesn't know what she had.  Medications patient has been prescribed: Kenalog cream  Taking:         Reviewed prior external information including notes and imaging from previsou exam, outside providers and external EMR if available.   As well as notes that were available from care everywhere and other healthcare systems.  Past medical history, social, surgical and family history all reviewed in electronic medical record.  No pertanent information unless stated regarding to the chief complaint.   Past Medical History:  Diagnosis Date   Anxiety    Depression    Environmental allergies    GERD (gastroesophageal reflux disease)    Headache(784.0)    Recurrent sinusitis    ent and pulm eval in past.    Allergies  Allergen Reactions   Augmentin [Amoxicillin-Pot Clavulanate] Nausea Only   Celecoxib     REACTION: unspecified   Sulfa Antibiotics    Sulfonamide Derivatives     REACTION: hives, throat swelling   Monistat [Miconazole] Other (See Comments)    Sensitive to topical vaginal  Azoles No side effects with oral Diflucan     Review of Systems:  No headache, visual changes, nausea, vomiting, diarrhea, constipation, dizziness, abdominal pain, skin rash, fevers, chills, night sweats, weight loss, swollen lymph nodes, body aches, joint swelling, chest pain, shortness of breath, mood changes. POSITIVE muscle aches  Objective  Blood pressure 104/66, pulse 70,  height 5\' 3"  (1.6 m), weight 124 lb (56.2 kg), last menstrual period 07/19/2017, SpO2 98%.   General: No apparent distress alert and oriented x3 mood and affect normal, dressed appropriately.  HEENT: Pupils equal, extraocular movements intact  Respiratory: Patient's speak in full sentences and does not appear short of breath  Cardiovascular: No lower extremity edema, non tender, no erythema  Gait MSK:  Back significant pain noted in the back.  Tenderness to palpation diffusely.  Does have most pain in the rib cage area right greater than left.  Osteopathic findings  C2 flexed rotated and side bent right C5 flexed rotated and side bent left T3 extended rotated and side bent right inhaled rib T6 extended rotated and side bent left with inhaled rib T9 extended rotated and side bent left inhaled rib L3 flexed rotated and side bent right Sacrum right on right    Assessment and Plan:  SI joint arthritis (HCC) Known arthritic changes, this as well as old worsening of the slip rib secondary to patient's recent  Slipped rib syndrome Worsening due to URI Discussed HEP  Discussed posture and ergonomics.  Increase activity slowly otherwise.  Follow-up again in 6 to 8 weeks we will get x-rays to make sure patient did not break a rib with all the coughing she was doing as well as make sure there is not an underlying pneumonia.    Nonallopathic problems  Decision today to treat with OMT was based on Physical Exam  After verbal consent patient was treated with  HVLA, ME, FPR techniques in cervical, rib, thoracic, lumbar, and sacral  areas  Patient tolerated the procedure well with improvement in symptoms  Patient given exercises, stretches and lifestyle modifications  See medications in patient instructions if given  Patient will follow up in 4-8 weeks    The above documentation has been reviewed and is accurate and complete Judi Saa, DO          Note: This dictation was  prepared with Dragon dictation along with smaller phrase technology. Any transcriptional errors that result from this process are unintentional.

## 2023-02-15 ENCOUNTER — Ambulatory Visit: Payer: BC Managed Care – PPO

## 2023-02-15 ENCOUNTER — Encounter: Payer: Self-pay | Admitting: Family Medicine

## 2023-02-15 ENCOUNTER — Ambulatory Visit: Payer: BC Managed Care – PPO | Admitting: Family Medicine

## 2023-02-15 VITALS — BP 104/66 | HR 70 | Ht 63.0 in | Wt 124.0 lb

## 2023-02-15 DIAGNOSIS — M9902 Segmental and somatic dysfunction of thoracic region: Secondary | ICD-10-CM | POA: Diagnosis not present

## 2023-02-15 DIAGNOSIS — M9904 Segmental and somatic dysfunction of sacral region: Secondary | ICD-10-CM | POA: Diagnosis not present

## 2023-02-15 DIAGNOSIS — R0789 Other chest pain: Secondary | ICD-10-CM | POA: Diagnosis not present

## 2023-02-15 DIAGNOSIS — M940 Chondrocostal junction syndrome [Tietze]: Secondary | ICD-10-CM

## 2023-02-15 DIAGNOSIS — M9901 Segmental and somatic dysfunction of cervical region: Secondary | ICD-10-CM

## 2023-02-15 DIAGNOSIS — M9903 Segmental and somatic dysfunction of lumbar region: Secondary | ICD-10-CM

## 2023-02-15 DIAGNOSIS — M461 Sacroiliitis, not elsewhere classified: Secondary | ICD-10-CM | POA: Diagnosis not present

## 2023-02-15 DIAGNOSIS — M9908 Segmental and somatic dysfunction of rib cage: Secondary | ICD-10-CM | POA: Diagnosis not present

## 2023-02-15 DIAGNOSIS — R0602 Shortness of breath: Secondary | ICD-10-CM | POA: Diagnosis not present

## 2023-02-15 DIAGNOSIS — R062 Wheezing: Secondary | ICD-10-CM | POA: Diagnosis not present

## 2023-02-15 NOTE — Patient Instructions (Addendum)
Xray today Hope you feel better very soon See you again in 6-8 weeks

## 2023-02-15 NOTE — Assessment & Plan Note (Signed)
Worsening due to URI Discussed HEP  Discussed posture and ergonomics.  Increase activity slowly otherwise.  Follow-up again in 6 to 8 weeks we will get x-rays to make sure patient did not break a rib with all the coughing she was doing as well as make sure there is not an underlying pneumonia.

## 2023-02-15 NOTE — Assessment & Plan Note (Signed)
Known arthritic changes, this as well as old worsening of the slip rib secondary to patient's recent

## 2023-02-19 ENCOUNTER — Encounter: Payer: Self-pay | Admitting: Family Medicine

## 2023-02-22 DIAGNOSIS — Z6822 Body mass index (BMI) 22.0-22.9, adult: Secondary | ICD-10-CM | POA: Diagnosis not present

## 2023-02-22 DIAGNOSIS — Z01419 Encounter for gynecological examination (general) (routine) without abnormal findings: Secondary | ICD-10-CM | POA: Diagnosis not present

## 2023-02-22 DIAGNOSIS — N760 Acute vaginitis: Secondary | ICD-10-CM | POA: Diagnosis not present

## 2023-02-28 DIAGNOSIS — F4321 Adjustment disorder with depressed mood: Secondary | ICD-10-CM | POA: Diagnosis not present

## 2023-03-07 ENCOUNTER — Encounter: Payer: Self-pay | Admitting: Internal Medicine

## 2023-03-07 ENCOUNTER — Other Ambulatory Visit: Payer: Self-pay

## 2023-03-08 MED ORDER — METHYLPHENIDATE HCL ER (OSM) 36 MG PO TBCR: 36.0000 mg | EXTENDED_RELEASE_TABLET | Freq: Every day | ORAL | 0 refills | Status: DC

## 2023-03-19 DIAGNOSIS — F4321 Adjustment disorder with depressed mood: Secondary | ICD-10-CM | POA: Diagnosis not present

## 2023-03-20 NOTE — Progress Notes (Deleted)
  [name] [address] [place] [address] Phone: [phone] Subjective:    I'm seeing this patient by the request  of:  Myrlene Broker, MD  CC: neck and back pain follow up   [id number]  [name] is a 58 y.o. female coming in with complaint of back and neck pain. OMT on 02/15/2023. Patient states   Medications patient has been prescribed:   Taking:         Reviewed prior external information including notes and imaging from previsou exam, outside providers and external EMR if available.   As well as notes that were available from care everywhere and other healthcare systems.  Past medical history, social, surgical and family history all reviewed in electronic medical record.  No pertanent information unless stated regarding to the chief complaint.   Past Medical History:  Diagnosis Date   Anxiety    Depression    Environmental allergies    GERD (gastroesophageal reflux disease)    Headache(784.0)    Recurrent sinusitis    ent and pulm eval in past.    Allergies  Allergen Reactions   Augmentin [Amoxicillin-Pot Clavulanate] Nausea Only   Celecoxib     REACTION: unspecified   Sulfa Antibiotics    Sulfonamide Derivatives     REACTION: hives, throat swelling   Monistat [Miconazole] Other (See Comments)    Sensitive to topical vaginal  Azoles No side effects with oral Diflucan     Review of Systems:  No headache, visual changes, nausea, vomiting, diarrhea, constipation, dizziness, abdominal pain, skin rash, fevers, chills, night sweats, weight loss, swollen lymph nodes, body aches, joint swelling, chest pain, shortness of breath, mood changes. POSITIVE muscle aches  Objective  Last menstrual period 07/19/2017.   General: No apparent distress alert and oriented x3 mood and affect normal, dressed appropriately.  HEENT: Pupils equal, extraocular movements intact  Respiratory: Patient's speak in full sentences  and does not appear short of breath  Cardiovascular: No lower extremity edema, non tender, no erythema  Gait MSK:  Back   Osteopathic findings  C2 flexed rotated and side bent right C6 flexed rotated and side bent left T3 extended rotated and side bent right inhaled rib T9 extended rotated and side bent left L2 flexed rotated and side bent right Sacrum right on right       Assessment and Plan:  No problem-specific Assessment & Plan notes found for this encounter.    Nonallopathic problems  Decision today to treat with OMT was based on Physical Exam  After verbal consent patient was treated with HVLA, ME, FPR techniques in cervical, rib, thoracic, lumbar, and sacral  areas  Patient tolerated the procedure well with improvement in symptoms  Patient given exercises, stretches and lifestyle modifications  See medications in patient instructions if given  Patient will follow up in 4-8 weeks    The above documentation has been reviewed and is accurate and complete Judi Saa, DO          Note: This dictation was prepared with Dragon dictation along with smaller phrase technology. Any transcriptional errors that result from this process are unintentional.

## 2023-03-22 ENCOUNTER — Ambulatory Visit: Payer: BC Managed Care – PPO | Admitting: Family Medicine

## 2023-04-09 DIAGNOSIS — F4321 Adjustment disorder with depressed mood: Secondary | ICD-10-CM | POA: Diagnosis not present

## 2023-04-11 DIAGNOSIS — M9905 Segmental and somatic dysfunction of pelvic region: Secondary | ICD-10-CM | POA: Diagnosis not present

## 2023-04-11 DIAGNOSIS — M9902 Segmental and somatic dysfunction of thoracic region: Secondary | ICD-10-CM | POA: Diagnosis not present

## 2023-04-11 DIAGNOSIS — M9901 Segmental and somatic dysfunction of cervical region: Secondary | ICD-10-CM | POA: Diagnosis not present

## 2023-04-11 DIAGNOSIS — M5386 Other specified dorsopathies, lumbar region: Secondary | ICD-10-CM | POA: Diagnosis not present

## 2023-04-11 DIAGNOSIS — M9903 Segmental and somatic dysfunction of lumbar region: Secondary | ICD-10-CM | POA: Diagnosis not present

## 2023-04-13 ENCOUNTER — Encounter: Payer: Self-pay | Admitting: Internal Medicine

## 2023-04-16 ENCOUNTER — Telehealth: Payer: Self-pay | Admitting: Internal Medicine

## 2023-04-16 DIAGNOSIS — M531 Cervicobrachial syndrome: Secondary | ICD-10-CM | POA: Diagnosis not present

## 2023-04-16 DIAGNOSIS — M9902 Segmental and somatic dysfunction of thoracic region: Secondary | ICD-10-CM | POA: Diagnosis not present

## 2023-04-16 DIAGNOSIS — M9901 Segmental and somatic dysfunction of cervical region: Secondary | ICD-10-CM | POA: Diagnosis not present

## 2023-04-16 DIAGNOSIS — M5386 Other specified dorsopathies, lumbar region: Secondary | ICD-10-CM | POA: Diagnosis not present

## 2023-04-16 NOTE — Telephone Encounter (Signed)
 Copied from CRM [id number]. Topic: Clinical - Medication Question >> Apr 16, 2023  3:08 PM Kathryne Eriksson wrote: Reason for CRM: methylphenidate (CONCERTA) 36 MG PO CR tablet >> Apr 16, 2023  3:10 PM Kathryne Eriksson wrote: Patient is just now finding out that this medication has ended and she's wanting to know is there anything that can be done in order for her to get this filled before she leaves to go out of town.

## 2023-04-17 ENCOUNTER — Encounter: Payer: Self-pay | Admitting: Family Medicine

## 2023-04-17 ENCOUNTER — Telehealth (INDEPENDENT_AMBULATORY_CARE_PROVIDER_SITE_OTHER): Admitting: Family Medicine

## 2023-04-17 DIAGNOSIS — F4323 Adjustment disorder with mixed anxiety and depressed mood: Secondary | ICD-10-CM | POA: Diagnosis not present

## 2023-04-17 DIAGNOSIS — F900 Attention-deficit hyperactivity disorder, predominantly inattentive type: Secondary | ICD-10-CM

## 2023-04-17 MED ORDER — BUPROPION HCL ER (XL) 300 MG PO TB24: 300.0000 mg | ORAL_TABLET | Freq: Every day | ORAL | 3 refills | Status: AC

## 2023-04-17 MED ORDER — METHYLPHENIDATE HCL ER (OSM) 36 MG PO TBCR: 36.0000 mg | EXTENDED_RELEASE_TABLET | Freq: Every day | ORAL | 0 refills | Status: DC

## 2023-04-17 NOTE — Patient Instructions (Signed)
 I have refilled Concerta and Wellbutrin to your pharmacy.  If for some reason, they are out of Concerta, please let me know where else to send this medication.  Otherwise, follow-up in about a month for medication management with Dr. Okey Dupre for further refills of Concerta.

## 2023-04-17 NOTE — Telephone Encounter (Signed)
 Confirmed Pharmacy is added onto pt chart, please send in refill or need visit I will schedule

## 2023-04-17 NOTE — Progress Notes (Signed)
 Virtual Visit via Video Note  I connected with Terri [name] on 04/17/23 at 11:40 AM EDT by a video enabled telemedicine application and verified that I am speaking with the correct person using two identifiers.  Patient Location: Home Provider Location: Office/Clinic  I discussed the limitations, risks, security, and privacy concerns of performing an evaluation and management service by video and the availability of in person appointments. I also discussed with the patient that there may be a patient responsible charge related to this service. The patient expressed understanding and agreed to proceed.  Subjective: PCP: Myrlene Broker, MD  Chief Complaint  Patient presents with   Medication Refill    Patient is requesting refills for Concerta, and Wellbutrin   Medication Refill   58 yo F presents for AV visit today.  Video failed.  Called for audio visit. Requesting refills of Concerta and Wellbutrin today.  She is leaving for [place] in the morning.  Last refill of Concerta was 03/10/23. PDMP reviewed by me.  Reports that wellbutrin and concerta are working well for her.  Denies other concerns today.  Medical hx as outlined below.   ROS: Per HPI  Current Outpatient Medications:    diclofenac (VOLTAREN) 75 MG EC tablet, Take 1 tablet (75 mg total) by mouth daily., Disp: 3 tablet, Rfl: 0   estradiol (CLIMARA - DOSED IN MG/24 HR) 0.025 mg/24hr patch, Place 0.025 mg onto the skin once a week., Disp: , Rfl:    fluconazole (DIFLUCAN) 150 MG tablet, Take 1 tablet (150 mg total) by mouth daily., Disp: 1 tablet, Rfl: 0   fluticasone (FLONASE) 50 MCG/ACT nasal spray, SPRAY 2 SPRAYS INTO EACH NOSTRIL EVERY DAY, Disp: 16 g, Rfl: 11   gabapentin (NEURONTIN) 100 MG capsule, Take 1 capsule (100 mg total) by mouth 2 (two) times daily., Disp: 60 capsule, Rfl: 1   Hydrocortisone Butyrate 0.1 % OINT, hydrocortisone butyrate 0.1 % topical ointment, Disp: , Rfl:    Ivermectin (SOOLANTRA)  1 % CREA, Soolantra 1 % topical cream, Disp: , Rfl:    L-FORMULA LYSINE HCL PO, daily., Disp: , Rfl:    mometasone (ELOCON) 0.1 % cream, APPLY TO AFFECTED AREA EVERY DAY AS NEEDED FOR ITCH, Disp: , Rfl:    progesterone (PROMETRIUM) 100 MG capsule, Take 100 mg by mouth at bedtime., Disp: , Rfl:    Ruxolitinib Phosphate (OPZELURA) 1.5 % CREA, Apply topically daily., Disp: , Rfl:    spironolactone (ALDACTONE) 25 MG tablet, Take 25 mg by mouth daily., Disp: , Rfl:    SUMAtriptan (IMITREX) 100 MG tablet, Take 1 at onset of  Migraine headache   and can repeat in 2 hours ., Disp: 9 tablet, Rfl: 1   triamcinolone cream (KENALOG) 0.5 %, Apply 1 Application topically 2 (two) times daily. To affected areas., Disp: 30 g, Rfl: 3   valACYclovir (VALTREX) 1000 MG tablet, Take 1 tablet (1,000 mg total) by mouth 2 (two) times daily. As direted, Disp: 180 tablet, Rfl: 1   buPROPion (WELLBUTRIN XL) 300 MG 24 hr tablet, Take 1 tablet (300 mg total) by mouth daily., Disp: 90 tablet, Rfl: 3   methylphenidate (CONCERTA) 36 MG PO CR tablet, Take 1 tablet (36 mg total) by mouth daily., Disp: 30 tablet, Rfl: 0  Observations/Objective: There were no vitals filed for this visit. Physical Exam Constitutional:      Comments: Limited exam due to video failure  Neurological:     General: No focal deficit present.  Mental Status: She is alert and oriented to person, place, and time.  Psychiatric:        Mood and Affect: Mood normal.        Behavior: Behavior normal.        Thought Content: Thought content normal.     Assessment and Plan: Attention deficit hyperactivity disorder (ADHD), predominantly inattentive type -     Methylphenidate HCl ER (OSM); Take 1 tablet (36 mg total) by mouth daily.  Dispense: 30 tablet; Refill: 0  Adjustment reaction with anxiety and depression -     buPROPion HCl ER (XL); Take 1 tablet (300 mg total) by mouth daily.  Dispense: 90 tablet; Refill: 3    Follow Up  Instructions: Return in about 4 weeks (around 05/15/2023) for Med check.  I discussed the assessment and treatment plan with the patient. The patient was provided an opportunity to ask questions, and all were answered. The patient agreed with the plan and demonstrated an understanding of the instructions.   The patient was advised to call back or seek an in-person evaluation if the symptoms worsen or if the condition fails to improve as anticipated.  The above assessment and management plan was discussed with the patient. The patient verbalized understanding of and has agreed to the management plan.   I spent 12 minutes on this patient encounter including counseling, diagnosis, treatment options, documentation, face-to-face time.   Moshe Cipro, FNP

## 2023-04-17 NOTE — Telephone Encounter (Signed)
 Patient is coming in today to see Judeth Cornfield please advise. I have also sent over a mychart message in regard to this as well

## 2023-04-17 NOTE — Telephone Encounter (Signed)
 For refill requests for controlled these can take up to 48 hours. Does she have a need for urgency?

## 2023-04-18 NOTE — Telephone Encounter (Signed)
 I see a 30 day supply was given

## 2023-04-19 ENCOUNTER — Ambulatory Visit: Payer: BC Managed Care – PPO | Admitting: Family Medicine

## 2023-05-07 DIAGNOSIS — F4321 Adjustment disorder with depressed mood: Secondary | ICD-10-CM | POA: Diagnosis not present

## 2023-05-14 DIAGNOSIS — M9902 Segmental and somatic dysfunction of thoracic region: Secondary | ICD-10-CM | POA: Diagnosis not present

## 2023-05-14 DIAGNOSIS — M5386 Other specified dorsopathies, lumbar region: Secondary | ICD-10-CM | POA: Diagnosis not present

## 2023-05-14 DIAGNOSIS — M9901 Segmental and somatic dysfunction of cervical region: Secondary | ICD-10-CM | POA: Diagnosis not present

## 2023-05-14 DIAGNOSIS — M531 Cervicobrachial syndrome: Secondary | ICD-10-CM | POA: Diagnosis not present

## 2023-05-16 ENCOUNTER — Other Ambulatory Visit: Payer: Self-pay | Admitting: Internal Medicine

## 2023-05-16 DIAGNOSIS — F900 Attention-deficit hyperactivity disorder, predominantly inattentive type: Secondary | ICD-10-CM

## 2023-05-16 MED ORDER — METHYLPHENIDATE HCL ER (OSM) 36 MG PO TBCR
36.0000 mg | EXTENDED_RELEASE_TABLET | Freq: Every day | ORAL | 0 refills | Status: DC
Start: 2023-05-16 — End: 2023-06-22

## 2023-05-22 DIAGNOSIS — M531 Cervicobrachial syndrome: Secondary | ICD-10-CM | POA: Diagnosis not present

## 2023-05-22 DIAGNOSIS — M9902 Segmental and somatic dysfunction of thoracic region: Secondary | ICD-10-CM | POA: Diagnosis not present

## 2023-05-22 DIAGNOSIS — M5386 Other specified dorsopathies, lumbar region: Secondary | ICD-10-CM | POA: Diagnosis not present

## 2023-05-22 DIAGNOSIS — M9901 Segmental and somatic dysfunction of cervical region: Secondary | ICD-10-CM | POA: Diagnosis not present

## 2023-05-28 DIAGNOSIS — F4321 Adjustment disorder with depressed mood: Secondary | ICD-10-CM | POA: Diagnosis not present

## 2023-05-28 NOTE — Progress Notes (Unsigned)
 [facility] [address] [place] [address] Phone: [phone] Subjective:   Terri [name], am serving as a scribe for Dr. Ronnell Coins.  I'm seeing this patient by the request  of:  Adelia Homestead, MD  CC: Multiple joint complaint  BJY:NWGNFAOZHY  Terri [name] is a 58 y.o. female coming in with complaint of back and neck pain. OMT 02/15/2023. Patient states that she has done a lot of traveling and is tight throughout the entire spine. Has rib out on R side that occurred this morning when she woke up.    Has something out of place in the L wrist.   Wants to schedule a calcium cardiac scan. Paternal family hx of heart attack. Read about a correlation between shingles and heart issues.   Medications patient has been prescribed: None  Taking:         Reviewed prior external information including notes and imaging from previsou exam, outside providers and external EMR if available.   As well as notes that were available from care everywhere and other healthcare systems.  Past medical history, social, surgical and family history all reviewed in electronic medical record.  No pertanent information unless stated regarding to the chief complaint.   Past Medical History:  Diagnosis Date   Anxiety    Depression    Environmental allergies    GERD (gastroesophageal reflux disease)    Headache(784.0)    Recurrent sinusitis    ent and pulm eval in past.    Allergies  Allergen Reactions   Augmentin  [Amoxicillin -Pot Clavulanate] Nausea Only   Celecoxib     REACTION: unspecified   Sulfa Antibiotics    Sulfonamide Derivatives     REACTION: hives, throat swelling   Monistat [Miconazole] Other (See Comments)    Sensitive to topical vaginal  Azoles No side effects with oral Diflucan      Review of Systems:  No headache, visual changes, nausea, vomiting, diarrhea, constipation, dizziness, abdominal pain, skin rash, fevers, chills,  night sweats, weight loss, swollen lymph nodes, body aches, joint swelling, chest pain, shortness of breath, mood changes. POSITIVE muscle aches  Objective  Blood pressure 110/72, pulse 85, height 5\' 3"  (1.6 m), weight 127 lb (57.6 kg), last menstrual period 07/19/2017, SpO2 100%.   General: No apparent distress alert and oriented x3 mood and affect normal, dressed appropriately.  HEENT: Pupils equal, extraocular movements intact  Respiratory: Patient's speak in full sentences and does not appear short of breath  Cardiovascular: No lower extremity edema, non tender, no erythema  Gait relatively normal MSK:  Back hypermobility in multiple areas.  Patient is tender to palpation in the paraspinal musculature.  Osteopathic findings  C2 flexed rotated and side bent right C5 flexed rotated and side bent right T3 extended rotated and side bent right inhaled rib T9 extended rotated and side bent left L1 flexed rotated and side bent right Sacrum right on right       Assessment and Plan:  SI joint arthritis (HCC) Known arthritic changes and hypermobility.  Still potential underlying autoimmune.  Will continue to monitor.  Discussed icing regimen and home exercises, discussed which activities to do and which ones to avoid.  Increase activity slowly.  Follow-up again in 6 to 8 weeks  Degenerative disc disease, cervical Chronic problem as well.  Continue to monitor.  Discussed icing regimen and home exercises, increase activity slowly.  Follow-up again in 6 to 8 weeks  Moderate mixed hyperlipidemia not requiring  statin therapy Patient has had this checked but has had elevation previously.  With family history of multiple cardiac events will refer to cardiology to see if patient is a candidate for the coronary calcium scoring.    Nonallopathic problems  Decision today to treat with OMT was based on Physical Exam  After verbal consent patient was treated with HVLA, ME, FPR techniques in  cervical, rib, thoracic, lumbar, and sacral  areas  Patient tolerated the procedure well with improvement in symptoms  Patient given exercises, stretches and lifestyle modifications  See medications in patient instructions if given  Patient will follow up in 4-8 weeks    The above documentation has been reviewed and is accurate and complete Shanece Cochrane M Aashna Matson, DO          Note: This dictation was prepared with Dragon dictation along with smaller phrase technology. Any transcriptional errors that result from this process are unintentional.

## 2023-05-29 ENCOUNTER — Ambulatory Visit: Admitting: Family Medicine

## 2023-05-29 ENCOUNTER — Encounter: Payer: Self-pay | Admitting: Family Medicine

## 2023-05-29 VITALS — BP 110/72 | HR 85 | Ht 63.0 in | Wt 127.0 lb

## 2023-05-29 DIAGNOSIS — E785 Hyperlipidemia, unspecified: Secondary | ICD-10-CM

## 2023-05-29 DIAGNOSIS — M5030 Other cervical disc degeneration, unspecified cervical region: Secondary | ICD-10-CM

## 2023-05-29 DIAGNOSIS — M9904 Segmental and somatic dysfunction of sacral region: Secondary | ICD-10-CM | POA: Diagnosis not present

## 2023-05-29 DIAGNOSIS — M357 Hypermobility syndrome: Secondary | ICD-10-CM | POA: Diagnosis not present

## 2023-05-29 DIAGNOSIS — M461 Sacroiliitis, not elsewhere classified: Secondary | ICD-10-CM

## 2023-05-29 DIAGNOSIS — M9901 Segmental and somatic dysfunction of cervical region: Secondary | ICD-10-CM

## 2023-05-29 DIAGNOSIS — M9908 Segmental and somatic dysfunction of rib cage: Secondary | ICD-10-CM

## 2023-05-29 DIAGNOSIS — M9903 Segmental and somatic dysfunction of lumbar region: Secondary | ICD-10-CM

## 2023-05-29 DIAGNOSIS — E782 Mixed hyperlipidemia: Secondary | ICD-10-CM

## 2023-05-29 DIAGNOSIS — M9902 Segmental and somatic dysfunction of thoracic region: Secondary | ICD-10-CM

## 2023-05-29 NOTE — Assessment & Plan Note (Signed)
 Patient has had this checked but has had elevation previously.  With family history of multiple cardiac events will refer to cardiology to see if patient is a candidate for the coronary calcium scoring.

## 2023-05-29 NOTE — Assessment & Plan Note (Signed)
 Chronic problem as well.  Continue to monitor.  Discussed icing regimen and home exercises, increase activity slowly.  Follow-up again in 6 to 8 weeks

## 2023-05-29 NOTE — Assessment & Plan Note (Signed)
 Known arthritic changes and hypermobility.  Still potential underlying autoimmune.  Will continue to monitor.  Discussed icing regimen and home exercises, discussed which activities to do and which ones to avoid.  Increase activity slowly.  Follow-up again in 6 to 8 weeks

## 2023-05-29 NOTE — Patient Instructions (Addendum)
 Vascular will call you See me again in 2 months

## 2023-05-31 DIAGNOSIS — D225 Melanocytic nevi of trunk: Secondary | ICD-10-CM | POA: Diagnosis not present

## 2023-05-31 DIAGNOSIS — L814 Other melanin hyperpigmentation: Secondary | ICD-10-CM | POA: Diagnosis not present

## 2023-05-31 DIAGNOSIS — L821 Other seborrheic keratosis: Secondary | ICD-10-CM | POA: Diagnosis not present

## 2023-05-31 DIAGNOSIS — L739 Follicular disorder, unspecified: Secondary | ICD-10-CM | POA: Diagnosis not present

## 2023-06-04 DIAGNOSIS — M5386 Other specified dorsopathies, lumbar region: Secondary | ICD-10-CM | POA: Diagnosis not present

## 2023-06-04 DIAGNOSIS — M9901 Segmental and somatic dysfunction of cervical region: Secondary | ICD-10-CM | POA: Diagnosis not present

## 2023-06-04 DIAGNOSIS — M531 Cervicobrachial syndrome: Secondary | ICD-10-CM | POA: Diagnosis not present

## 2023-06-04 DIAGNOSIS — M9902 Segmental and somatic dysfunction of thoracic region: Secondary | ICD-10-CM | POA: Diagnosis not present

## 2023-06-11 DIAGNOSIS — F4321 Adjustment disorder with depressed mood: Secondary | ICD-10-CM | POA: Diagnosis not present

## 2023-06-13 ENCOUNTER — Other Ambulatory Visit: Payer: Self-pay

## 2023-06-13 DIAGNOSIS — E785 Hyperlipidemia, unspecified: Secondary | ICD-10-CM

## 2023-06-13 NOTE — Telephone Encounter (Signed)
 Test ordered

## 2023-06-13 NOTE — Telephone Encounter (Signed)
 Patient called back to follow up.

## 2023-06-14 ENCOUNTER — Encounter: Payer: Self-pay | Admitting: Internal Medicine

## 2023-06-14 ENCOUNTER — Telehealth: Payer: Self-pay

## 2023-06-14 DIAGNOSIS — F900 Attention-deficit hyperactivity disorder, predominantly inattentive type: Secondary | ICD-10-CM

## 2023-06-14 NOTE — Telephone Encounter (Signed)
 Trying to approve the cardiac scoring order. It needs a peer to peer can call at his convenience.   The ordering provider can call [phone] for a peer-to-peer discussion with a [facility] physician reviewer.  Order ID: [id number]

## 2023-06-19 NOTE — Telephone Encounter (Signed)
 Since it is self pay I will withdrawal the PA that I started

## 2023-06-20 DIAGNOSIS — M5386 Other specified dorsopathies, lumbar region: Secondary | ICD-10-CM | POA: Diagnosis not present

## 2023-06-20 DIAGNOSIS — M531 Cervicobrachial syndrome: Secondary | ICD-10-CM | POA: Diagnosis not present

## 2023-06-20 DIAGNOSIS — M9901 Segmental and somatic dysfunction of cervical region: Secondary | ICD-10-CM | POA: Diagnosis not present

## 2023-06-20 DIAGNOSIS — M9902 Segmental and somatic dysfunction of thoracic region: Secondary | ICD-10-CM | POA: Diagnosis not present

## 2023-06-21 ENCOUNTER — Other Ambulatory Visit: Payer: Self-pay | Admitting: Internal Medicine

## 2023-06-21 DIAGNOSIS — F900 Attention-deficit hyperactivity disorder, predominantly inattentive type: Secondary | ICD-10-CM

## 2023-06-21 NOTE — Telephone Encounter (Unsigned)
 Copied from CRM [id number]. Topic: Clinical - Medication Refill >> Jun 21, 2023  1:18 PM Magdalene School wrote: Medication: methylphenidate  (CONCERTA ) 36 MG PO CR tablet  Has the patient contacted their pharmacy? Yes (Agent: If no, request that the patient contact the pharmacy for the refill. If patient does not wish to contact the pharmacy document the reason why and proceed with request.) (Agent: If yes, when and what did the pharmacy advise?)  This is the patient's preferred pharmacy:  [id number] - [place], [place] - [address] AT [address] [facility] [address] [place] [address] Phone: [phone] Fax: [phone]  Is this the correct pharmacy for this prescription? Yes If no, delete pharmacy and type the correct one.   Has the prescription been filled recently? No  Is the patient out of the medication? No Will be running out on the weekend.  Has the patient been seen for an appointment in the last year OR does the patient have an upcoming appointment? Yes  Can we respond through MyChart? Yes  Agent: Please be advised that Rx refills may take up to 3 business days. We ask that you follow-up with your pharmacy.

## 2023-06-22 MED ORDER — METHYLPHENIDATE HCL ER (OSM) 36 MG PO TBCR: 36.0000 mg | EXTENDED_RELEASE_TABLET | Freq: Every day | ORAL | 0 refills | Status: DC

## 2023-06-22 NOTE — Addendum Note (Signed)
 Addended by: Bambi Lever A on: 06/22/2023 08:09 AM   Modules accepted: Orders

## 2023-07-02 DIAGNOSIS — M9901 Segmental and somatic dysfunction of cervical region: Secondary | ICD-10-CM | POA: Diagnosis not present

## 2023-07-02 DIAGNOSIS — M9902 Segmental and somatic dysfunction of thoracic region: Secondary | ICD-10-CM | POA: Diagnosis not present

## 2023-07-02 DIAGNOSIS — M9903 Segmental and somatic dysfunction of lumbar region: Secondary | ICD-10-CM | POA: Diagnosis not present

## 2023-07-02 DIAGNOSIS — M9908 Segmental and somatic dysfunction of rib cage: Secondary | ICD-10-CM | POA: Diagnosis not present

## 2023-07-02 DIAGNOSIS — M5386 Other specified dorsopathies, lumbar region: Secondary | ICD-10-CM | POA: Diagnosis not present

## 2023-07-02 DIAGNOSIS — F4321 Adjustment disorder with depressed mood: Secondary | ICD-10-CM | POA: Diagnosis not present

## 2023-07-03 DIAGNOSIS — H40013 Open angle with borderline findings, low risk, bilateral: Secondary | ICD-10-CM | POA: Diagnosis not present

## 2023-07-03 DIAGNOSIS — H02531 Eyelid retraction right upper eyelid: Secondary | ICD-10-CM | POA: Diagnosis not present

## 2023-07-03 DIAGNOSIS — H53143 Visual discomfort, bilateral: Secondary | ICD-10-CM | POA: Diagnosis not present

## 2023-07-03 DIAGNOSIS — H40053 Ocular hypertension, bilateral: Secondary | ICD-10-CM | POA: Diagnosis not present

## 2023-07-10 NOTE — Progress Notes (Signed)
 [facility] [address] [place] [address] Phone: [phone] Subjective:   IBryan Caprio, am serving as a scribe for Dr. Ronnell Coins.  I'm seeing this patient by the request  of:  Adelia Homestead, MD  CC: back and neck pain follow up   BJY:NWGNFAOZHY  Judea Fennimore is a 58 y.o. female coming in with complaint of back and neck pain. OMT on 05/29/2023. Patient states about 2 weeks ago R shoulder gave her a little trouble. Went to chiro and he put shoulder and rib back in place. Hurts to push just below clavicle. Pain in LRQ  Medications patient has been prescribed:   Taking:  CT cardiac scoring is scheduled today at 1:15 PM.    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   As well as notes that were available from care everywhere and other healthcare systems.  Past medical history, social, surgical and family history all reviewed in electronic medical record.  No pertanent information unless stated regarding to the chief complaint.   Past Medical History:  Diagnosis Date   Anxiety    Depression    Environmental allergies    GERD (gastroesophageal reflux disease)    Headache(784.0)    Recurrent sinusitis    ent and pulm eval in past.    Allergies  Allergen Reactions   Augmentin  [Amoxicillin -Pot Clavulanate] Nausea Only   Celecoxib     REACTION: unspecified   Sulfa Antibiotics    Sulfonamide Derivatives     REACTION: hives, throat swelling   Monistat [Miconazole] Other (See Comments)    Sensitive to topical vaginal  Azoles No side effects with oral Diflucan      Review of Systems:  No headache, visual changes, nausea, vomiting, diarrhea, constipation, dizziness, abdominal pain, skin rash, fevers, chills, night sweats, weight loss, swollen lymph nodes, body aches, joint swelling,  chest pain, shortness of breath, mood changes. POSITIVE muscle aches  Objective  Blood pressure 112/66, pulse 95, height 5' 3 (1.6 m), weight 125 lb (56.7 kg), last menstrual period 07/19/2017, SpO2 97%.   General: No apparent distress alert and oriented x3 mood and affect normal, dressed appropriately.  HEENT: Pupils equal, extraocular movements intact  Respiratory: Patient's speak in full sentences and does not appear short of breath  Cardiovascular: No lower extremity edema, non tender, no erythema  Gait MSK:  Back loss of lordosis.  Patient does have bruising noted on the anterior chest wall.  Seems to be in the 2nd and 3rd and 3rd and 4th areas of the soft tissue. Pelvis shows significant tightness noted on the right side of the inguinal canal as well as the hip adductors   Osteopathic findings  T9 extended rotated and side bent left L2 flexed rotated and side bent right L4 flexed rotated and side bent right Sacrum right on right Pelvic shear right      Assessment and Plan:  Arthropathy of pelvic region and thigh Discussed HEP  Discussed which activities to do and which ones to avoid.  Patient that her x-ray does have some more sclerotic changes noted.  Has had difficulty previously with hematuria and then at that point was having more of the right lower aspect pain.  Would like patient to see if we can get the records from urologist and see if any other workup would be necessary.  Patient states that  at the moment no significant dysuria.  No bowel or bladder problem.   Traumatic closed nondisplaced fracture of one rib of right side Nondisplaced rib fracture seen on the x-ray today.  Seems to be the rib 3 and potentially even 4.  Likely some cartilaginous injury and intercostal muscle injury with some mild bruising of the chest wall.  No sign of any pneumothorax.  Discussed vitamin D , icing regimen and the importance of taking deep breaths.  Follow-up with me again in 2 weeks  to make sure healing appropriately.  Will consider ultrasound at that time    Nonallopathic problems  Decision today to treat with OMT was based on Physical Exam  After verbal consent patient was treated with , ME, techniques in, lumbar, and sacral and pelvic areas  Patient tolerated the procedure well with improvement in symptoms  Patient given exercises, stretches and lifestyle modifications  See medications in patient instructions if given  Patient will follow up in 4-8 weeks    The above documentation has been reviewed and is accurate and complete Dema Timmons M Jabron Weese, DO          Note: This dictation was prepared with Dragon dictation along with smaller phrase technology. Any transcriptional errors that result from this process are unintentional.

## 2023-07-11 ENCOUNTER — Ambulatory Visit (INDEPENDENT_AMBULATORY_CARE_PROVIDER_SITE_OTHER)

## 2023-07-11 ENCOUNTER — Encounter: Payer: Self-pay | Admitting: Family Medicine

## 2023-07-11 ENCOUNTER — Ambulatory Visit: Admitting: Family Medicine

## 2023-07-11 ENCOUNTER — Ambulatory Visit: Payer: Self-pay | Admitting: Family Medicine

## 2023-07-11 ENCOUNTER — Ambulatory Visit (HOSPITAL_COMMUNITY)
Admission: RE | Admit: 2023-07-11 | Discharge: 2023-07-11 | Disposition: A | Discharge status: Home / Self Care | Source: Ambulatory Visit | Attending: Family Medicine | Admitting: Family Medicine

## 2023-07-11 VITALS — BP 112/66 | HR 95 | Ht 63.0 in | Wt 125.0 lb

## 2023-07-11 DIAGNOSIS — R0789 Other chest pain: Secondary | ICD-10-CM

## 2023-07-11 DIAGNOSIS — E785 Hyperlipidemia, unspecified: Secondary | ICD-10-CM | POA: Insufficient documentation

## 2023-07-11 DIAGNOSIS — M1610 Unilateral primary osteoarthritis, unspecified hip: Secondary | ICD-10-CM

## 2023-07-11 DIAGNOSIS — M9904 Segmental and somatic dysfunction of sacral region: Secondary | ICD-10-CM | POA: Diagnosis not present

## 2023-07-11 DIAGNOSIS — S2231XA Fracture of one rib, right side, initial encounter for closed fracture: Secondary | ICD-10-CM | POA: Diagnosis not present

## 2023-07-11 DIAGNOSIS — M9903 Segmental and somatic dysfunction of lumbar region: Secondary | ICD-10-CM | POA: Diagnosis not present

## 2023-07-11 DIAGNOSIS — M9905 Segmental and somatic dysfunction of pelvic region: Secondary | ICD-10-CM | POA: Diagnosis not present

## 2023-07-11 DIAGNOSIS — R0781 Pleurodynia: Secondary | ICD-10-CM | POA: Diagnosis not present

## 2023-07-11 DIAGNOSIS — M47816 Spondylosis without myelopathy or radiculopathy, lumbar region: Secondary | ICD-10-CM | POA: Diagnosis not present

## 2023-07-11 NOTE — Assessment & Plan Note (Addendum)
 Discussed HEP  Discussed which activities to do and which ones to avoid.  Patient that her x-ray does have some more sclerotic changes noted.  Has had difficulty previously with hematuria and then at that point was having more of the right lower aspect pain.  Would like patient to see if we can get the records from urologist and see if any other workup would be necessary.  Patient states that at the moment no significant dysuria.  No bowel or bladder problem.

## 2023-07-11 NOTE — Assessment & Plan Note (Addendum)
 Nondisplaced rib fracture seen on the x-ray today.  Seems to be the rib 3 and potentially even 4.  Likely some cartilaginous injury and intercostal muscle injury with some mild bruising of the chest wall.  No sign of any pneumothorax.  Discussed vitamin D , icing regimen and the importance of taking deep breaths.  Follow-up with me again in 2 weeks to make sure healing appropriately.  Will consider ultrasound at that time worsening pain patient should seek medical attention immediately.

## 2023-07-11 NOTE — Patient Instructions (Signed)
 10 deep breathe every hour while awake Careful with sleeping position Have appointment July 3rd just in case See you again in 2 months

## 2023-07-20 NOTE — Progress Notes (Unsigned)
  [name] [address] [place] [address] Phone: [phone] Subjective:   Terri [name], am serving as a scribe for Dr. Arthea Claudene.  I'm seeing this patient by the request  of:  [name], MD  CC: Rib pain follow-up  [name]  Terri [name] is a 58 y.o. female coming in with complaint of back and neck pain. OMT 05/29/2023.  In the interim was found to have a nondisplaced fracture of the rib on the right side.  States that she is making some progress but not as severe as what she would hope.  Patient states pain level is the same. No new symptoms.           Reviewed prior external information including notes and imaging from previsou exam, outside providers and external EMR if available.   As well as notes that were available from care everywhere and other healthcare systems.  Past medical history, social, surgical and family history all reviewed in electronic medical record.  No pertanent information unless stated regarding to the chief complaint.   Past Medical History:  Diagnosis Date   Anxiety    Depression    Environmental allergies    GERD (gastroesophageal reflux disease)    Headache(784.0)    Recurrent sinusitis    ent and pulm eval in past.    Allergies  Allergen Reactions   Augmentin  [Amoxicillin -Pot Clavulanate] Nausea Only   Celecoxib     REACTION: unspecified   Sulfa Antibiotics    Sulfonamide Derivatives     REACTION: hives, throat swelling   Monistat [Miconazole] Other (See Comments)    Sensitive to topical vaginal  Azoles No side effects with oral Diflucan      Review of Systems:  No headache, visual changes, nausea, vomiting, diarrhea, constipation, dizziness, abdominal pain, skin rash, fevers, chills, night sweats, weight loss, swollen lymph nodes, body aches, joint swelling, chest pain, shortness of breath, mood changes. POSITIVE muscle aches  Objective  Blood pressure 110/80, pulse  83, height 5' 3 (1.6 m), last menstrual period 07/19/2017, SpO2 98%.   General: No apparent distress alert and oriented x3 mood and affect normal, dressed appropriately.  HEENT: Pupils equal, extraocular movements intact  Respiratory: Patient's speak in full sentences and does not appear short of breath  Cardiovascular: No lower extremity edema, non tender, no erythema  Still severely tender to palpation more over the anterior chest wall but no bruising appreciated.  Tightness with certain range of motion noted.       Assessment and Plan:  Traumatic closed nondisplaced fracture of one rib of right side Patient is healing at this time.  No significant change in management.  Given prednisone  in case patient has any difficulty with her trip.  Increase activity slowly otherwise.  Discussed icing regimen.  Follow-up again in 6 to 8 weeks otherwise.  We did discuss with patient that if pain does not resolve then further imaging for any other ocular symptoms or possible nerve impingement would be necessary would which would include an MRI of the cervical spine and MRI of the chest.        The above documentation has been reviewed and is accurate and complete Terri [name] M Terri Kistner, DO         Note: This dictation was prepared with Dragon dictation along with smaller phrase technology. Any transcriptional errors that result from this process are unintentional.

## 2023-07-23 ENCOUNTER — Encounter: Payer: Self-pay | Admitting: Internal Medicine

## 2023-07-23 DIAGNOSIS — F900 Attention-deficit hyperactivity disorder, predominantly inattentive type: Secondary | ICD-10-CM

## 2023-07-23 DIAGNOSIS — F4321 Adjustment disorder with depressed mood: Secondary | ICD-10-CM | POA: Diagnosis not present

## 2023-07-24 MED ORDER — METHYLPHENIDATE HCL ER (OSM) 36 MG PO TBCR: 36.0000 mg | EXTENDED_RELEASE_TABLET | Freq: Every day | ORAL | 0 refills | Status: DC

## 2023-07-24 NOTE — Telephone Encounter (Signed)
 Please send in medication for patient

## 2023-07-26 ENCOUNTER — Ambulatory Visit: Admitting: Family Medicine

## 2023-07-26 VITALS — BP 110/80 | HR 83 | Ht 63.0 in

## 2023-07-26 DIAGNOSIS — S2231XA Fracture of one rib, right side, initial encounter for closed fracture: Secondary | ICD-10-CM

## 2023-07-26 DIAGNOSIS — S2231XD Fracture of one rib, right side, subsequent encounter for fracture with routine healing: Secondary | ICD-10-CM | POA: Diagnosis not present

## 2023-07-26 MED ORDER — PREDNISONE 20 MG PO TABS: 20.0000 mg | ORAL_TABLET | Freq: Every day | ORAL | 0 refills | Status: AC

## 2023-07-26 NOTE — Patient Instructions (Addendum)
 Prednisone  if needed while at the beach Good to see you! Okay to use the massage gun

## 2023-07-26 NOTE — Assessment & Plan Note (Addendum)
 Patient is healing at this time.  No significant change in management.  Given prednisone  in case patient has any difficulty with her trip.  Increase activity slowly otherwise.  Discussed icing regimen.  Follow-up again in 6 to 8 weeks otherwise.  We did discuss with patient that if pain does not resolve then further imaging for any other ocular symptoms or possible nerve impingement would be necessary would which would include an MRI of the cervical spine and MRI of the chest.

## 2023-08-13 DIAGNOSIS — M9901 Segmental and somatic dysfunction of cervical region: Secondary | ICD-10-CM | POA: Diagnosis not present

## 2023-08-13 DIAGNOSIS — M9903 Segmental and somatic dysfunction of lumbar region: Secondary | ICD-10-CM | POA: Diagnosis not present

## 2023-08-13 DIAGNOSIS — M5386 Other specified dorsopathies, lumbar region: Secondary | ICD-10-CM | POA: Diagnosis not present

## 2023-08-13 DIAGNOSIS — L718 Other rosacea: Secondary | ICD-10-CM | POA: Diagnosis not present

## 2023-08-13 DIAGNOSIS — M9908 Segmental and somatic dysfunction of rib cage: Secondary | ICD-10-CM | POA: Diagnosis not present

## 2023-08-13 DIAGNOSIS — L728 Other follicular cysts of the skin and subcutaneous tissue: Secondary | ICD-10-CM | POA: Diagnosis not present

## 2023-08-13 DIAGNOSIS — M9902 Segmental and somatic dysfunction of thoracic region: Secondary | ICD-10-CM | POA: Diagnosis not present

## 2023-08-14 DIAGNOSIS — M9901 Segmental and somatic dysfunction of cervical region: Secondary | ICD-10-CM | POA: Diagnosis not present

## 2023-08-14 DIAGNOSIS — M9903 Segmental and somatic dysfunction of lumbar region: Secondary | ICD-10-CM | POA: Diagnosis not present

## 2023-08-14 DIAGNOSIS — M9902 Segmental and somatic dysfunction of thoracic region: Secondary | ICD-10-CM | POA: Diagnosis not present

## 2023-08-14 DIAGNOSIS — M5386 Other specified dorsopathies, lumbar region: Secondary | ICD-10-CM | POA: Diagnosis not present

## 2023-08-20 DIAGNOSIS — M5386 Other specified dorsopathies, lumbar region: Secondary | ICD-10-CM | POA: Diagnosis not present

## 2023-08-20 DIAGNOSIS — M9903 Segmental and somatic dysfunction of lumbar region: Secondary | ICD-10-CM | POA: Diagnosis not present

## 2023-08-20 DIAGNOSIS — M9901 Segmental and somatic dysfunction of cervical region: Secondary | ICD-10-CM | POA: Diagnosis not present

## 2023-08-20 DIAGNOSIS — M9902 Segmental and somatic dysfunction of thoracic region: Secondary | ICD-10-CM | POA: Diagnosis not present

## 2023-08-22 DIAGNOSIS — F4321 Adjustment disorder with depressed mood: Secondary | ICD-10-CM | POA: Diagnosis not present

## 2023-08-27 ENCOUNTER — Encounter: Payer: Self-pay | Admitting: Internal Medicine

## 2023-08-27 DIAGNOSIS — F900 Attention-deficit hyperactivity disorder, predominantly inattentive type: Secondary | ICD-10-CM

## 2023-08-27 MED ORDER — METHYLPHENIDATE HCL ER (OSM) 36 MG PO TBCR: 36.0000 mg | EXTENDED_RELEASE_TABLET | Freq: Every day | ORAL | 0 refills | Status: DC

## 2023-08-30 ENCOUNTER — Ambulatory Visit: Admitting: Family Medicine

## 2023-09-10 DIAGNOSIS — M9903 Segmental and somatic dysfunction of lumbar region: Secondary | ICD-10-CM | POA: Diagnosis not present

## 2023-09-10 DIAGNOSIS — M5386 Other specified dorsopathies, lumbar region: Secondary | ICD-10-CM | POA: Diagnosis not present

## 2023-09-10 DIAGNOSIS — M9902 Segmental and somatic dysfunction of thoracic region: Secondary | ICD-10-CM | POA: Diagnosis not present

## 2023-09-10 DIAGNOSIS — M9901 Segmental and somatic dysfunction of cervical region: Secondary | ICD-10-CM | POA: Diagnosis not present

## 2023-09-11 DIAGNOSIS — F4321 Adjustment disorder with depressed mood: Secondary | ICD-10-CM | POA: Diagnosis not present

## 2023-09-20 DIAGNOSIS — H25043 Posterior subcapsular polar age-related cataract, bilateral: Secondary | ICD-10-CM | POA: Diagnosis not present

## 2023-09-20 DIAGNOSIS — H18413 Arcus senilis, bilateral: Secondary | ICD-10-CM | POA: Diagnosis not present

## 2023-09-20 DIAGNOSIS — H25013 Cortical age-related cataract, bilateral: Secondary | ICD-10-CM | POA: Diagnosis not present

## 2023-09-20 DIAGNOSIS — H2513 Age-related nuclear cataract, bilateral: Secondary | ICD-10-CM | POA: Diagnosis not present

## 2023-09-20 DIAGNOSIS — H2511 Age-related nuclear cataract, right eye: Secondary | ICD-10-CM | POA: Diagnosis not present

## 2023-09-25 DIAGNOSIS — M9902 Segmental and somatic dysfunction of thoracic region: Secondary | ICD-10-CM | POA: Diagnosis not present

## 2023-09-25 DIAGNOSIS — F4321 Adjustment disorder with depressed mood: Secondary | ICD-10-CM | POA: Diagnosis not present

## 2023-09-25 DIAGNOSIS — M9901 Segmental and somatic dysfunction of cervical region: Secondary | ICD-10-CM | POA: Diagnosis not present

## 2023-09-25 DIAGNOSIS — M9903 Segmental and somatic dysfunction of lumbar region: Secondary | ICD-10-CM | POA: Diagnosis not present

## 2023-09-25 DIAGNOSIS — M5386 Other specified dorsopathies, lumbar region: Secondary | ICD-10-CM | POA: Diagnosis not present

## 2023-09-26 NOTE — Progress Notes (Unsigned)
 [name] [address] [place] [address] Phone: [phone] Subjective:   Terri [name], am serving as a scribe for Dr. Arthea Claudene.  I'm seeing this patient by the request  of:  [name], MD  CC: Right shoulder pain  [name]  07/26/2023 Patient is healing at this time.  No significant change in management.  Given prednisone  in case patient has any difficulty with her trip.  Increase activity slowly otherwise.  Discussed icing regimen.  Follow-up again in 6 to 8 weeks otherwise.  We did discuss with patient that if pain does not resolve then further imaging for any other ocular symptoms or possible nerve impingement would be necessary would which would include an MRI of the cervical spine and MRI of the chest.      Update 09/27/2023 Terri [name] is a 58 y.o. female coming in with complaint of rib fx f/u. Patient states that her rib pain just stopped hurting. R shoulder pain in anterior aspect over coracoid process. Painful to abduct arm. Seems to have less swelling and tenderness in the rib area.   Has made some ergonomic changes at work which has been helpful to R ribs.  Feels like she might need another epidural. Had one previously through spine and scoliosis.       Past Medical History:  Diagnosis Date   Anxiety    Depression    Environmental allergies    GERD (gastroesophageal reflux disease)    Headache(784.0)    Recurrent sinusitis    ent and pulm eval in past.   Past Surgical History:  Procedure Laterality Date   HIP ARTHROSCOPY W/ LABRAL DEBRIDEMENT Left    At [facility]   Social History   Socioeconomic History   Marital status: Single    Spouse name: Not on file   Number of children: Not on file   Years of education: Not on file   Highest education level: Not on file  Occupational History   Not on file  Tobacco Use   Smoking status: Never    Passive exposure: Never   Smokeless tobacco: Never   Vaping Use   Vaping status: Never Used  Substance and Sexual Activity   Alcohol use: Yes    Comment: Occasional use   Drug use: No   Sexual activity: Yes    Birth control/protection: Pill  Other Topics Concern   Not on file  Social History Narrative   esthetician   Non smoker   HH of 1    Social Drivers of Corporate investment banker Strain: Not on file  Food Insecurity: Not on file  Transportation Needs: Not on file  Physical Activity: Not on file  Stress: Not on file  Social Connections: Unknown (06/07/2021)   Received from [employer]   Social Network    Social Network: Not on file   Allergies  Allergen Reactions   Augmentin  [Amoxicillin -Pot Clavulanate] Nausea Only   Celecoxib     REACTION: unspecified   Sulfa Antibiotics    Sulfonamide Derivatives     REACTION: hives, throat swelling   Monistat [Miconazole] Other (See Comments)    Sensitive to topical vaginal  Azoles No side effects with oral Diflucan    Family History  Problem Relation Age of Onset   Diabetes Father    Heart attack Father    Leukemia Sister    Heart disease Other        fhx    Current Outpatient  Medications (Endocrine & Metabolic):    estradiol  (CLIMARA  - DOSED IN MG/24 HR) 0.025 mg/24hr patch, Place 0.025 mg onto the skin once a week.   predniSONE  (DELTASONE ) 20 MG tablet, Take 1 tablet (20 mg total) by mouth daily with breakfast.   progesterone (PROMETRIUM) 100 MG capsule, Take 100 mg by mouth at bedtime.  Current Outpatient Medications (Cardiovascular):    spironolactone (ALDACTONE) 25 MG tablet, Take 25 mg by mouth daily.  Current Outpatient Medications (Respiratory):    fluticasone  (FLONASE ) 50 MCG/ACT nasal spray, SPRAY 2 SPRAYS INTO EACH NOSTRIL EVERY DAY  Current Outpatient Medications (Analgesics):    diclofenac  (VOLTAREN ) 75 MG EC tablet, Take 1 tablet (75 mg total) by mouth daily.   SUMAtriptan  (IMITREX ) 100 MG tablet, Take 1 at onset of  Migraine headache   and can  repeat in 2 hours .   Current Outpatient Medications (Other):    buPROPion  (WELLBUTRIN  XL) 300 MG 24 hr tablet, Take 1 tablet (300 mg total) by mouth daily.   fluconazole  (DIFLUCAN ) 150 MG tablet, Take 1 tablet (150 mg total) by mouth daily.   gabapentin  (NEURONTIN ) 100 MG capsule, Take 1 capsule (100 mg total) by mouth 2 (two) times daily.   Hydrocortisone Butyrate 0.1 % OINT, hydrocortisone butyrate 0.1 % topical ointment   Ivermectin (SOOLANTRA) 1 % CREA, Soolantra 1 % topical cream   L-FORMULA LYSINE HCL PO, daily.   methylphenidate  (CONCERTA ) 36 MG PO CR tablet, Take 1 tablet (36 mg total) by mouth daily.   mometasone (ELOCON) 0.1 % cream, APPLY TO AFFECTED AREA EVERY DAY AS NEEDED FOR ITCH   Ruxolitinib Phosphate (OPZELURA) 1.5 % CREA, Apply topically daily.   triamcinolone  cream (KENALOG ) 0.5 %, Apply 1 Application topically 2 (two) times daily. To affected areas.   valACYclovir  (VALTREX ) 1000 MG tablet, Take 1 tablet (1,000 mg total) by mouth 2 (two) times daily. As direted   Reviewed prior external information including notes and imaging from  primary care provider As well as notes that were available from care everywhere and other healthcare systems.  Past medical history, social, surgical and family history all reviewed in electronic medical record.  No pertanent information unless stated regarding to the chief complaint.   Review of Systems:  No headache, visual changes, nausea, vomiting, diarrhea, constipation, dizziness, abdominal pain, skin rash, fevers, chills, night sweats, weight loss, swollen lymph nodes, body aches, joint swelling, chest pain, shortness of breath, mood changes. POSITIVE muscle aches  Objective  Blood pressure 108/72, pulse 62, height 5' 3 (1.6 m), weight 123 lb (55.8 kg), last menstrual period 07/19/2017, SpO2 98%.   General: No apparent distress alert and oriented x3 mood and affect normal, dressed appropriately.  HEENT: Pupils equal, extraocular  movements intact  Respiratory: Patient's speak in full sentences and does not appear short of breath  Cardiovascular: No lower extremity edema, non tender, no erythema  Right shoulder exam shows the patient does have positive impingement noted.  Rotator cuff strength 4 out of 5 but seems to be relatively symmetric to the contralateral side.  Positive crossover noted.  Limited muscular skeletal ultrasound was performed and interpreted by CLAUDENE HUSSAR, M  Limited ultrasound shows that patient does have some degenerative and potentially acute on chronic tearing noted of the supraspinatus.  Hypoechoic changes of the subscapularis noted with likely bursitis.  Moderate effusion noted of the acromioclavicular joint also noted today. 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  This included exercises focusing on stretching, strengthening, with significant focus on eccentric aspects.   Long term goals include an improvement in range of motion, strength, endurance as well as avoiding reinjury. Patient's frequency would include in 1-2 times a day, 3-5 times a week for a duration of 6-12 weeks. Shoulder Exercises that included:  Basic scapular stabilization to include adduction and depression of scapula Scaption, focusing on proper movement and good control Internal and External rotation utilizing a theraband, with elbow tucked at side entire time Rows with theraband    Proper technique shown and discussed handout in great detail with ATC.  All questions were discussed and answered.     Impression and Recommendations:

## 2023-09-27 ENCOUNTER — Ambulatory Visit: Admitting: Family Medicine

## 2023-09-27 ENCOUNTER — Encounter: Payer: Self-pay | Admitting: Family Medicine

## 2023-09-27 ENCOUNTER — Other Ambulatory Visit: Payer: Self-pay

## 2023-09-27 VITALS — BP 108/72 | HR 62 | Ht 63.0 in | Wt 123.0 lb

## 2023-09-27 DIAGNOSIS — M25511 Pain in right shoulder: Secondary | ICD-10-CM | POA: Diagnosis not present

## 2023-09-27 DIAGNOSIS — M75101 Unspecified rotator cuff tear or rupture of right shoulder, not specified as traumatic: Secondary | ICD-10-CM | POA: Insufficient documentation

## 2023-09-27 DIAGNOSIS — M75111 Incomplete rotator cuff tear or rupture of right shoulder, not specified as traumatic: Secondary | ICD-10-CM

## 2023-09-27 NOTE — Assessment & Plan Note (Addendum)
 Concern for partial tearing noted.  I will concern Patient does have a significant amount of inflammation.  We discussed the possibility of other potential injections.  Patient wanted to hold at this time.  Will see how she does with conservative therapy.  Increase activity, discussed icing regimen.  Follow-up again in 6 to 8 weeks otherwise.  If worsening pain advance imaging would be warranted as well as potential injections.

## 2023-09-27 NOTE — Patient Instructions (Addendum)
  Keep doing exercises Keep hands in peripheral vision See me in 6-8 weeks

## 2023-09-28 ENCOUNTER — Encounter: Payer: Self-pay | Admitting: Internal Medicine

## 2023-09-28 DIAGNOSIS — F900 Attention-deficit hyperactivity disorder, predominantly inattentive type: Secondary | ICD-10-CM

## 2023-09-28 MED ORDER — METHYLPHENIDATE HCL ER (OSM) 36 MG PO TBCR: 36.0000 mg | EXTENDED_RELEASE_TABLET | Freq: Every day | ORAL | 0 refills | Status: DC

## 2023-09-28 NOTE — Telephone Encounter (Signed)
Pharmacy is on file

## 2023-10-11 DIAGNOSIS — F4321 Adjustment disorder with depressed mood: Secondary | ICD-10-CM | POA: Diagnosis not present

## 2023-11-01 ENCOUNTER — Encounter: Payer: Self-pay | Admitting: Internal Medicine

## 2023-11-05 ENCOUNTER — Other Ambulatory Visit: Payer: Self-pay | Admitting: Internal Medicine

## 2023-11-05 DIAGNOSIS — F900 Attention-deficit hyperactivity disorder, predominantly inattentive type: Secondary | ICD-10-CM

## 2023-11-05 MED ORDER — METHYLPHENIDATE HCL ER (OSM) 36 MG PO TBCR: 36.0000 mg | EXTENDED_RELEASE_TABLET | Freq: Every day | ORAL | 0 refills | Status: DC

## 2023-11-05 NOTE — Telephone Encounter (Signed)
 Please see refill request.

## 2023-11-05 NOTE — Telephone Encounter (Signed)
 Copied from CRM [id number]. Topic: Clinical - Medication Refill >> Nov 05, 2023 10:33 AM Eva FALCON wrote: Medication:  methylphenidate  (CONCERTA ) 36 MG PO CR tablet   Has the patient contacted their pharmacy? Yes (Agent: If no, request that the patient contact the pharmacy for the refill. If patient does not wish to contact the pharmacy document the reason why and proceed with request.) (Agent: If yes, when and what did the pharmacy advise?)  This is the patient's preferred pharmacy:  [id number] - [place], [place] - [address] AT CORNER OF [facility] [address] [place] [address] Phone: [phone] Fax: [phone]  Is this the correct pharmacy for this prescription? Yes If no, delete pharmacy and type the correct one.   Has the prescription been filled recently? Yes  Is the patient out of the medication? No  Has the patient been seen for an appointment in the last year OR does the patient have an upcoming appointment? Yes  Can we respond through MyChart? Yes  Agent: Please be advised that Rx refills may take up to 3 business days. We ask that you follow-up with your pharmacy.

## 2023-11-07 NOTE — Progress Notes (Unsigned)
 [name] [address] [place] [address] Phone: [phone] Subjective:   [name], am serving as a scribe for Dr. Arthea Claudene.  I'm seeing this patient by the request  of:  [name], MD  CC: back neck and shoulder pain   [name]  [name] is a 58 y.o. female coming in with complaint of back and neck pain.  Also potential follow-up for the shoulder.  OMT in July but seen for R shoulder in September. Patient states L eye has been spasming and one morning wouldn't open. Neck musculature. We are doing MSK today. New epidural?  Medications patient has been prescribed: None          Reviewed prior external information including notes and imaging from previsou exam, outside providers and external EMR if available.   As well as notes that were available from care everywhere and other healthcare systems.  Past medical history, social, surgical and family history all reviewed in electronic medical record.  No pertanent information unless stated regarding to the chief complaint.   Past Medical History:  Diagnosis Date   Anxiety    Depression    Environmental allergies    GERD (gastroesophageal reflux disease)    Headache(784.0)    Recurrent sinusitis    ent and pulm eval in past.    Allergies  Allergen Reactions   Augmentin  [Amoxicillin -Pot Clavulanate] Nausea Only   Celecoxib     REACTION: unspecified   Sulfa Antibiotics    Sulfonamide Derivatives     REACTION: hives, throat swelling   Monistat [Miconazole] Other (See Comments)    Sensitive to topical vaginal  Azoles No side effects with oral Diflucan      Review of Systems:  No headache, visual changes, nausea, vomiting, diarrhea, constipation, dizziness, abdominal pain, skin rash, fevers, chills, night sweats, weight loss, swollen lymph nodes, body aches, joint swelling, chest pain, shortness of breath, mood changes. POSITIVE muscle  aches  Objective  Blood pressure 122/74, pulse 83, height 5' 3 (1.6 m), weight 129 lb (58.5 kg), last menstrual period 07/19/2017, SpO2 98%.   General: No apparent distress alert and oriented x3 mood and affect normal, dressed appropriately.  HEENT: Pupils equal, extraocular movements intact  Respiratory: Patient's speak in full sentences and does not appear short of breath  Cardiovascular: No lower extremity edema, non tender, no erythema  Gait normal MSK:  Back does have some mild loss of lordosis noted.  Hypermobility noted, tightness noted in all listening knees.  Limited sidebending of the neck bilaterally.  Multiple slipped ribs noted in the upper right side of the parascapular area.  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  Patient is going to continue to work on strengthening exercises.  No other significant changes in management.  Follow-up with me again in 2 months    Nonallopathic problems  Decision today to treat with OMT was based on Physical Exam  After verbal consent patient was treated with ME, FPR techniques in cervical, rib, thoracic, lumbar, and sacral  areas  Patient tolerated the procedure well with improvement in symptoms  Patient given exercises, stretches and lifestyle modifications  See medications in patient instructions if given  Patient will follow up in 4-8 weeks     The above documentation has been reviewed and is accurate and complete Saket Hellstrom M Kester Stimpson, DO         Note: This dictation was prepared  with Dragon dictation along with smaller phrase technology. Any transcriptional errors that result from this process are unintentional.

## 2023-11-08 ENCOUNTER — Ambulatory Visit: Admitting: Family Medicine

## 2023-11-08 ENCOUNTER — Encounter: Payer: Self-pay | Admitting: Family Medicine

## 2023-11-08 VITALS — BP 122/74 | HR 83 | Ht 63.0 in | Wt 129.0 lb

## 2023-11-08 DIAGNOSIS — M9903 Segmental and somatic dysfunction of lumbar region: Secondary | ICD-10-CM

## 2023-11-08 DIAGNOSIS — R5383 Other fatigue: Secondary | ICD-10-CM

## 2023-11-08 DIAGNOSIS — M940 Chondrocostal junction syndrome [Tietze]: Secondary | ICD-10-CM | POA: Diagnosis not present

## 2023-11-08 DIAGNOSIS — M357 Hypermobility syndrome: Secondary | ICD-10-CM

## 2023-11-08 DIAGNOSIS — M9908 Segmental and somatic dysfunction of rib cage: Secondary | ICD-10-CM

## 2023-11-08 DIAGNOSIS — M9904 Segmental and somatic dysfunction of sacral region: Secondary | ICD-10-CM | POA: Diagnosis not present

## 2023-11-08 DIAGNOSIS — M9902 Segmental and somatic dysfunction of thoracic region: Secondary | ICD-10-CM

## 2023-11-08 DIAGNOSIS — M9901 Segmental and somatic dysfunction of cervical region: Secondary | ICD-10-CM | POA: Diagnosis not present

## 2023-11-08 DIAGNOSIS — D519 Vitamin B12 deficiency anemia, unspecified: Secondary | ICD-10-CM | POA: Diagnosis not present

## 2023-11-08 MED ORDER — CYANOCOBALAMIN 1000 MCG/ML IJ SOLN
1000.0000 ug | Freq: Once | INTRAMUSCULAR | Status: AC
  Administered 2023-11-08: 1000 ug via INTRAMUSCULAR

## 2023-11-08 NOTE — Patient Instructions (Addendum)
 Use eye drops more often Dry eyes B 12 injection today. See you again in 6-8 weeks

## 2023-11-08 NOTE — Assessment & Plan Note (Signed)
 Continues to have difficulty secondary to the hypermobility noted, discussed icing regimen and home exercises, discussed avoiding certain activities.  Patient is going to continue to work on strengthening exercises.  No other significant changes in management.  Follow-up with me again in 2 months

## 2023-11-19 DIAGNOSIS — M9903 Segmental and somatic dysfunction of lumbar region: Secondary | ICD-10-CM | POA: Diagnosis not present

## 2023-11-19 DIAGNOSIS — M9902 Segmental and somatic dysfunction of thoracic region: Secondary | ICD-10-CM | POA: Diagnosis not present

## 2023-11-19 DIAGNOSIS — M5386 Other specified dorsopathies, lumbar region: Secondary | ICD-10-CM | POA: Diagnosis not present

## 2023-11-19 DIAGNOSIS — M9901 Segmental and somatic dysfunction of cervical region: Secondary | ICD-10-CM | POA: Diagnosis not present

## 2023-12-04 DIAGNOSIS — M9901 Segmental and somatic dysfunction of cervical region: Secondary | ICD-10-CM | POA: Diagnosis not present

## 2023-12-04 DIAGNOSIS — M5386 Other specified dorsopathies, lumbar region: Secondary | ICD-10-CM | POA: Diagnosis not present

## 2023-12-04 DIAGNOSIS — M531 Cervicobrachial syndrome: Secondary | ICD-10-CM | POA: Diagnosis not present

## 2023-12-04 DIAGNOSIS — M9902 Segmental and somatic dysfunction of thoracic region: Secondary | ICD-10-CM | POA: Diagnosis not present

## 2023-12-06 ENCOUNTER — Encounter: Payer: Self-pay | Admitting: Internal Medicine

## 2023-12-06 DIAGNOSIS — F900 Attention-deficit hyperactivity disorder, predominantly inattentive type: Secondary | ICD-10-CM

## 2023-12-06 DIAGNOSIS — M5416 Radiculopathy, lumbar region: Secondary | ICD-10-CM | POA: Diagnosis not present

## 2023-12-06 DIAGNOSIS — M4316 Spondylolisthesis, lumbar region: Secondary | ICD-10-CM | POA: Diagnosis not present

## 2023-12-06 DIAGNOSIS — Z1330 Encounter for screening examination for mental health and behavioral disorders, unspecified: Secondary | ICD-10-CM | POA: Diagnosis not present

## 2023-12-06 NOTE — Progress Notes (Signed)
 [name] [address] [place] [address] Phone: [phone] Subjective:   Terri [name], am serving as a scribe for Dr. Arthea [name].  I'm seeing this patient by the request  of:  [name], MD  CC: Multiple joint complaints  [name]  Terri [name] is a 58 y.o. female coming in with complaint of back and neck pain. OMT on 11/08/2023. Patient states that her pain has increased. She feels like she needs another epidural. Getting Mri on December 1st. Pain with sit to stand. Pain in lumbar spine and into the B lateral hamstring and into the L hip flexor.   Also feels like she has a rib out and also some pain in the L trap.   Having cataract surgery in December.   Wants to know if steroid injections can cause atrophy of tissue beneath surface of the skin. She has had a couple of injections in face and feels like there is small indentation. Did have cellulitis in this area as well.           Reviewed prior external information including notes and imaging from previsou exam, outside providers and external EMR if available.   As well as notes that were available from care everywhere and other healthcare systems.  Past medical history, social, surgical and family history all reviewed in electronic medical record.  No pertanent information unless stated regarding to the chief complaint.   Past Medical History:  Diagnosis Date   Anxiety    Depression    Environmental allergies    GERD (gastroesophageal reflux disease)    Headache(784.0)    Recurrent sinusitis    ent and pulm eval in past.    Allergies  Allergen Reactions   Augmentin  [Amoxicillin -Pot Clavulanate] Nausea Only   Celecoxib     REACTION: unspecified   Sulfa Antibiotics    Sulfonamide Derivatives     REACTION: hives, throat swelling   Monistat [Miconazole] Other (See Comments)    Sensitive to topical vaginal  Azoles No side effects with oral Diflucan       Review of Systems:  No headache, visual changes, nausea, vomiting, diarrhea, constipation, dizziness, abdominal pain, skin rash, fevers, chills, night sweats, weight loss, swollen lymph nodes, body aches, joint swelling, chest pain, shortness of breath, mood changes. POSITIVE muscle aches  Objective  Blood pressure 132/80, pulse 95, height 5' 3 (1.6 m), weight 130 lb (59 kg), last menstrual period 07/19/2017, SpO2 96%.   General: No apparent distress alert and oriented x3 mood and affect normal, dressed appropriately.  HEENT: Pupils equal, extraocular movements intact  Respiratory: Patient's speak in full sentences and does not appear short of breath  Cardiovascular: No lower extremity edema, non tender, no erythema  Gait MSK:  Back severe loss lordosis of the lumbar spine.  Tightness noted.  Tenderness to palpation in the paraspinal musculature.  Positive Deri.  Does have hypermobility of other joints though noted.  Some instability noted.  Osteopathic findings  C2 flexed rotated and side bent right C7 flexed rotated and side bent left T3 extended rotated and side bent right inhaled rib T9 extended rotated and side bent left L2 flexed rotated and side bent right Sacrum right on right    Assessment and Plan:  Benign hypermobility syndrome Hypermobility as well as I do believe patient also has underlying autoimmune disease that can contribute.  Patient's back pain is worse at the moment.  Would respond well to an epidural she has  had previously.  Discussed icing regimen and home exercises, discussed avoiding certain activities.  Discussed core strengthening.  Follow-up again in 6 to 8 weeks otherwise.    Nonallopathic problems  Decision today to treat with OMT was based on Physical Exam  After verbal consent patient was treated with HVLA, ME, FPR techniques in cervical, rib, thoracic, lumbar, and sacral  areas  Patient tolerated the procedure well with improvement in  symptoms  Patient given exercises, stretches and lifestyle modifications  See medications in patient instructions if given  Patient will follow up in 4-8 weeks    The above documentation has been reviewed and is accurate and complete Terri Warga M Dovie Kapusta, DO          Note: This dictation was prepared with Dragon dictation along with smaller phrase technology. Any transcriptional errors that result from this process are unintentional.

## 2023-12-07 MED ORDER — METHYLPHENIDATE HCL ER (OSM) 36 MG PO TBCR: 36.0000 mg | EXTENDED_RELEASE_TABLET | Freq: Every day | ORAL | 0 refills | Status: DC

## 2023-12-10 ENCOUNTER — Encounter: Payer: Self-pay | Admitting: Family Medicine

## 2023-12-10 ENCOUNTER — Ambulatory Visit (INDEPENDENT_AMBULATORY_CARE_PROVIDER_SITE_OTHER): Admitting: Family Medicine

## 2023-12-10 VITALS — BP 132/80 | HR 95 | Ht 63.0 in | Wt 130.0 lb

## 2023-12-10 DIAGNOSIS — M9908 Segmental and somatic dysfunction of rib cage: Secondary | ICD-10-CM

## 2023-12-10 DIAGNOSIS — M9902 Segmental and somatic dysfunction of thoracic region: Secondary | ICD-10-CM

## 2023-12-10 DIAGNOSIS — M9901 Segmental and somatic dysfunction of cervical region: Secondary | ICD-10-CM | POA: Diagnosis not present

## 2023-12-10 DIAGNOSIS — M9903 Segmental and somatic dysfunction of lumbar region: Secondary | ICD-10-CM | POA: Diagnosis not present

## 2023-12-10 DIAGNOSIS — M357 Hypermobility syndrome: Secondary | ICD-10-CM

## 2023-12-10 DIAGNOSIS — M9904 Segmental and somatic dysfunction of sacral region: Secondary | ICD-10-CM

## 2023-12-10 NOTE — Assessment & Plan Note (Signed)
 Hypermobility as well as I do believe patient also has underlying autoimmune disease that can contribute.  Patient's back pain is worse at the moment.  Would respond well to an epidural she has had previously.  Discussed icing regimen and home exercises, discussed avoiding certain activities.  Discussed core strengthening.  Follow-up again in 6 to 8 weeks otherwise.

## 2023-12-10 NOTE — Patient Instructions (Signed)
 See me in 6-8 weeks

## 2023-12-11 DIAGNOSIS — Z1231 Encounter for screening mammogram for malignant neoplasm of breast: Secondary | ICD-10-CM | POA: Diagnosis not present

## 2023-12-17 DIAGNOSIS — M5386 Other specified dorsopathies, lumbar region: Secondary | ICD-10-CM | POA: Diagnosis not present

## 2023-12-17 DIAGNOSIS — M9901 Segmental and somatic dysfunction of cervical region: Secondary | ICD-10-CM | POA: Diagnosis not present

## 2023-12-17 DIAGNOSIS — M9902 Segmental and somatic dysfunction of thoracic region: Secondary | ICD-10-CM | POA: Diagnosis not present

## 2023-12-17 DIAGNOSIS — M531 Cervicobrachial syndrome: Secondary | ICD-10-CM | POA: Diagnosis not present

## 2023-12-24 DIAGNOSIS — M4316 Spondylolisthesis, lumbar region: Secondary | ICD-10-CM | POA: Diagnosis not present

## 2023-12-24 DIAGNOSIS — M5416 Radiculopathy, lumbar region: Secondary | ICD-10-CM | POA: Diagnosis not present

## 2023-12-24 DIAGNOSIS — M48061 Spinal stenosis, lumbar region without neurogenic claudication: Secondary | ICD-10-CM | POA: Diagnosis not present

## 2023-12-25 ENCOUNTER — Encounter: Payer: Self-pay | Admitting: Family Medicine

## 2024-01-01 DIAGNOSIS — M5116 Intervertebral disc disorders with radiculopathy, lumbar region: Secondary | ICD-10-CM | POA: Diagnosis not present

## 2024-01-01 DIAGNOSIS — M48062 Spinal stenosis, lumbar region with neurogenic claudication: Secondary | ICD-10-CM | POA: Diagnosis not present

## 2024-01-03 ENCOUNTER — Other Ambulatory Visit: Payer: Self-pay

## 2024-01-03 DIAGNOSIS — M5416 Radiculopathy, lumbar region: Secondary | ICD-10-CM

## 2024-01-07 DIAGNOSIS — H2511 Age-related nuclear cataract, right eye: Secondary | ICD-10-CM | POA: Diagnosis not present

## 2024-01-07 DIAGNOSIS — H5371 Glare sensitivity: Secondary | ICD-10-CM | POA: Diagnosis not present

## 2024-01-08 DIAGNOSIS — H2512 Age-related nuclear cataract, left eye: Secondary | ICD-10-CM | POA: Diagnosis not present

## 2024-01-10 ENCOUNTER — Encounter: Payer: Self-pay | Admitting: Internal Medicine

## 2024-01-15 DIAGNOSIS — F900 Attention-deficit hyperactivity disorder, predominantly inattentive type: Secondary | ICD-10-CM

## 2024-01-15 MED ORDER — METHYLPHENIDATE HCL ER (OSM) 36 MG PO TBCR: 36.0000 mg | EXTENDED_RELEASE_TABLET | Freq: Every day | ORAL | 0 refills | Status: DC

## 2024-01-15 NOTE — Telephone Encounter (Unsigned)
 Copied from CRM [id number]. Topic: Clinical - Medication Refill >> Jan 15, 2024 11:08 AM Jasmin G wrote: Medication: methylphenidate  (CONCERTA ) 36 MG PO CR tablet. Pt is aware that Dr. Rollene will out of office until the 17th and wants to know if another provider can refill med as she will be out in 2 days. Call pt back at  [phone] to notify.  Has the patient contacted their pharmacy? No (Agent: If no, request that the patient contact the pharmacy for the refill. If patient does not wish to contact the pharmacy document the reason why and proceed with request.) (Agent: If yes, when and what did the pharmacy advise?)  This is the patient's preferred pharmacy:  [id number] - [place], [place] - [address] AT [address] OF [facility] [address] [place] [address] Phone: [phone] Fax: [phone]  Is this the correct pharmacy for this prescription? Yes If no, delete pharmacy and type the correct one.   Has the prescription been filled recently? Yes  Is the patient out of the medication? No  Has the patient been seen for an appointment in the last year OR does the patient have an upcoming appointment? Yes  Can we respond through MyChart? No  Agent: Please be advised that Rx refills may take up to 3 business days. We ask that you follow-up with your pharmacy.

## 2024-01-18 ENCOUNTER — Other Ambulatory Visit: Payer: Self-pay | Admitting: Family

## 2024-01-18 DIAGNOSIS — F900 Attention-deficit hyperactivity disorder, predominantly inattentive type: Secondary | ICD-10-CM

## 2024-01-18 MED ORDER — METHYLPHENIDATE HCL ER (OSM) 36 MG PO TBCR: 36.0000 mg | EXTENDED_RELEASE_TABLET | Freq: Every day | ORAL | 0 refills | Status: DC

## 2024-01-30 NOTE — Progress Notes (Signed)
 " Terri [name] [address] [place] [address] Phone: [phone] Subjective:   [name], am serving as a scribe for Dr. Arthea Claudene.  I'm seeing this patient by the request  of:  [name], MD  CC: Back and neck pain follow-up  [name]  Terri [name] is a 59 y.o. female coming in with complaint of back and neck pain. OMT 12/10/2023.  Patient also did have repeat imaging of the back and we did recommend a potential.  Patient did undergo an epidural at the L4 level December 23.  Patient states that injection was helpful. Pain was 7 but is now 3/10.  Painful at the end of the day somewhat. Working on core.   Patient was told by anesthesia that she has a significant heart murmer. Recommended that she get an ECHO.   Also wants to know if she should get a DEXA scan.   Medications patient has been prescribed: Prednisone   Taking:         Reviewed prior external information including notes and imaging from previsou exam, outside providers and external EMR if available.   As well as notes that were available from care everywhere and other healthcare systems.  Past medical history, social, surgical and family history all reviewed in electronic medical record.  No pertanent information unless stated regarding to the chief complaint.   Past Medical History:  Diagnosis Date   Anxiety    Depression    Environmental allergies    GERD (gastroesophageal reflux disease)    Headache(784.0)    Recurrent sinusitis    ent and pulm eval in past.    Allergies[1]   Review of Systems:  No headache, visual changes, nausea, vomiting, diarrhea, constipation, dizziness, abdominal pain, skin rash, fevers, chills, night sweats, weight loss, swollen lymph nodes, body aches, joint swelling, chest pain, shortness of breath, mood changes. POSITIVE muscle aches  Objective  Blood pressure 124/82, pulse 85, height 5' 3 (1.6 m), weight 130 lb  (59 kg), last menstrual period 07/19/2017, SpO2 98%.   General: No apparent distress alert and oriented x3 mood and affect normal, dressed appropriately.  HEENT: Pupils equal, extraocular movements intact  Respiratory: Patient's speak in full sentences and does not appear short of breath  Cardiovascular: No lower extremity edema, non tender, no erythema patient does have a 1 out of 6 systolic ejection murmur sounds more tricuspid in nature. Gait MSK:  Hypermobility of multiple joints noted. Back tightness noted more in the thoracolumbar juncture, seems to be more left greater than right.  Difficulty with sidebending.  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  Patient is going to start working out on a more regular basis but did have 2 procedures done on her eyes that did limit her working out.  Patient also had the epidural in the back which has helped.  Responded well to manipulation.  Discussed with patient about the murmur and potentially getting an echocardiogram which patient declined.  Will continue to monitor.  Follow-up with me again in 6 to 8 weeks  Compression fracture of body of thoracic vertebra [facility]) Patient  has had a compression fracture previously.  Will get a DEXA scan to get a baseline and then depending on findings we will discuss potentially repeating image every 5 years until 16    Nonallopathic problems  Decision today to treat with OMT was based on Physical Exam  After verbal consent patient was treated with HVLA, ME, FPR techniques in cervical, rib, thoracic, lumbar, and sacral  areas  Patient tolerated the procedure well  with improvement in symptoms  Patient given exercises, stretches and lifestyle modifications  See medications in patient instructions if given  Patient will follow up in 4-8 weeks      The above documentation has been reviewed and is accurate and complete Arthea CHRISTELLA Sharps, DO        Note: This dictation was prepared with Dragon dictation along with smaller phrase technology. Any transcriptional errors that result from this process are unintentional.            [1]  Allergies Allergen Reactions   Augmentin  [Amoxicillin -Pot Clavulanate] Nausea Only   Celecoxib     REACTION: unspecified   Sulfa Antibiotics    Sulfonamide Derivatives     REACTION: hives, throat swelling   Monistat [Miconazole] Other (See Comments)    Sensitive to topical vaginal  Azoles No side effects with oral Diflucan    "

## 2024-01-31 ENCOUNTER — Encounter: Payer: Self-pay | Admitting: Family Medicine

## 2024-01-31 ENCOUNTER — Ambulatory Visit (INDEPENDENT_AMBULATORY_CARE_PROVIDER_SITE_OTHER): Admitting: Family Medicine

## 2024-01-31 VITALS — BP 124/82 | HR 85 | Ht 63.0 in | Wt 130.0 lb

## 2024-01-31 DIAGNOSIS — M9908 Segmental and somatic dysfunction of rib cage: Secondary | ICD-10-CM

## 2024-01-31 DIAGNOSIS — M9901 Segmental and somatic dysfunction of cervical region: Secondary | ICD-10-CM | POA: Diagnosis not present

## 2024-01-31 DIAGNOSIS — M9904 Segmental and somatic dysfunction of sacral region: Secondary | ICD-10-CM

## 2024-01-31 DIAGNOSIS — M940 Chondrocostal junction syndrome [Tietze]: Secondary | ICD-10-CM

## 2024-01-31 DIAGNOSIS — M9902 Segmental and somatic dysfunction of thoracic region: Secondary | ICD-10-CM | POA: Diagnosis not present

## 2024-01-31 DIAGNOSIS — M859 Disorder of bone density and structure, unspecified: Secondary | ICD-10-CM

## 2024-01-31 DIAGNOSIS — S22000A Wedge compression fracture of unspecified thoracic vertebra, initial encounter for closed fracture: Secondary | ICD-10-CM | POA: Diagnosis not present

## 2024-01-31 DIAGNOSIS — M9903 Segmental and somatic dysfunction of lumbar region: Secondary | ICD-10-CM | POA: Diagnosis not present

## 2024-01-31 NOTE — Patient Instructions (Addendum)
 Good to see you. DEXA scan at [facility] on Echo. Get back to the routine and try to get back to the exercises. See me again in 6 to 8 weeks.

## 2024-01-31 NOTE — Assessment & Plan Note (Signed)
 Discussed with patient and the hypermobility.  Patient is going to start working out on a more regular basis but did have 2 procedures done on her eyes that did limit her working out.  Patient also had the epidural in the back which has helped.  Responded well to manipulation.  Discussed with patient about the murmur and potentially getting an echocardiogram which patient declined.  Will continue to monitor.  Follow-up with me again in 6 to 8 weeks

## 2024-01-31 NOTE — Assessment & Plan Note (Signed)
 Patient has had a compression fracture previously.  Will get a DEXA scan to get a baseline and then depending on findings we will discuss potentially repeating image every 5 years until 28

## 2024-02-18 ENCOUNTER — Encounter: Payer: Self-pay | Admitting: Internal Medicine

## 2024-02-18 DIAGNOSIS — F900 Attention-deficit hyperactivity disorder, predominantly inattentive type: Secondary | ICD-10-CM

## 2024-02-19 MED ORDER — METHYLPHENIDATE HCL ER (OSM) 36 MG PO TBCR: 36.0000 mg | EXTENDED_RELEASE_TABLET | Freq: Every day | ORAL | 0 refills | Status: AC

## 2024-03-11 ENCOUNTER — Ambulatory Visit: Admitting: Family Medicine

## 2024-03-20 ENCOUNTER — Other Ambulatory Visit

## 2024-04-17 ENCOUNTER — Ambulatory Visit: Admitting: Family Medicine
